# Patient Record
Sex: Male | Born: 1990 | Race: White | Hispanic: No | Marital: Single | Smoking: Current every day smoker
Health system: Southern US, Community
[De-identification: ages and names within clinical notes are randomized; demographics above are authoritative.]

## PROBLEM LIST (undated history)

## (undated) ENCOUNTER — Emergency Department (HOSPITAL_COMMUNITY): Admission: EM | Payer: 59 | Source: Home / Self Care

## (undated) ENCOUNTER — Emergency Department (HOSPITAL_COMMUNITY): Payer: 59

## (undated) ENCOUNTER — Ambulatory Visit: Admission: EM | Payer: Self-pay | Source: Home / Self Care

## (undated) DIAGNOSIS — F419 Anxiety disorder, unspecified: Secondary | ICD-10-CM

## (undated) DIAGNOSIS — F32A Depression, unspecified: Secondary | ICD-10-CM

## (undated) DIAGNOSIS — F4310 Post-traumatic stress disorder, unspecified: Secondary | ICD-10-CM

## (undated) DIAGNOSIS — M419 Scoliosis, unspecified: Secondary | ICD-10-CM

## (undated) DIAGNOSIS — A4902 Methicillin resistant Staphylococcus aureus infection, unspecified site: Secondary | ICD-10-CM

## (undated) HISTORY — DX: Depression, unspecified: F32.A

## (undated) HISTORY — DX: Post-traumatic stress disorder, unspecified: F43.10

## (undated) HISTORY — DX: Anxiety disorder, unspecified: F41.9

---
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
## 2010-10-30 LAB — BASIC METABOLIC PANEL
CO2: 25
Chloride: 104
Glucose, Bld: 104 — ABNORMAL HIGH
Sodium: 140

## 2010-10-30 LAB — RAPID URINE DRUG SCREEN, HOSP PERFORMED
Cocaine: NOT DETECTED
Tetrahydrocannabinol: POSITIVE — AB

## 2010-10-30 LAB — CBC
HCT: 46.1
Hemoglobin: 15.7
MCHC: 34
MCV: 86.6
RBC: 5.33

## 2010-10-30 LAB — DIFFERENTIAL
Basophils Relative: 0
Eosinophils Absolute: 0.3
Eosinophils Relative: 3
Monocytes Absolute: 1.1
Monocytes Relative: 8

## 2011-10-11 ENCOUNTER — Emergency Department (HOSPITAL_COMMUNITY)
Admission: EM | Admit: 2011-10-11 | Discharge: 2011-10-11 | Disposition: A | Discharge status: Home / Self Care | Payer: Medicaid Other | Attending: Emergency Medicine | Admitting: Emergency Medicine

## 2011-10-11 ENCOUNTER — Other Ambulatory Visit: Payer: Self-pay

## 2011-10-11 ENCOUNTER — Encounter (HOSPITAL_COMMUNITY): Payer: Self-pay | Admitting: Physical Medicine and Rehabilitation

## 2011-10-11 ENCOUNTER — Emergency Department (HOSPITAL_COMMUNITY): Payer: Medicaid Other

## 2011-10-11 DIAGNOSIS — R079 Chest pain, unspecified: Secondary | ICD-10-CM | POA: Insufficient documentation

## 2011-10-11 DIAGNOSIS — F17200 Nicotine dependence, unspecified, uncomplicated: Secondary | ICD-10-CM | POA: Insufficient documentation

## 2011-10-11 LAB — CBC WITH DIFFERENTIAL/PLATELET
Basophils Absolute: 0.1 10*3/uL (ref 0.0–0.1)
HCT: 44.3 % (ref 39.0–52.0)
Hemoglobin: 15.6 g/dL (ref 13.0–17.0)
Lymphocytes Relative: 19 % (ref 12–46)
Monocytes Absolute: 1.2 10*3/uL — ABNORMAL HIGH (ref 0.1–1.0)
Neutro Abs: 11.1 10*3/uL — ABNORMAL HIGH (ref 1.7–7.7)
RBC: 4.96 MIL/uL (ref 4.22–5.81)
RDW: 12.8 % (ref 11.5–15.5)
WBC: 16 10*3/uL — ABNORMAL HIGH (ref 4.0–10.5)

## 2011-10-11 LAB — BASIC METABOLIC PANEL
CO2: 29 mEq/L (ref 19–32)
Chloride: 103 mEq/L (ref 96–112)
Creatinine, Ser: 0.94 mg/dL (ref 0.50–1.35)

## 2011-10-11 LAB — RAPID URINE DRUG SCREEN, HOSP PERFORMED
Amphetamines: NOT DETECTED
Benzodiazepines: POSITIVE — AB
Opiates: POSITIVE — AB

## 2011-10-11 LAB — URINALYSIS, ROUTINE W REFLEX MICROSCOPIC
Glucose, UA: NEGATIVE mg/dL
Hgb urine dipstick: NEGATIVE
Ketones, ur: 15 mg/dL — AB
Protein, ur: NEGATIVE mg/dL

## 2011-10-11 LAB — D-DIMER, QUANTITATIVE: D-Dimer, Quant: <0.22 ug/mL-FEU (ref 0.00–0.48)

## 2011-10-11 MED ORDER — HYDROCODONE-ACETAMINOPHEN 5-325 MG PO TABS
2.0000 | ORAL_TABLET | Freq: Once | ORAL | Status: AC
  Administered 2011-10-11: 2 via ORAL
  Filled 2011-10-11: qty 2

## 2011-10-11 NOTE — ED Notes (Signed)
Patient transported to X-ray 

## 2011-10-11 NOTE — ED Notes (Signed)
Discharge instruction given pt verbalized understanding that smoking can cause the pain that he discribed , pt state pain that occurred after coughing episode and he admits to smoking cigarettes and marijuania.

## 2011-10-11 NOTE — ED Notes (Signed)
Pt presents to department for evaluation of diffuse chest pain radiating to back. Onset this morning. States pain becomes worse with movement and palpation. 8/10 pain at the time. Respirations unlabored. Skin warm and dry. Pt is alert and oriented x4. No signs of distress noted.

## 2011-10-11 NOTE — ED Provider Notes (Signed)
History     CSN: [id number]  Arrival date & time 10/11/11  1457   First MD Initiated Contact with Patient 10/11/11 1923      Chief Complaint  Patient presents with  . Chest Pain    (Consider location/radiation/quality/duration/timing/severity/associated sxs/prior treatment) HPI Comments: Roger [name] 21 y.o. male   The chief complaint is: Patient presents with:   Chest Pain   The patient has medical history significant for:   No past medical history on file.  Patient presents with mid sternal CP that began last night. Patient states that the pain is mid sternal, 9/10, and worse when he lays down. Associated symptoms include transmission to the back and left shoulder pain. Patient states that he drinks alcohol and smokes marijuana regularly, but denies cocaine, other illicit drugs, or IV drug use. Denies fever or chills. Denies cough, congestion, or rhinorrhea, Denies NVD or abdominal pain.       The history is provided by the patient.    No past medical history on file.  No past surgical history on file.  History reviewed. No pertinent family history.  History  Substance Use Topics  . Smoking status: Current Everyday Smoker -- 1.0 packs/day    Types: Cigarettes  . Smokeless tobacco: Not on file  . Alcohol Use: Yes      Review of Systems  Constitutional: Negative for fever and chills.  HENT: Negative for congestion and rhinorrhea.   Respiratory: Negative for cough and shortness of breath.   Cardiovascular: Positive for chest pain.  Gastrointestinal: Negative for nausea, vomiting, abdominal pain and diarrhea.  Musculoskeletal: Positive for gait problem.  All other systems reviewed and are negative.    Allergies  Review of patient's allergies indicates no known allergies.  Home Medications  No current outpatient prescriptions on file.  BP 128/79  Pulse 60  Temp 98.5 F (36.9 C) (Oral)  Resp 19  SpO2 100%  Physical Exam  Nursing note and  vitals reviewed. Constitutional: He appears well-developed and well-nourished. No distress.  HENT:  Head: Normocephalic and atraumatic.  Mouth/Throat: Oropharynx is clear and moist.  Eyes: Conjunctivae and EOM are normal. Pupils are equal, round, and reactive to light. No scleral icterus.  Neck: Normal range of motion. Neck supple.  Cardiovascular: Normal rate, regular rhythm and normal heart sounds.   Pulmonary/Chest: Effort normal and breath sounds normal.       CP is reproducible on exam.  Abdominal: Soft. Bowel sounds are normal. There is no tenderness.  Neurological: He is alert.  Skin: Skin is warm and dry.    ED Course  Procedures (including critical care time)  Labs Reviewed - No data to display Dg Chest 2 View  10/11/2011  *RADIOLOGY REPORT*  Clinical Data: Pleuritic chest pain.  CHEST - 2 VIEW  Comparison:  05/14/2009  Findings:  The heart size and mediastinal contours are within normal limits.  Both lungs are clear.  No evidence of pneumothorax or pleural effusion.  The visualized skeletal structures are unremarkable.  IMPRESSION: No active disease.   Original Report Authenticated By: Danae Orleans, M.D.      1. Chest pain       MDM  Patient presented with CP that began last night. Pain radiated to back and left shoulder. Patient given pain medication in ED, with improvement. CBC: unremarkable BMP: unremarkable D-dimer:negative CXR: negative. EKG: unremarkable. Patient reassessed and CP improved. This pain is most due to drug use. Patient counseled on not taking pills that  are not prescribed to him. Patient discharged.  No red flags for PE or pneumothorax. Return precautions given verbally and in discharge summary.        Pixie Casino, PA-C 10/11/11 2229

## 2011-10-12 NOTE — ED Provider Notes (Signed)
Medical screening examination/treatment/procedure(s) were conducted as a shared visit with non-physician practitioner(s) and myself.  I personally evaluated the patient during the encounter  Substernal chest pain since last night. Worse with lying down. Radiates to back. Lungs clear, heart regular. Atypical for ACS, chest x-ray negative, d-dimer negative.  Glynn Octave, MD 10/12/11 [phone]

## 2012-08-07 ENCOUNTER — Encounter (HOSPITAL_COMMUNITY): Payer: Self-pay | Admitting: Emergency Medicine

## 2012-08-07 ENCOUNTER — Emergency Department (HOSPITAL_COMMUNITY)
Admission: EM | Admit: 2012-08-07 | Discharge: 2012-08-08 | Disposition: A | Discharge status: Home / Self Care | Payer: Self-pay | Attending: Emergency Medicine | Admitting: Emergency Medicine

## 2012-08-07 DIAGNOSIS — M25512 Pain in left shoulder: Secondary | ICD-10-CM

## 2012-08-07 DIAGNOSIS — Z87828 Personal history of other (healed) physical injury and trauma: Secondary | ICD-10-CM | POA: Insufficient documentation

## 2012-08-07 DIAGNOSIS — M25519 Pain in unspecified shoulder: Secondary | ICD-10-CM | POA: Insufficient documentation

## 2012-08-07 DIAGNOSIS — Z87891 Personal history of nicotine dependence: Secondary | ICD-10-CM | POA: Insufficient documentation

## 2012-08-07 NOTE — ED Notes (Signed)
Pt c/o left shoulder pain. States he has hx of shoulder dislocation and pain since December 2013.

## 2012-08-08 MED ORDER — IBUPROFEN 800 MG PO TABS
800.0000 mg | ORAL_TABLET | Freq: Once | ORAL | Status: AC
  Administered 2012-08-08: 800 mg via ORAL
  Filled 2012-08-08: qty 1

## 2012-08-08 NOTE — ED Notes (Signed)
Left shoulder has been injured several times - no new injury - has tried tylenol and advil without relief.

## 2012-08-08 NOTE — ED Provider Notes (Signed)
   History    CSN: [id number] Arrival date & time 08/07/12  2348  First MD Initiated Contact with Patient 08/08/12 0010     Chief Complaint  Patient presents with  . Shoulder Pain   (Consider location/radiation/quality/duration/timing/severity/associated sxs/prior Treatment) HPI HPI Comments: Roger [name] is a 22 y.o. male who presents to the Emergency Department complaining of left shoulder pain. He has a h/o dislocation of the left shoulder and is scheduled to have PT in [place] next week. The shoulder hurts with movement and in certain positions. He is able to shrug his shoulders but to hold his hand above his head is painful.   History reviewed. No pertinent past medical history. History reviewed. No pertinent past surgical history. No family history on file. History  Substance Use Topics  . Smoking status: Former Smoker -- 1.00 packs/day    Types: Cigarettes  . Smokeless tobacco: Not on file  . Alcohol Use: Yes    Review of Systems  Constitutional: Negative for fever.       10 Systems reviewed and are negative for acute change except as noted in the HPI.  HENT: Negative for congestion.   Eyes: Negative for discharge and redness.  Respiratory: Negative for cough and shortness of breath.   Cardiovascular: Negative for chest pain.  Gastrointestinal: Negative for vomiting and abdominal pain.  Musculoskeletal: Negative for back pain.       Shoulder pain  Skin: Negative for rash.  Neurological: Negative for syncope, numbness and headaches.  Psychiatric/Behavioral:       No behavior change.    Allergies  Review of patient's allergies indicates no known allergies.  Home Medications  No current outpatient prescriptions on file. BP 130/77  Pulse 86  Temp(Src) 97.9 F (36.6 C) (Oral)  Resp 20  Ht 5\' 6"  (1.676 m)  Wt 160 lb (72.576 kg)  BMI 25.84 kg/m2  SpO2 100% Physical Exam  Nursing note and vitals reviewed. Constitutional: He appears well-developed and  well-nourished.  Awake, alert, nontoxic appearance.  HENT:  Head: Normocephalic and atraumatic.  Eyes: EOM are normal. Pupils are equal, round, and reactive to light.  Neck: Neck supple.  Cardiovascular: Normal rate and intact distal pulses.   Pulmonary/Chest: Effort normal and breath sounds normal. He exhibits no tenderness.  Abdominal: Soft. Bowel sounds are normal. There is no tenderness. There is no rebound.  Musculoskeletal: He exhibits no tenderness.  Baseline ROM, no obvious new focal weakness.Left shoulder with tenderness to palpation. FROM with discomfort.   Neurological:  Mental status and motor strength appears baseline for patient and situation.  Skin: No rash noted.  Psychiatric: He has a normal mood and affect.    ED Course  Procedures (including critical care time)   MDM  Patient with shoulder pain c/w torn rotator cuff. He is scheduled for PT this coming week. Discussed the use of ibuprofen for pain management. Pt stable in ED with no significant deterioration in condition.The patient appears reasonably screened and/or stabilized for discharge and I doubt any other medical condition or other [facility] requiring further screening, evaluation, or treatment in the ED at this time prior to discharge.  MDM Reviewed: nursing note and vitals     Nicoletta Dress. Colon Branch, MD 08/08/12 [phone]

## 2012-08-08 NOTE — ED Notes (Signed)
Patient sleeping soundly - awakened to give him the pain medication as ordered

## 2012-08-10 ENCOUNTER — Emergency Department (HOSPITAL_COMMUNITY)
Admission: EM | Admit: 2012-08-10 | Discharge: 2012-08-10 | Disposition: A | Discharge status: Home / Self Care | Payer: BC Managed Care – PPO | Attending: Emergency Medicine | Admitting: Emergency Medicine

## 2012-08-10 ENCOUNTER — Emergency Department (HOSPITAL_COMMUNITY): Payer: BC Managed Care – PPO

## 2012-08-10 ENCOUNTER — Encounter (HOSPITAL_COMMUNITY): Payer: Self-pay | Admitting: *Deleted

## 2012-08-10 DIAGNOSIS — M21829 Other specified acquired deformities of unspecified upper arm: Secondary | ICD-10-CM

## 2012-08-10 DIAGNOSIS — Y929 Unspecified place or not applicable: Secondary | ICD-10-CM | POA: Insufficient documentation

## 2012-08-10 DIAGNOSIS — S43016A Anterior dislocation of unspecified humerus, initial encounter: Secondary | ICD-10-CM | POA: Insufficient documentation

## 2012-08-10 DIAGNOSIS — S43005A Unspecified dislocation of left shoulder joint, initial encounter: Secondary | ICD-10-CM

## 2012-08-10 DIAGNOSIS — W010XXA Fall on same level from slipping, tripping and stumbling without subsequent striking against object, initial encounter: Secondary | ICD-10-CM | POA: Insufficient documentation

## 2012-08-10 DIAGNOSIS — Y93E1 Activity, personal bathing and showering: Secondary | ICD-10-CM | POA: Insufficient documentation

## 2012-08-10 DIAGNOSIS — Z87891 Personal history of nicotine dependence: Secondary | ICD-10-CM | POA: Insufficient documentation

## 2012-08-10 MED ORDER — MIDAZOLAM HCL 5 MG/5ML IJ SOLN
4.0000 mg | Freq: Once | INTRAMUSCULAR | Status: AC
  Administered 2012-08-10: 2 mg via INTRAVENOUS
  Filled 2012-08-10: qty 5

## 2012-08-10 MED ORDER — PROPOFOL 10 MG/ML IV BOLUS
INTRAVENOUS | Status: AC
  Administered 2012-08-10: 50 mg via INTRAVENOUS
  Filled 2012-08-10: qty 1

## 2012-08-10 MED ORDER — NAPROXEN 500 MG PO TABS: 500.0000 mg | ORAL_TABLET | Freq: Two times a day (BID) | ORAL | Status: DC

## 2012-08-10 MED ORDER — PROPOFOL 10 MG/ML IV BOLUS
1.0000 mg/kg | Freq: Once | INTRAVENOUS | Status: AC
  Administered 2012-08-10: 50 mg via INTRAVENOUS

## 2012-08-10 MED ORDER — TRAMADOL HCL 50 MG PO TABS: 50.0000 mg | ORAL_TABLET | Freq: Four times a day (QID) | ORAL | Status: DC | PRN

## 2012-08-10 MED ORDER — FENTANYL CITRATE 0.05 MG/ML IJ SOLN
100.0000 ug | Freq: Once | INTRAMUSCULAR | Status: AC
  Administered 2012-08-10: 100 ug via INTRAVENOUS
  Filled 2012-08-10: qty 2

## 2012-08-10 NOTE — ED Notes (Signed)
MD at the bedside to discuss follow up xray, pt talking,vitals are stable

## 2012-08-10 NOTE — ED Notes (Signed)
Pt states slipped in shower & complaining of left shoulder being dislocated. 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

## 2012-08-10 NOTE — ED Notes (Signed)
Pt ambulatory out of the dept with girlfriend, no complaints

## 2012-08-10 NOTE — ED Provider Notes (Signed)
History    CSN: [id number] Arrival date & time 08/10/12  0107  First MD Initiated Contact with Patient 08/10/12 0110     Chief Complaint  Patient presents with  . Shoulder Injury   (Consider location/radiation/quality/duration/timing/severity/associated sxs/prior Treatment) HPI Comments:  22 year old male who states that he has had a total of 4 shoulder dislocations in the past including this evening. He endorses being in the shower this evening, slipped trying to get out and fell onto his left shoulder causing acute onset of pain and deformity which has been persistent, severe, worse with range of motion but not associated with numbness or tingling of the fingers. Never had surgery on the shoulder but has required procedural sedation for reduction in the past.  Patient is a 22 y.o. male presenting with shoulder injury. The history is provided by the patient.  Shoulder Injury   History reviewed. No pertinent past medical history. History reviewed. No pertinent past surgical history. History reviewed. No pertinent family history. History  Substance Use Topics  . Smoking status: Former Smoker -- 1.00 packs/day    Types: Cigarettes  . Smokeless tobacco: Not on file  . Alcohol Use: Yes    Review of Systems  Musculoskeletal: Positive for joint swelling.  Skin: Negative for wound.  Neurological: Negative for weakness and numbness.    Allergies  Review of patient's allergies indicates no known allergies.  Home Medications   Current Outpatient Rx  Name  Route  Sig  Dispense  Refill  . naproxen (NAPROSYN) 500 MG tablet   Oral   Take 1 tablet (500 mg total) by mouth 2 (two) times daily with a meal.   30 tablet   0   . traMADol (ULTRAM) 50 MG tablet   Oral   Take 1 tablet (50 mg total) by mouth every 6 (six) hours as needed for pain.   15 tablet   0    BP 118/69  Pulse 58  Temp(Src) 98 F (36.7 C) (Oral)  Resp 18  Ht 5\' 6"  (1.676 m)  Wt 160 lb (72.576 kg)  BMI  25.84 kg/m2  SpO2 96% Physical Exam  Nursing note and vitals reviewed. Constitutional: He appears well-developed and well-nourished. No distress.  HENT:  Head: Normocephalic and atraumatic.  Mouth/Throat: Oropharynx is clear and moist. No oropharyngeal exudate.  Eyes: Conjunctivae and EOM are normal. Pupils are equal, round, and reactive to light. Right eye exhibits no discharge. Left eye exhibits no discharge. No scleral icterus.  Neck: Normal range of motion. Neck supple. No JVD present. No thyromegaly present.  Cardiovascular: Normal rate, regular rhythm, normal heart sounds and intact distal pulses.  Exam reveals no gallop and no friction rub.   No murmur heard. Pulmonary/Chest: Effort normal and breath sounds normal. No respiratory distress. He has no wheezes. He has no rales.  Abdominal: Soft. Bowel sounds are normal. He exhibits no distension and no mass. There is no tenderness.  Musculoskeletal: Normal range of motion. He exhibits tenderness (Moderate to severe tenderness associated with deformity of the left shoulder, decreased range of motion secondary to severe pain, normal pulses and sensation at the left hand). He exhibits no edema.  Lymphadenopathy:    He has no cervical adenopathy.  Neurological: He is alert. Coordination normal.  Skin: Skin is warm and dry. No rash noted. No erythema.  Psychiatric: He has a normal mood and affect. His behavior is normal.    ED Course  Procedures (including critical care time) Labs Reviewed - No  data to display Dg Shoulder Left  08/10/2012   *RADIOLOGY REPORT*  Clinical Data: Postreduction left shoulder.  LEFT SHOULDER - 2+ VIEW  Comparison: 08/10/2012 at 0130 hours.  Findings: Interval reduction of previously left shoulder dislocation.  Hill-Sachs deformity is present.  IMPRESSION: Interval relocation of the left shoulder.  Hill-Sachs deformity demonstrated.   Original Report Authenticated By: Burman Nieves, M.D.   Dg Shoulder  Left  08/10/2012   *RADIOLOGY REPORT*  Clinical Data: Pain and deformity of the left shoulder.  Previous dislocations.  LEFT SHOULDER - 2+ VIEW  Comparison: 02/02/2012  Findings: The anterior dislocation of the left shoulder with Hill- Sachs deformity of the lateral humeral head.  No focal bone lesion demonstrated.  No displaced fractures.  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   Original Report Authenticated By: Burman Nieves, M.D.   1. Dislocation of left shoulder joint, initial encounter   2. Hill Sachs deformity     MDM  The patient appears uncomfortable, he has a likely dislocated left shoulder, this is recurrent, he refuses to allow me to reduce the shoulder manually without sedation, pain medication ordered, x-ray pending.  Pt required procedural sedation for reduction  Procedure Note:      Dislocation Reduction  Date, Time: August 10 2012  2:48 AM  Indication: Dislocation of  the left shoulder(s) The patient has expressed understanding of the procedure being preformed Risks Benefits and alternatives of the procedure were give to the patient Written Consent obtained from patient Imaging results available showing:  Left anterior shoulder dislocation with Hill-Sachs deformity Site Marked yes  Time out called immediately prior to procedure Patient was placed in semifowler position Medicines used 2 mg of Versed, 50 mg of propofol Method used traction, countertraction Immobilization applied post reduction Post reduction radiographs successful reduction of dislocation Patient tolerated procedure without any immediate complications. Approximate time of physician involvement in procedure 15 minutes Well tolerated by patient.  Procedural sedation Performed by: Vida Roller Consent: Verbal consent obtained. Risks and benefits: risks, benefits and alternatives were discussed Required items: required blood products, implants, devices, and  special equipment available Patient identity confirmed: arm band and provided demographic data Time out: Immediately prior to procedure a "time out" was called to verify the correct patient, procedure, equipment, support staff and site/side marked as required.  Sedation type: moderate (conscious) sedation NPO time confirmed and considedered  Sedatives: PROPOFOL, Versed  Physician Time at Bedside: 15 minutes  Vitals: Vital signs were monitored during sedation. Cardiac Monitor, pulse oximeter Patient tolerance: Patient tolerated the procedure well with no immediate complications. Comments: Pt with uneventful recovered. Returned to pre-procedural sedation baseline    Meds given in ED:  Medications  fentaNYL (SUBLIMAZE) injection 100 mcg (100 mcg Intravenous Given 08/10/12 0129)  midazolam (VERSED) 5 MG/5ML injection 4 mg (2 mg Intravenous Given 08/10/12 0224)  propofol (DIPRIVAN) 10 mg/mL bolus/IV push 72.6 mg (50 mg Intravenous New Bag/Given 08/10/12 0229)    New Prescriptions   NAPROXEN (NAPROSYN) 500 MG TABLET    Take 1 tablet (500 mg total) by mouth 2 (two) times daily with a meal.   TRAMADOL (ULTRAM) 50 MG TABLET    Take 1 tablet (50 mg total) by mouth every 6 (six) hours as needed for pain.        Vida Roller, MD 08/10/12 [phone]

## 2012-08-10 NOTE — ED Notes (Signed)
Pt drinking ice water with out difficulty. Per girlfriend pt is talking normal. Pt denies pain.

## 2012-11-19 ENCOUNTER — Ambulatory Visit: Payer: BC Managed Care – PPO | Admitting: Family Medicine

## 2013-01-06 ENCOUNTER — Ambulatory Visit: Payer: BC Managed Care – PPO | Admitting: Family Medicine

## 2013-03-03 ENCOUNTER — Emergency Department (HOSPITAL_COMMUNITY)
Admission: EM | Admit: 2013-03-03 | Discharge: 2013-03-03 | Disposition: A | Discharge status: Home / Self Care | Payer: BC Managed Care – PPO | Attending: Emergency Medicine | Admitting: Emergency Medicine

## 2013-03-03 ENCOUNTER — Encounter (HOSPITAL_COMMUNITY): Payer: Self-pay | Admitting: Emergency Medicine

## 2013-03-03 DIAGNOSIS — F17200 Nicotine dependence, unspecified, uncomplicated: Secondary | ICD-10-CM | POA: Insufficient documentation

## 2013-03-03 DIAGNOSIS — L03317 Cellulitis of buttock: Principal | ICD-10-CM

## 2013-03-03 DIAGNOSIS — Z8614 Personal history of Methicillin resistant Staphylococcus aureus infection: Secondary | ICD-10-CM | POA: Insufficient documentation

## 2013-03-03 DIAGNOSIS — L0231 Cutaneous abscess of buttock: Secondary | ICD-10-CM | POA: Insufficient documentation

## 2013-03-03 HISTORY — DX: Methicillin resistant Staphylococcus aureus infection, unspecified site: A49.02

## 2013-03-03 MED ORDER — LIDOCAINE HCL (PF) 2 % IJ SOLN
INTRAMUSCULAR | Status: AC
  Filled 2013-03-03: qty 10

## 2013-03-03 MED ORDER — SULFAMETHOXAZOLE-TRIMETHOPRIM 800-160 MG PO TABS: 2.0000 | ORAL_TABLET | Freq: Two times a day (BID) | ORAL | Status: DC

## 2013-03-03 MED ORDER — HYDROCODONE-ACETAMINOPHEN 5-325 MG PO TABS: 1.0000 | ORAL_TABLET | Freq: Four times a day (QID) | ORAL | Status: DC | PRN

## 2013-03-03 NOTE — ED Notes (Addendum)
Patient c/o abscess to left buttock x4 days. Per patient bloody drainage. Denies any fevers.

## 2013-03-03 NOTE — Discharge Instructions (Signed)
Return here 2 days for recheck.  Keep area covered and clean around the area.

## 2013-03-03 NOTE — ED Provider Notes (Signed)
CSN: [id number]     Arrival date & time 03/03/13  1331 History   First MD Initiated Contact with Patient 03/03/13 1347     Chief Complaint  Patient presents with  . Abscess   (Consider location/radiation/quality/duration/timing/severity/associated sxs/prior Treatment) HPI Patient presents emergency department with an abscess to his left buttocks.  The patient, states he noted.  Area 4 days, ago, and over the last 3 days, more swollen.  It started draining a yellowish, bloody material, yesterday.  Patient, states, that he did not take any medications prior to arrival.  Patient, states, that he's had a history of MRSA in the past.  Patient denies fever, nausea, vomiting, back pain, diarrhea, or weakness.  The patient, states, that nothing seems to make his condition, better or palpation and sitting make his pain, worse Past Medical History  Diagnosis Date  . MRSA (methicillin resistant Staphylococcus aureus)    History reviewed. No pertinent past surgical history. Family History  Problem Relation Age of Onset  . Diabetes Mother    History  Substance Use Topics  . Smoking status: Current Every Day Smoker -- 1.00 packs/day for 13 years    Types: Cigarettes  . Smokeless tobacco: Never Used  . Alcohol Use: Yes     Comment: occasionally    Review of Systems All other systems negative except as documented in the HPI. All pertinent positives and negatives as reviewed in the HPI. Allergies  Review of patient's allergies indicates no known allergies.  Home Medications  No current outpatient prescriptions on file. BP 135/86  Pulse 80  Temp(Src) 98.1 F (36.7 C) (Oral)  Resp 18  Ht 5\' 6"  (1.676 m)  Wt 169 lb (76.658 kg)  BMI 27.29 kg/m2  SpO2 99% Physical Exam  Nursing note and vitals reviewed. Constitutional: He appears well-developed and well-nourished. No distress.  HENT:  Head: Normocephalic and atraumatic.  Eyes: Pupils are equal, round, and reactive to light.  Neck: Normal  range of motion. Neck supple.  Pulmonary/Chest: Effort normal.  Neurological: He is alert.  Skin: Skin is warm and dry. No rash noted. No erythema.       ED Course  Procedures (including critical care time) INCISION AND DRAINAGE Performed by: Carlyle DollyLAWYER,Rebbeca Sheperd W Consent: Verbal consent obtained. Risks and benefits: risks, benefits and alternatives were discussed Type: abscess  Body area: Left buttock mid  Anesthesia: local infiltration  Incision was made with a scalpel.  Local anesthetic: lidocaine 2*% without epinephrine  Anesthetic total: 10 ml  Complexity: complex Blunt dissection to break up loculations  Drainage: purulent  Drainage amount: Large   Packing material: 1/4 in iodoform gauze  Patient tolerance: Patient tolerated the procedure well with no immediate complications.    Patient is advised to return here in 2 days for recheck.  To keep the area covered, clean and dry    Carlyle DollyChristopher W Rossanna Spitzley, PA-C 03/03/13 1439

## 2013-03-04 NOTE — ED Provider Notes (Signed)
Medical screening examination/treatment/procedure(s) were performed by non-physician practitioner and as supervising physician I was immediately available for consultation/collaboration.  EKG Interpretation   None         Shelda JakesScott W. Adalaide Jaskolski, MD 03/04/13 [phone]

## 2013-06-15 ENCOUNTER — Emergency Department (HOSPITAL_COMMUNITY): Admission: EM | Admit: 2013-06-15 | Discharge: 2013-06-15 | Discharge status: Against Medical Advice | Payer: Self-pay

## 2013-06-20 ENCOUNTER — Encounter (HOSPITAL_COMMUNITY): Payer: Self-pay | Admitting: Emergency Medicine

## 2013-06-20 ENCOUNTER — Emergency Department (HOSPITAL_COMMUNITY)
Admission: EM | Admit: 2013-06-20 | Discharge: 2013-06-20 | Disposition: A | Discharge status: Home / Self Care | Payer: Self-pay | Attending: Emergency Medicine | Admitting: Emergency Medicine

## 2013-06-20 ENCOUNTER — Emergency Department (HOSPITAL_COMMUNITY): Payer: Self-pay

## 2013-06-20 DIAGNOSIS — Y939 Activity, unspecified: Secondary | ICD-10-CM | POA: Insufficient documentation

## 2013-06-20 DIAGNOSIS — S40019A Contusion of unspecified shoulder, initial encounter: Secondary | ICD-10-CM | POA: Insufficient documentation

## 2013-06-20 DIAGNOSIS — F17200 Nicotine dependence, unspecified, uncomplicated: Secondary | ICD-10-CM | POA: Insufficient documentation

## 2013-06-20 DIAGNOSIS — S40012A Contusion of left shoulder, initial encounter: Secondary | ICD-10-CM

## 2013-06-20 DIAGNOSIS — Z8614 Personal history of Methicillin resistant Staphylococcus aureus infection: Secondary | ICD-10-CM | POA: Insufficient documentation

## 2013-06-20 DIAGNOSIS — W1809XA Striking against other object with subsequent fall, initial encounter: Secondary | ICD-10-CM | POA: Insufficient documentation

## 2013-06-20 DIAGNOSIS — Z792 Long term (current) use of antibiotics: Secondary | ICD-10-CM | POA: Insufficient documentation

## 2013-06-20 DIAGNOSIS — S20229A Contusion of unspecified back wall of thorax, initial encounter: Secondary | ICD-10-CM | POA: Insufficient documentation

## 2013-06-20 DIAGNOSIS — Y929 Unspecified place or not applicable: Secondary | ICD-10-CM | POA: Insufficient documentation

## 2013-06-20 MED ORDER — CYCLOBENZAPRINE HCL 10 MG PO TABS: 10.0000 mg | ORAL_TABLET | Freq: Two times a day (BID) | ORAL | Status: DC | PRN

## 2013-06-20 MED ORDER — IBUPROFEN 800 MG PO TABS: 800.0000 mg | ORAL_TABLET | Freq: Three times a day (TID) | ORAL | Status: DC

## 2013-06-20 NOTE — ED Notes (Signed)
Fell 1 week ago, pain low back and lt shoulder.

## 2013-06-20 NOTE — ED Notes (Signed)
Pt with left shoulder and back pain since a fall last week ago, pt landed straight back on back per pt and hit back of head, pt denies LOC, pt also states hx of left shoulder dislocation, pt denies taking anything for the pain but used a sling for first couple of days

## 2013-06-20 NOTE — ED Provider Notes (Signed)
Medical screening examination/treatment/procedure(s) were performed by non-physician practitioner and as supervising physician I was immediately available for consultation/collaboration.  Tessy Pawelski T Isidora Laham, MD 06/20/13 2314 

## 2013-06-20 NOTE — ED Provider Notes (Signed)
CSN: [id number]     Arrival date & time 06/20/13  1957 History   First MD Initiated Contact with Patient 06/20/13 2051     Chief Complaint  Patient presents with  . Fall     (Consider location/radiation/quality/duration/timing/severity/associated sxs/prior Treatment) Patient is a 23 y.o. male presenting with fall. The history is provided by the patient. No language interpreter was used.  Fall This is a new problem. The current episode started today. The problem occurs constantly. The problem has been gradually worsening. Associated symptoms include myalgias. Pertinent negatives include no joint swelling. Nothing aggravates the symptoms. He has tried nothing for the symptoms. The treatment provided moderate relief.  Pt fell one week ago.   Pt hit low back.   Pt complains of pain in his back and in his shoulder.    Past Medical History  Diagnosis Date  . MRSA (methicillin resistant Staphylococcus aureus)    History reviewed. No pertinent past surgical history. Family History  Problem Relation Age of Onset  . Diabetes Mother    History  Substance Use Topics  . Smoking status: Current Every Day Smoker -- 1.00 packs/day for 13 years    Types: Cigarettes  . Smokeless tobacco: Never Used  . Alcohol Use: Yes     Comment: occasionally    Review of Systems  Musculoskeletal: Positive for myalgias. Negative for joint swelling.  All other systems reviewed and are negative.     Allergies  Review of patient's allergies indicates no known allergies.  Home Medications   Prior to Admission medications   Medication Sig Start Date End Date Taking? Authorizing Provider  HYDROcodone-acetaminophen (NORCO/VICODIN) 5-325 MG per tablet Take 1 tablet by mouth every 6 (six) hours as needed for moderate pain. 03/03/13   Jamesetta Orleanshristopher W Lawyer, PA-C  sulfamethoxazole-trimethoprim (SEPTRA DS) 800-160 MG per tablet Take 2 tablets by mouth 2 (two) times daily. 03/03/13   Jamesetta Orleanshristopher W Lawyer, PA-C   BP  131/83  Pulse 92  Temp(Src) 99.2 F (37.3 C) (Oral)  Resp 24  Ht 5\' 6"  (1.676 m)  Wt 170 lb (77.111 kg)  BMI 27.45 kg/m2  SpO2 97% Physical Exam  Nursing note and vitals reviewed. Constitutional: He is oriented to person, place, and time. He appears well-developed and well-nourished.  HENT:  Head: Normocephalic and atraumatic.  Eyes: EOM are normal. Pupils are equal, round, and reactive to light.  Neck: Normal range of motion.  Cardiovascular: Normal rate.   Pulmonary/Chest: Effort normal.  Abdominal: Soft. He exhibits no distension.  Musculoskeletal:  Tender left shoulder.  Decreased range of motion Tender lower lumbar and sacral spine  Neurological: He is alert and oriented to person, place, and time.  Skin: Skin is warm.  Psychiatric: He has a normal mood and affect.    ED Course  Procedures (including critical care time) Labs Review Labs Reviewed - No data to display  Imaging Review No results found.   EKG Interpretation None      MDM   Final diagnoses:  Contusion, back  Contusion of left shoulder    Ibuprofen flexeril    Elson AreasLeslie K Jennika Ringgold, PA-C 06/20/13 2205

## 2013-06-20 NOTE — Discharge Instructions (Signed)

## 2013-06-28 ENCOUNTER — Telehealth: Payer: Self-pay | Admitting: Family Medicine

## 2013-06-28 NOTE — Telephone Encounter (Signed)
APPT MADE

## 2013-06-29 ENCOUNTER — Ambulatory Visit: Payer: BC Managed Care – PPO | Admitting: Physician Assistant

## 2014-10-23 ENCOUNTER — Emergency Department (HOSPITAL_COMMUNITY): Payer: Self-pay

## 2014-10-23 ENCOUNTER — Emergency Department (HOSPITAL_COMMUNITY)
Admission: EM | Admit: 2014-10-23 | Discharge: 2014-10-23 | Disposition: A | Discharge status: Home / Self Care | Payer: Self-pay | Attending: Emergency Medicine | Admitting: Emergency Medicine

## 2014-10-23 ENCOUNTER — Encounter (HOSPITAL_COMMUNITY): Payer: Self-pay

## 2014-10-23 DIAGNOSIS — Y9389 Activity, other specified: Secondary | ICD-10-CM | POA: Insufficient documentation

## 2014-10-23 DIAGNOSIS — Y92524 Gas station as the place of occurrence of the external cause: Secondary | ICD-10-CM | POA: Insufficient documentation

## 2014-10-23 DIAGNOSIS — Z720 Tobacco use: Secondary | ICD-10-CM | POA: Insufficient documentation

## 2014-10-23 DIAGNOSIS — M419 Scoliosis, unspecified: Secondary | ICD-10-CM | POA: Insufficient documentation

## 2014-10-23 DIAGNOSIS — S39012A Strain of muscle, fascia and tendon of lower back, initial encounter: Secondary | ICD-10-CM | POA: Insufficient documentation

## 2014-10-23 DIAGNOSIS — Z8614 Personal history of Methicillin resistant Staphylococcus aureus infection: Secondary | ICD-10-CM | POA: Insufficient documentation

## 2014-10-23 DIAGNOSIS — Y998 Other external cause status: Secondary | ICD-10-CM | POA: Insufficient documentation

## 2014-10-23 DIAGNOSIS — S4992XA Unspecified injury of left shoulder and upper arm, initial encounter: Secondary | ICD-10-CM | POA: Insufficient documentation

## 2014-10-23 MED ORDER — IBUPROFEN 600 MG PO TABS: 600.0000 mg | ORAL_TABLET | Freq: Four times a day (QID) | ORAL | Status: DC | PRN

## 2014-10-23 MED ORDER — TRAMADOL HCL 50 MG PO TABS: ORAL_TABLET | ORAL | Status: DC

## 2014-10-23 MED ORDER — METHOCARBAMOL 500 MG PO TABS: ORAL_TABLET | ORAL | Status: DC
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
## 2014-10-25 ENCOUNTER — Emergency Department (HOSPITAL_COMMUNITY): Payer: Self-pay

## 2014-10-25 ENCOUNTER — Encounter (HOSPITAL_COMMUNITY): Payer: Self-pay | Admitting: Emergency Medicine

## 2014-10-25 ENCOUNTER — Emergency Department (HOSPITAL_COMMUNITY)
Admission: EM | Admit: 2014-10-25 | Discharge: 2014-10-25 | Payer: Self-pay | Attending: Emergency Medicine | Admitting: Emergency Medicine

## 2014-10-25 DIAGNOSIS — Z720 Tobacco use: Secondary | ICD-10-CM | POA: Insufficient documentation

## 2014-10-25 DIAGNOSIS — S0990XA Unspecified injury of head, initial encounter: Secondary | ICD-10-CM | POA: Insufficient documentation

## 2014-10-25 DIAGNOSIS — Z8614 Personal history of Methicillin resistant Staphylococcus aureus infection: Secondary | ICD-10-CM | POA: Insufficient documentation

## 2014-10-25 DIAGNOSIS — Y9289 Other specified places as the place of occurrence of the external cause: Secondary | ICD-10-CM | POA: Insufficient documentation

## 2014-10-25 DIAGNOSIS — Y998 Other external cause status: Secondary | ICD-10-CM | POA: Insufficient documentation

## 2014-10-25 DIAGNOSIS — Y9389 Activity, other specified: Secondary | ICD-10-CM | POA: Insufficient documentation

## 2014-10-25 DIAGNOSIS — S0101XA Laceration without foreign body of scalp, initial encounter: Secondary | ICD-10-CM | POA: Insufficient documentation

## 2014-10-25 DIAGNOSIS — S0191XA Laceration without foreign body of unspecified part of head, initial encounter: Secondary | ICD-10-CM

## 2014-10-25 MED ORDER — BACITRACIN ZINC 500 UNIT/GM EX OINT
TOPICAL_OINTMENT | CUTANEOUS | Status: AC
  Administered 2014-10-25: 1
  Filled 2014-10-25: qty 0.9

## 2014-10-25 MED ORDER — LIDOCAINE-EPINEPHRINE (PF) 1 %-1:200000 IJ SOLN
10.0000 mL | Freq: Once | INTRAMUSCULAR | Status: AC
  Administered 2014-10-25: 10 mL via INTRADERMAL
  Filled 2014-10-25: qty 10

## 2014-10-25 MED ORDER — LIDOCAINE-EPINEPHRINE 1 %-1:200000 IJ SOLN
INTRAMUSCULAR | Status: AC
Start: 2014-10-25 — End: 2014-10-25
  Administered 2014-10-25: 10 mL via INTRADERMAL
  Filled 2014-10-25: qty 10

## 2014-10-25 NOTE — ED Notes (Signed)
Wound cleaned.
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
## 2014-10-25 NOTE — ED Notes (Signed)
Pt resting with eyes shut.  Easily arrousses.  Pt alert and oriented. No distress.  Bleeding controlled to head.

## 2014-10-26 ENCOUNTER — Telehealth: Payer: Self-pay | Admitting: *Deleted

## 2014-10-26 NOTE — Telephone Encounter (Signed)
LMTCB to schedule ER Follow up

## 2015-04-18 ENCOUNTER — Emergency Department (HOSPITAL_COMMUNITY)
Admission: EM | Admit: 2015-04-18 | Discharge: 2015-04-18 | Disposition: A | Discharge status: Home / Self Care | Payer: No Typology Code available for payment source | Attending: Emergency Medicine | Admitting: Emergency Medicine

## 2015-04-18 ENCOUNTER — Encounter (HOSPITAL_COMMUNITY): Payer: Self-pay | Admitting: *Deleted

## 2015-04-18 DIAGNOSIS — S61412D Laceration without foreign body of left hand, subsequent encounter: Secondary | ICD-10-CM | POA: Insufficient documentation

## 2015-04-18 DIAGNOSIS — F17210 Nicotine dependence, cigarettes, uncomplicated: Secondary | ICD-10-CM | POA: Insufficient documentation

## 2015-04-18 DIAGNOSIS — Z8614 Personal history of Methicillin resistant Staphylococcus aureus infection: Secondary | ICD-10-CM | POA: Insufficient documentation
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
## 2017-01-15 ENCOUNTER — Other Ambulatory Visit: Payer: Self-pay

## 2017-01-15 ENCOUNTER — Encounter (HOSPITAL_COMMUNITY): Payer: Self-pay | Admitting: *Deleted

## 2017-01-15 ENCOUNTER — Emergency Department (HOSPITAL_COMMUNITY): Payer: BLUE CROSS/BLUE SHIELD

## 2017-01-15 ENCOUNTER — Emergency Department (HOSPITAL_COMMUNITY)
Admission: EM | Admit: 2017-01-15 | Discharge: 2017-01-15 | Disposition: A | Discharge status: Home / Self Care | Payer: BLUE CROSS/BLUE SHIELD | Attending: Emergency Medicine | Admitting: Emergency Medicine

## 2017-01-15 DIAGNOSIS — Z79899 Other long term (current) drug therapy: Secondary | ICD-10-CM | POA: Diagnosis not present

## 2017-01-15 DIAGNOSIS — F17210 Nicotine dependence, cigarettes, uncomplicated: Secondary | ICD-10-CM | POA: Insufficient documentation

## 2017-01-15 DIAGNOSIS — Y999 Unspecified external cause status: Secondary | ICD-10-CM | POA: Insufficient documentation

## 2017-01-15 DIAGNOSIS — S42142A Displaced fracture of glenoid cavity of scapula, left shoulder, initial encounter for closed fracture: Secondary | ICD-10-CM | POA: Diagnosis present

## 2017-01-15 DIAGNOSIS — Y939 Activity, unspecified: Secondary | ICD-10-CM | POA: Diagnosis not present

## 2017-01-15 DIAGNOSIS — Y929 Unspecified place or not applicable: Secondary | ICD-10-CM | POA: Diagnosis not present

## 2017-01-15 DIAGNOSIS — S42152A Displaced fracture of neck of scapula, left shoulder, initial encounter for closed fracture: Secondary | ICD-10-CM

## 2017-01-15 HISTORY — DX: Scoliosis, unspecified: M41.9

## 2017-01-15 MED ORDER — CYCLOBENZAPRINE HCL 10 MG PO TABS: 10.0000 mg | ORAL_TABLET | Freq: Three times a day (TID) | ORAL | 0 refills | Status: DC | PRN

## 2017-01-15 MED ORDER — CYCLOBENZAPRINE HCL 10 MG PO TABS
10.0000 mg | ORAL_TABLET | Freq: Once | ORAL | Status: AC
  Administered 2017-01-15: 10 mg via ORAL
  Filled 2017-01-15: qty 1

## 2017-01-15 MED ORDER — IBUPROFEN 600 MG PO TABS: 600.0000 mg | ORAL_TABLET | Freq: Four times a day (QID) | ORAL | 0 refills | Status: DC | PRN

## 2017-01-15 MED ORDER — IBUPROFEN 800 MG PO TABS
800.0000 mg | ORAL_TABLET | Freq: Once | ORAL | Status: AC
  Administered 2017-01-15: 800 mg via ORAL
  Filled 2017-01-15: qty 1

## 2017-01-15 NOTE — ED Notes (Signed)
Pt taken to xray 

## 2017-01-15 NOTE — Discharge Instructions (Signed)
Call one of the orthopedic doctors listed to arrange a follow-up appt.  Keep your arm in the sling until seen by the orthopedic
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
## 2017-01-22 ENCOUNTER — Emergency Department (HOSPITAL_COMMUNITY)
Admission: EM | Admit: 2017-01-22 | Discharge: 2017-01-22 | Disposition: A | Discharge status: Home / Self Care | Payer: BLUE CROSS/BLUE SHIELD | Attending: Emergency Medicine | Admitting: Emergency Medicine

## 2017-01-22 ENCOUNTER — Encounter (HOSPITAL_COMMUNITY): Payer: Self-pay | Admitting: Emergency Medicine

## 2017-01-22 DIAGNOSIS — Y92410 Unspecified street and highway as the place of occurrence of the external cause: Secondary | ICD-10-CM | POA: Insufficient documentation

## 2017-01-22 DIAGNOSIS — S3992XA Unspecified injury of lower back, initial encounter: Secondary | ICD-10-CM | POA: Diagnosis present

## 2017-01-22 DIAGNOSIS — Y939 Activity, unspecified: Secondary | ICD-10-CM | POA: Diagnosis not present

## 2017-01-22 DIAGNOSIS — Y999 Unspecified external cause status: Secondary | ICD-10-CM | POA: Diagnosis not present

## 2017-01-22 DIAGNOSIS — Z79899 Other long term (current) drug therapy: Secondary | ICD-10-CM | POA: Insufficient documentation

## 2017-01-22 DIAGNOSIS — S39012A Strain of muscle, fascia and tendon of lower back, initial encounter: Secondary | ICD-10-CM | POA: Diagnosis not present

## 2017-01-22 DIAGNOSIS — S39012D Strain of muscle, fascia and tendon of lower back, subsequent encounter: Secondary | ICD-10-CM

## 2017-01-22 MED ORDER — CYCLOBENZAPRINE HCL 10 MG PO TABS: 10.0000 mg | ORAL_TABLET | Freq: Three times a day (TID) | ORAL | 0 refills | Status: DC | PRN

## 2017-01-22 MED ORDER — IBUPROFEN 600 MG PO TABS: 600.0000 mg | ORAL_TABLET | Freq: Four times a day (QID) | ORAL | 0 refills | Status: DC | PRN
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
## 2017-01-22 NOTE — Discharge Instructions (Signed)
Please see the information and instructions below regarding your visit.  Your diagnoses today include:  1. Strain of lumbar region, subsequent encounter    About diagnosis. Most episodes of acute low back pain are self-limited. Your exam was reassuring today that the source of your pain is not affecting the spinal cord and nerves that originate in the spinal cord.   Tests performed today include: See side panel of your discharge paperwork for testing performed today. Vital signs are listed at the bottom of these instructions.   I reviewed your x-rays that were taken on the 16th.  You have scoliosis, which was previously noted.  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  This is not new, as it was seen on a CT done in 2016.  This is a degenerative finding in the spine.  Medications prescribed:    Take any prescribed medications only as prescribed, and any over the counter medications only as directed on the packaging.  You are prescribed Flexeril, a muscle relaxant. Some common side effects of this medication include:  Feeling sleepy.  Dizziness. Take care upon going from a seated to a standing position.  Dry mouth.  Feeling tired or weak.  Hard stools (constipation).  Upset stomach. These are not all of the side effects that may occur. If you have questions about side effects, call your doctor. Call your primary care provider for medical advice about side effects.  This medication can be sedating. Only take this medication as needed. Please do not combine with alcohol. Do not drive or operate machinery while taking this medication.   This medication can interact with some other medications. Make sure to tell any provider you are taking this medication before they prescribe you a new medication.   Home care instructions:   Low back pain gets worse the longer you stay stationary. Please keep moving and walking as tolerated. There are exercises included in this packet to perform as tolerated  for your low back pain.   Apply heat to the areas that are painful. Avoid twisting or bending your trunk to lift something. Do not lift anything above 25 lbs while recovering from this flare of low back pain.  Please follow any educational materials contained in this packet.   Follow-up instructions: Please follow-up with your primary care provider as soon as possible for further evaluation of your symptoms if they are not completely improved.   Return instructions:  Please return to the Emergency Department if you experience worsening symptoms.  Please return for any fever or chills in the setting of your back pain, weakness in the muscles of the legs, numbness in your legs and feet that is new or changing, numbness in the area where you wipe, retention of your urine, loss of bowel or bladder control, or problems with walking. Please return if you have any other emergent concerns.  Additional Information:   Your vital signs today were: BP (!) 143/85 (BP Location: Left Arm)    Pulse 100    Temp 98 F (36.7 C) (Oral)    Resp 18    Ht 5\' 6"  (1.676 m)    Wt 77.1 kg (170 lb)    SpO2 100%    BMI 27.44 kg/m  If your blood pressure (BP) was elevated on multiple readings during this visit above 130 for the top number or above 80 for the bottom number, please have this repeated by your primary care provider within one month. --------------  Thank you for  allowing us to participate in your care today.

## 2018-07-21 ENCOUNTER — Emergency Department (HOSPITAL_COMMUNITY)
Admission: EM | Admit: 2018-07-21 | Discharge: 2018-07-21 | Disposition: A | Discharge status: Home / Self Care | Payer: No Typology Code available for payment source

## 2020-10-15 ENCOUNTER — Emergency Department (HOSPITAL_COMMUNITY)
Admission: EM | Admit: 2020-10-15 | Discharge: 2020-10-15 | Disposition: A | Discharge status: Home / Self Care | Payer: Self-pay | Attending: Emergency Medicine | Admitting: Emergency Medicine

## 2020-10-15 ENCOUNTER — Encounter (HOSPITAL_COMMUNITY): Payer: Self-pay | Admitting: Emergency Medicine

## 2020-10-15 ENCOUNTER — Other Ambulatory Visit: Payer: Self-pay

## 2020-10-15 DIAGNOSIS — Z5321 Procedure and treatment not carried out due to patient leaving prior to being seen by health care provider: Secondary | ICD-10-CM | POA: Insufficient documentation

## 2020-10-15 DIAGNOSIS — K0889 Other specified disorders of teeth and supporting structures: Secondary | ICD-10-CM | POA: Insufficient documentation

## 2020-10-15 NOTE — ED Triage Notes (Signed)
Pt c/o right sided dental swelling and pain that started Saturday.

## 2020-10-25 ENCOUNTER — Telehealth: Payer: Self-pay

## 2020-10-25 NOTE — Telephone Encounter (Signed)
Patient was contacted to be provided with their first medical appointment with the [facility] that has been scheduled for (Wed) Sept 28 2022 @ 10:45 am  CHIEF COMPLAINT : ROTTEN TEETH, TOP SET OF TEETH  Pt has current self reported hx of anxiety and depression (no meds or formal provider dx); substance abuse (currently not using).   Pt was enrolled and approved with [employer] [facility] 9./01/2021 to 10/15/2021  First appt information was reveiwed regarding "arrival time, bringing meds if applicable and cost ($10) one time donation fee for first visit.   Pt stated they understood and phone called

## 2020-10-31 ENCOUNTER — Ambulatory Visit: Payer: Self-pay | Admitting: Physician Assistant

## 2020-11-14 ENCOUNTER — Ambulatory Visit: Payer: Self-pay | Admitting: Physician Assistant

## 2021-06-14 ENCOUNTER — Telehealth: Payer: Self-pay

## 2021-06-14 NOTE — Telephone Encounter (Signed)
Grandmother returned call for [place]. Was able to [facility] at 1 PM. Grandmother told that if client cannot make that appointment he would need to call [facility] no later that 06/18/21 to reschedule.  ?She states understanding.  ? ? ?Francee Nodal RN ?[employer] ?

## 2021-06-14 NOTE — Telephone Encounter (Signed)
Attempted to Contact Mr Lanuza to assist in arranging an appointment to re-establish care with [facility]. Mobile number not in service, Alternate number is grandmother called, no answer, left message to return call. ? ? ?Francee Nodal RN ?[employer] ?

## 2021-06-19 ENCOUNTER — Ambulatory Visit: Payer: Self-pay | Admitting: Physician Assistant

## 2021-06-25 ENCOUNTER — Ambulatory Visit: Payer: Self-pay | Admitting: Physician Assistant

## 2021-07-18 ENCOUNTER — Emergency Department (HOSPITAL_COMMUNITY)
Admission: EM | Admit: 2021-07-18 | Discharge: 2021-07-21 | Disposition: A | Discharge status: Home / Self Care | Payer: Self-pay | Attending: Emergency Medicine | Admitting: Emergency Medicine

## 2021-07-18 ENCOUNTER — Other Ambulatory Visit: Payer: Self-pay

## 2021-07-18 ENCOUNTER — Encounter (HOSPITAL_COMMUNITY): Payer: Self-pay

## 2021-07-18 DIAGNOSIS — F1910 Other psychoactive substance abuse, uncomplicated: Secondary | ICD-10-CM | POA: Clinically undetermined

## 2021-07-18 DIAGNOSIS — F19159 Other psychoactive substance abuse with psychoactive substance-induced psychotic disorder, unspecified: Secondary | ICD-10-CM | POA: Insufficient documentation

## 2021-07-18 DIAGNOSIS — Z20822 Contact with and (suspected) exposure to covid-19: Secondary | ICD-10-CM | POA: Insufficient documentation

## 2021-07-18 DIAGNOSIS — F1990 Other psychoactive substance use, unspecified, uncomplicated: Secondary | ICD-10-CM | POA: Clinically undetermined

## 2021-07-18 DIAGNOSIS — F22 Delusional disorders: Secondary | ICD-10-CM | POA: Insufficient documentation

## 2021-07-18 DIAGNOSIS — F19959 Other psychoactive substance use, unspecified with psychoactive substance-induced psychotic disorder, unspecified: Secondary | ICD-10-CM | POA: Clinically undetermined

## 2021-07-18 DIAGNOSIS — R4182 Altered mental status, unspecified: Secondary | ICD-10-CM | POA: Insufficient documentation

## 2021-07-18 DIAGNOSIS — Y900 Blood alcohol level of less than 20 mg/100 ml: Secondary | ICD-10-CM | POA: Insufficient documentation

## 2021-07-18 DIAGNOSIS — F29 Unspecified psychosis not due to a substance or known physiological condition: Secondary | ICD-10-CM

## 2021-07-18 DIAGNOSIS — Z046 Encounter for general psychiatric examination, requested by authority: Secondary | ICD-10-CM | POA: Insufficient documentation

## 2021-07-18 LAB — COMPREHENSIVE METABOLIC PANEL
ALT: 37 U/L (ref 0–44)
AST: 48 U/L — ABNORMAL HIGH (ref 15–41)
Albumin: 4.6 g/dL (ref 3.5–5.0)
Alkaline Phosphatase: 101 U/L (ref 38–126)
Anion gap: 17 — ABNORMAL HIGH (ref 5–15)
BUN: 10 mg/dL (ref 6–20)
CO2: 20 mmol/L — ABNORMAL LOW (ref 22–32)
Calcium: 9.3 mg/dL (ref 8.9–10.3)
Chloride: 103 mmol/L (ref 98–111)
Creatinine, Ser: 1.11 mg/dL (ref 0.61–1.24)
GFR, Estimated: >60 mL/min (ref >60)
Glucose, Bld: 93 mg/dL (ref 70–99)
Potassium: 3.5 mmol/L (ref 3.5–5.1)
Sodium: 140 mmol/L (ref 135–145)
Total Bilirubin: 1 mg/dL (ref 0.3–1.2)
Total Protein: 8 g/dL (ref 6.5–8.1)

## 2021-07-18 LAB — CBC
HCT: 47 % (ref 39.0–52.0)
Hemoglobin: 15.2 g/dL (ref 13.0–17.0)
MCH: 28.7 pg (ref 26.0–34.0)
MCHC: 32.3 g/dL (ref 30.0–36.0)
MCV: 88.7 fL (ref 80.0–100.0)
Platelets: 353 10*3/uL (ref 150–400)
RBC: 5.3 MIL/uL (ref 4.22–5.81)
RDW: 12.5 % (ref 11.5–15.5)
WBC: 17.1 10*3/uL — ABNORMAL HIGH (ref 4.0–10.5)
nRBC: 0 % (ref 0.0–0.2)

## 2021-07-18 LAB — RESP PANEL BY RT-PCR (FLU A&B, COVID) ARPGX2
Influenza A by PCR: NEGATIVE
Influenza B by PCR: NEGATIVE
SARS Coronavirus 2 by RT PCR: NEGATIVE

## 2021-07-18 LAB — ETHANOL: Alcohol, Ethyl (B): <10 mg/dL (ref <10)

## 2021-07-18 MED ORDER — OLANZAPINE 5 MG PO TBDP
5.0000 mg | ORAL_TABLET | Freq: Three times a day (TID) | ORAL | Status: DC | PRN
  Administered 2021-07-18 – 2021-07-19 (×2): 5 mg via ORAL
  Filled 2021-07-18 (×2): qty 1

## 2021-07-18 MED ORDER — LORAZEPAM 1 MG PO TABS
1.0000 mg | ORAL_TABLET | ORAL | Status: AC | PRN
  Administered 2021-07-18: 1 mg via ORAL
  Filled 2021-07-18: qty 1

## 2021-07-18 MED ORDER — ACETAMINOPHEN 500 MG PO TABS
1000.0000 mg | ORAL_TABLET | Freq: Once | ORAL | Status: AC
  Administered 2021-07-18: 1000 mg via ORAL
  Filled 2021-07-18: qty 2

## 2021-07-18 MED ORDER — ZIPRASIDONE MESYLATE 20 MG IM SOLR
20.0000 mg | INTRAMUSCULAR | Status: AC | PRN
  Administered 2021-07-18: 20 mg via INTRAMUSCULAR
  Filled 2021-07-18: qty 20

## 2021-07-18 NOTE — ED Notes (Signed)
[facility] paperwork faxed; given meal and lemon-lime soda. Pt remains cooperative. Denies SI/HI, continues to have AH, reports smoking marijuana from a new dealer that triggered hallucinations.

## 2021-07-18 NOTE — ED Provider Notes (Signed)
[facility] EMERGENCY DEPARTMENT Provider Note   CSN: [id number] Arrival date & time: 07/18/21  0357     History  Chief Complaint  Patient presents with   Psychiatric Evaluation   Level 5 caveat due to psychiatric disorder Roger [name] is a 31 y.o. male.  The history is provided by the patient. The history is limited by the condition of the patient.  Mental Health Problem Presenting symptoms: bizarre behavior and paranoid behavior   Degree of incapacity (severity):  Severe Timing:  Constant Progression:  Worsening Chronicity:  New Patient presents for erratic behavior likely due to substance abuse.  It is reported patient was found running around knocking on doors.  Patient has been acting bizarrely and appears to be talking to himself     Home Medications Prior to Admission medications   Medication Sig Start Date End Date Taking? Authorizing Provider  acetaminophen (TYLENOL) 500 MG tablet Take 1,500 mg by mouth every 6 (six) hours as needed.    [provider]      Allergies    Patient has no known allergies.    Review of Systems   Review of Systems  Unable to perform ROS: Psychiatric disorder  Psychiatric/Behavioral:  Positive for paranoia.     Physical Exam Updated Vital Signs BP 130/90 (BP Location: Right Arm)   Pulse 93   Temp 98.3 F (36.8 C)   Resp 18   Ht 1.702 m (5\' 7" )   Wt 81.6 kg   SpO2 98%   BMI 28.19 kg/m  Physical Exam CONSTITUTIONAL: Disheveled, anxious HEAD: Normocephalic/atraumatic, no obvious trauma EYES: EOMI, pupils dilated bilaterally ENMT: Mucous membranes moist, poor dentition NECK: supple no meningeal signs CV: S1/S2 noted, no murmurs/rubs/gallops noted LUNGS: Lungs are clear to auscultation bilaterally, no apparent distress ABDOMEN: soft NEURO: Pt is awake/alert, moves all extremitiesx4.   EXTREMITIES: pulses normal/equal, full ROM, no deformity SKIN: warm, color normal PSYCH: Anxious, appears to  be responding to internal stimuli, agitated ED Results / Procedures / Treatments   Labs (all labs ordered are listed, but only abnormal results are displayed) Labs Reviewed  RESP PANEL BY RT-PCR (FLU A&B, COVID) ARPGX2  COMPREHENSIVE METABOLIC PANEL  ETHANOL  CBC  RAPID URINE DRUG SCREEN, HOSP PERFORMED    EKG EKG Interpretation  Date/Time:  Thursday July 18 2021 04:03:23 EDT Ventricular Rate:  94 PR Interval:  132 QRS Duration: 86 QT Interval:  368 QTC Calculation: 460 R Axis:   67 Text Interpretation: Normal sinus rhythm Normal ECG No significant change since last tracing Confirmed by [birth year] (Zadie Rhine) on 07/18/2021 5:36:01 AM  Radiology No results found.  Procedures Procedures    Medications Ordered in ED Medications  OLANZapine zydis (ZYPREXA) disintegrating tablet 5 mg (5 mg Oral Given 07/18/21 0611)    And  LORazepam (ATIVAN) tablet 1 mg (1 mg Oral Given 07/18/21 0615)    And  ziprasidone (GEODON) injection 20 mg (has no administration in time range)    ED Course/ Medical Decision Making/ A&P Clinical Course as of 07/18/21 0659  Thu Jul 18, 2021  0554 Patient presents with erratic behavior.  Patient appears to be talking to himself and responding to internal stimuli.  He was found running around knocking on doors.  Strong suspicion this is substance-induced.  Labs are pending at this time [DW]  [phone] Patient still with erratic behavior.  Labs been collected.  He has been placed under IVC.  He will need behavioral health evaluation [DW]  2956 Signed out to Dr Donnald Garre with labs pending [DW]    Clinical Course User Index [DW] Zadie Rhine, MD                           Medical Decision Making Amount and/or Complexity of Data Reviewed Labs: ordered.  Risk Prescription drug management.   This patient presents to the ED for concern of altered mental status, this involves an extensive number of treatment options, and is a complaint that carries with it  a high risk of complications and morbidity.  The differential diagnosis includes but is not limited to CVA, intracranial hemorrhage, toxidrome, substance-induced psychosis  Comorbidities that complicate the patient evaluation: Medical history is unknown  Social Determinants of Health: Patient's impaired access to primary care and suspected substance use disorder   increases the complexity of managing their presentation  Medicines ordered and prescription drug management: I ordered medication including zyprexa  for agitation  Reevaluation of the patient after these medicines showed that the patient    stayed the same   Complexity of problems addressed: Patient's presentation is most consistent with  acute presentation with potential threat to life or bodily function             Final Clinical Impression(s) / ED Diagnoses Final diagnoses:  Psychosis, unspecified psychosis type [facility])    Rx / DC Orders ED Discharge Orders     None         Zadie Rhine, MD 07/18/21 0700

## 2021-07-18 NOTE — ED Notes (Addendum)
Contacted by TTS to attempt to set pt up for TTS. Pt barely awakens and then drifts back to sleep, unable to maintain awakeness for TTS at this time. Rec'd Zyprexa at [phone], likely still sedated from that. Will allow pt to rest and reattempt later in the AM

## 2021-07-18 NOTE — ED Notes (Signed)
Pt refused labs.  

## 2021-07-18 NOTE — ED Notes (Signed)
PT refused labs PA notified

## 2021-07-18 NOTE — ED Triage Notes (Addendum)
Pt to ED via [facility]. PD was called out d/t someone running around and banging on doors. Pt mumbling incoherently, will not answer questions.  Pt denies SI/HI. Pt responding to internal stimuli, rubbing head and telling someone that is not in the room to stop laughing. Pt has redness and swelling around right great toe. Pt states he took something tonight and he doesn't know what it was.

## 2021-07-18 NOTE — ED Notes (Signed)
Candy aunt [phone] requesting an update/speak to the patient

## 2021-07-18 NOTE — ED Notes (Signed)
Pt making phone call. 

## 2021-07-18 NOTE — BH Assessment (Signed)
@  5790, requested patients nurse Scarlette Calico, RN) to set up the TTS machine.

## 2021-07-18 NOTE — ED Provider Triage Note (Addendum)
Emergency Medicine Provider Triage Evaluation Note  Roger [name] , a 31 y.o. male  was evaluated in triage.  Pt complains of toe hurting. He says that he smoked something. He is difficult to understand and appears psychotic. He is actively hallucinating  Review of Systems  Positive:  Toe pain , hallucinations Negative:   Physical Exam  BP 130/90 (BP Location: Right Arm)   Pulse 93   Temp 98.3 F (36.8 C)   Resp 18   Ht 5\' 7"  (1.702 m)   Wt 81.6 kg   SpO2 98%   BMI 28.19 kg/m  Gen:   Awake, no distress   Resp:  Normal effort  MSK:   Moves extremities without difficulty  Other:  R great toe with ingrown toenail  Medical Decision Making  Medically screening exam initiated at 4:48 AM.  Appropriate orders placed.  SHERIDAN GETTEL was informed that the remainder of the evaluation will be completed by another provider, this initial triage assessment does not replace that evaluation, and the importance of remaining in the ED until their evaluation is complete.  Work up Earnie Larsson, [place], [place] 07/18/21 [phone]

## 2021-07-18 NOTE — Consult Note (Signed)
Patient was referred to [facility], by [facility] provider, however because of patient aggressive behavior and after talking with [facility] who stated we were down in nurse tech and a nurse it was advised that the patient remain at [facility] for overnight observation.  We will reassess in the morning.

## 2021-07-18 NOTE — BH Assessment (Signed)
@  1614, attempted to assess patient. However, patient is still sleeping and nursing advised this Clinician earlier today not to wake patient up due to violent nature. TTS will attempt at a later time.

## 2021-07-18 NOTE — ED Notes (Addendum)
Pt refused labs by phlebotomy, pt IVC'd and unable to refuse labs at this time. This RN attempted to speak w/ pt and explain rationale of labwork. Pt stated that "we ain't getting no fucking labs". Explained again to pt that we needed labwork, pt again refused. Security called to assist w/ obtaining labwork and pt again continued to refuse. Pt was medicated w/ prn Geodon for agitation. Pt required multiple security guards to restrain him during medication administration. Pt was kicking, screaming, kicking during admin. Eventually able to administer medication to pt. Pt calmed down and security was able to release him. Unable to obtain bloodwork at this time. Will reattempt once pt is calmer and sedated from geodon

## 2021-07-18 NOTE — BH Assessment (Signed)
Comprehensive Clinical Assessment (CCA) Note  07/19/2021 Roger [name] [id number]  Discharge Disposition: Rockney Ghee, NP, reviewed pt's chart and information and determined pt requires continuous assessment and to be re-assessed in the morning by psychiatry. Due to aggression, pt is to remain at [facility]. This information was relayed to pt's team at 2247.  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 Acute risk factors for suicide include: family or marital conflict, unemployment, and loss (financial, interpersonal, professional). Protective factors for this patient include: positive social support. Considering these factors, the overall suicide risk at this point appears to be none. Patient is not appropriate for outpatient follow up.  Therefore, no sitter is recommended for suicide precautions.  Flowsheet Row ED from 07/18/2021 in [facility] EMERGENCY DEPARTMENT ED from 10/15/2020 in [facility] EMERGENCY DEPARTMENT  C-SSRS RISK CATEGORY No Risk No Risk     Chief Complaint:  Chief Complaint  Patient presents with   Psychiatric Evaluation   Visit Diagnosis: Substance-Induced Psychosis   CCA Screening, Triage and Referral (STR) Roger [name] is a 31 year old patient who was brought to the [facility] via [facility] due to pt's psychotic behavior. Pt was placed under IVC; the IVC paperwork states:  "The patient is psychotic. He is actively hallucinating and talking to figures in the room that are not present. His thoughts are completely disorganized and he is aggressive with staff. Patient is a danger to himself and others."  Dr. Zadie Rhine, EDP/Petitioner [facility]  Pt states he was brought to the hospital because, "I hit my head (last night) and I have a bad migraine." Pt denies SI, a hx of SI, any prior attempts to kill himself, a plan to kill  himself, or any prior hospitalizations for mental health concerns. Pt denies HI, NSSIB, access to guns/weapons, or engagement with the legal system. Pt states he smokes 1-2 blunts of marijuana "here and there."   Pt gave verbal consent to make contact with his aunt for collateral information. 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 She shares pt was recently in jail for a probation violation for 90 - 100 days and that he was just recently released; she states he went right back to using meth and/or heroin. Pt's aunt states pt o/d last week and required Narcan to save his life; she states his friend also o/d and died last week. Pt's aunt states her daughter, whom pt was very close with, died of an o/d in 03/11/2021and that she's terrified pt will die soon as well.  Pt is oriented x5. His recent/remote memory is intact. Pt was guarded throughout the assessment process. Pt's insight, judgement, and impulse control is poor at this time.  Patient Reported Information How did you hear about [place]? Legal System  What Is the Reason for Your Visit/Call Today? Pt states he was brought to the hospital because, "I hit my head (last night) and I have a bad migraine." Pt denies SI, a hx of SI, any prior attempts to kill himself, a plan to kill himself, or any prior hospitalizations for mental health concerns. Pt denies HI, NSSIB, access to guns/weapons, or engagement with the legal system. Pt states he smokes 1-2 blunts of marijuana "here and there." Pt gave verbal consent to make contact with his aunt for collateral information. Pt's aunt shared pt has been abusing methamphetamine and/or heroin for  some time. She shares pt was recently in jail for a probation violation for 90 - 100 days and that he was just recently released; she states he went right back to using meth and/or heroin. Pt's aunt states pt o/d last week and required Narcan to save his life; she states his friend also  o/d and died last week. Pt's aunt states her daughter, whom pt was very close with, died of an o/d in April 02, 2021and that she's terrified pt will die soon as well.  How Long Has This Been Causing You Problems? > than 6 months  What Do You Feel Would Help You the Most Today? -- (Pt states he would like to be d/c)   Have You Recently Had Any Thoughts About Hurting Yourself? No  Are You Planning to Commit Suicide/Harm Yourself At This time? No   Have you Recently Had Thoughts About Hurting Someone [name]? No  Are You Planning to Harm Someone at This Time? No  Explanation: No data recorded  Have You Used Any Alcohol or Drugs in the Past 24 Hours? Yes  How Long Ago Did You Use Drugs or Alcohol? No data recorded What Did You Use and How Much? Pt acknowledges marijuana use last night. UDA pending.   Do You Currently Have a Therapist/Psychiatrist? No  Name of Therapist/Psychiatrist: No data recorded  Have You Been Recently Discharged From Any Office Practice or Programs? No  Explanation of Discharge From Practice/Program: No data recorded    CCA Screening Triage Referral Assessment Type of Contact: Tele-Assessment  Telemedicine Service Delivery: Telemedicine service delivery: This service was provided via telemedicine using a 2-way, interactive audio and video technology  Is this Initial or Reassessment? Initial Assessment  Date Telepsych consult ordered in [facility]:  07/18/21  Time Telepsych consult ordered in [facility]:  0651  Location of Assessment: [facility] ED  Provider Location: [facility] Assessment Services   Collateral Involvement: Gaylan Gerold: [phone]   Does Patient Have a Court Appointed Legal Guardian? No data recorded Name and Contact of Legal Guardian: No data recorded If Minor and Not Living with Parent(s), Who has Custody? N/A  Is CPS involved or ever been involved? -- (Not assessed)  Is APS involved or ever been involved? -- (Not assessed)   Patient Determined To  Be At Risk for Harm To Self or Others Based on Review of Patient Reported Information or Presenting Complaint? No  Method: No data recorded Availability of Means: No data recorded Intent: No data recorded Notification Required: No data recorded Additional Information for Danger to Others Potential: No data recorded Additional Comments for Danger to Others Potential: No data recorded Are There Guns or Other Weapons in Your Home? No data recorded Types of Guns/Weapons: No data recorded Are These Weapons Safely Secured?                            No data recorded Who Could Verify You Are Able To Have These Secured: No data recorded Do You Have any Outstanding Charges, Pending Court Dates, Parole/Probation? No data recorded Contacted To Inform of Risk of Harm To Self or Others: -- (N/A)    Does Patient Present under Involuntary Commitment? Yes  IVC Papers Initial File Date: 07/18/21   [place] of Residence: [place]   Patient Currently Receiving the Following Services: Not Receiving Services   Determination of Need: Urgent (48 hours)   Options For Referral: Outpatient Therapy; Facility-Based Crisis; Medication Management; BH  Urgent Care     CCA Biopsychosocial Patient Reported Schizophrenia/Schizoaffective Diagnosis in Past: No   Strengths: Pt says he does "the best I can" regarding his relationships with his childen.   Mental Health Symptoms Depression:   None   Duration of Depressive symptoms:    Mania:   Increased Energy; Racing thoughts; Recklessness   Anxiety:    None   Psychosis:   Hallucinations   Duration of Psychotic symptoms:  Duration of Psychotic Symptoms: Less than six months   Trauma:   None   Obsessions:   None   Compulsions:   None   Inattention:   None   Hyperactivity/Impulsivity:   None   Oppositional/Defiant Behaviors:   Aggression towards people/animals; Angry; Argumentative; Temper   Emotional Irregularity:   Potentially  harmful impulsivity   Other Mood/Personality Symptoms:   None noted    Mental Status Exam Appearance and self-care  Stature:   Average   Weight:   Average weight   Clothing:   -- [facility](Hospital scrubs)   Grooming:   Normal   Cosmetic use:   None   Posture/gait:   Normal   Motor activity:   Not Remarkable   Sensorium  Attention:   Normal   Concentration:   Normal   Orientation:   X5   Recall/memory:   Normal   Affect and Mood  Affect:   Flat; Blunted; Other (Comment) (Gruff)   Mood:   Anxious   Relating  Eye contact:   Normal   Facial expression:   Constricted   Attitude toward examiner:   Guarded   Thought and Language  Speech flow:  Articulation error   Thought content:   Appropriate to Mood and Circumstances   Preoccupation:   None   Hallucinations:   Auditory; Visual   Organization:  No data recorded  Affiliated Computer ServicesExecutive Functions  Fund of Knowledge:   Average   Intelligence:   Average   Abstraction:   Abstract   Judgement:   Poor   Reality Testing:   Variable   Insight:   Poor   Decision Making:   Impulsive   Social Functioning  Social Maturity:   Impulsive   Social Judgement:   Naive; Heedless   Stress  Stressors:   Family conflict; Grief/losses; Housing; MetallurgistLegal; Financial; Work   Coping Ability:   Overwhelmed; Exhausted   Skill Deficits:   Communication; Decision making; Interpersonal; Self-care; Self-control; Responsibility   Supports:   Family; Friends/Service system     Religion: Religion/Spirituality Are You A Religious Person?: Yes What is Your Religious Affiliation?: Christian How Might This Affect Treatment?: Not assessed  Leisure/Recreation: Leisure / Recreation Do You Have Hobbies?:  (Not assessed)  Exercise/Diet: Exercise/Diet Do You Exercise?:  (Not assessed) Have You Gained or Lost A Significant Amount of Weight in the Past Six Months?: No Do You Follow a Special Diet?: No Do You Have  Any Trouble Sleeping?: Yes Explanation of Sleeping Difficulties: Pt shares he has trouble both falling and staying asleep.   CCA Employment/Education Employment/Work Situation: Employment / Work Situation Employment Situation: Unemployed Patient's Job has Been Impacted by Current Illness:  (N/A) Has Patient ever Been in the [employer]ancorpMilitary?: No  Education: Education Is Patient Currently Attending School?: No Last Grade Completed:  (GED) Did You Attend College?: No Did You Have An Individualized Education Program (IIEP):  (Not assessed) Did You Have Any Difficulty At School?:  (Not assessed) Patient's Education Has Been Impacted by Current Illness:  (Not assessed)   CCA  Family/Childhood History Family and Relationship History: Family history Marital status: Single Does patient have children?: Yes How many children?: 4 How is patient's relationship with their children?: Pt states, "I do the best I can."  Childhood History:  Childhood History By whom was/is the patient raised?: Grandparents Did patient suffer any verbal/emotional/physical/sexual abuse as a child?: No Did patient suffer from severe childhood neglect?: No Has patient ever been sexually abused/assaulted/raped as an adolescent or adult?: No Was the patient ever a victim of a crime or a disaster?: No Witnessed domestic violence?: No Has patient been affected by domestic violence as an adult?: No  Child/Adolescent Assessment:     CCA Substance Use Alcohol/Drug Use: Alcohol / Drug Use Pain Medications: See MAR Prescriptions: See MAR Over the Counter: See MAR History of alcohol / drug use?: Yes Longest period of sobriety (when/how long): Unknown Negative Consequences of Use: Legal, Personal relationships, Work / Programmer, multimedia, Surveyor, quantity Withdrawal Symptoms: None (Pt denies) Substance #1 Name of Substance 1: Heroin - pt denies but his aunt shares he o/d and was revived by Narcan last week. No UDA results as of yet. 1 -  Age of First Use: Unknown 1 - Amount (size/oz): Unknown 1 - Frequency: Unknown 1 - Duration: Unknown 1 - Last Use / Amount: Unknown 1 - Method of Aquiring: Unknown 1- Route of Use: Unknown Substance #2 Name of Substance 2: Methamphetamine - pt denies use but his aunt states she believes he has been using. NO UDA results as of yet. 2 - Age of First Use: Unknown 2 - Amount (size/oz): Unknown 2 - Frequency: Unknown 2 - Duration: Unknown 2 - Last Use / Amount: Unknown 2 - Method of Aquiring: Unknown 2 - Route of Substance Use: Unknown Substance #3 Name of Substance 3: Marijuana 3 - Age of First Use: Unknown 3 - Amount (size/oz): 2-3 blunts 3 - Frequency: "Here and there." 3 - Duration: Unknown 3 - Last Use / Amount: Unknown 3 - Method of Aquiring: Unknown 3 - Route of Substance Use: Smoke                   ASAM's:  Six Dimensions of Multidimensional Assessment  Dimension 1:  Acute Intoxication and/or Withdrawal Potential:   Dimension 1:  Description of individual's past and current experiences of substance use and withdrawal: Pt denies; no known clean time to determine this.  Dimension 2:  Biomedical Conditions and Complications:   Dimension 2:  Description of patient's biomedical conditions and  complications: Pt denies any biomedical conditions and complications  Dimension 3:  Emotional, Behavioral, or Cognitive Conditions and Complications:  Dimension 3:  Description of emotional, behavioral, or cognitive conditions and complications: Pt was hallucinating, paranoid, and combative, requiring IVC.  Dimension 4:  Readiness to Change:  Dimension 4:  Description of Readiness to Change criteria: Pt denies using any substance other than marijuana "here and there." Pt required Narcan for an o/d last weekl; his friend died of an o/d last week and his cousin, whom he was very close with, died  Dimension 5:  Relapse, Continued use, or Continued Problem Potential:  Dimension 5:   Relapse, continued use, or continued problem potential critiera description: Pt denies the use of any substances with the exception of marijuana; his aunt reports he was recently in jail for 90-100 days and, immediately upon his release, began using again.  Dimension 6:  Recovery/Living Environment:  Dimension 6:  Recovery/Iiving environment criteria description: Pt lives with his aunt but primarily  stays with his grandmother, whom does not set limits with him.  ASAM Severity Score: ASAM's Severity Rating Score: 15  ASAM Recommended Level of Treatment: ASAM Recommended Level of Treatment: Level III Residential Treatment   Substance use Disorder (SUD) Substance Use Disorder (SUD)  Checklist Symptoms of Substance Use: Continued use despite having a persistent/recurrent physical/psychological problem caused/exacerbated by use, Continued use despite persistent or recurrent social, interpersonal problems, caused or exacerbated by use, Presence of craving or strong urge to use, Substance(s) often taken in larger amounts or over longer times than was intended  Recommendations for Services/Supports/Treatments: Recommendations for Services/Supports/Treatments Recommendations For Services/Supports/Treatments: Facility Based Crisis, Individual Therapy, Inpatient Hospitalization, Medication Management, Peer Support Services, Residential-Level 3, SAIOP (Substance Abuse Intensive Outpatient Program), CD-IOP Intensive Chemical Dependency Program  Discharge Disposition: Rockney Ghee, NP, reviewed pt's chart and information and determined pt requires continuous assessment and to be re-assessed in the morning by psychiatry. Due to aggression, pt is to remain at [facility]. This information was relayed to pt's team at 2247.  DSM5 Diagnoses: There are no problems to display for this patient.    Referrals to Alternative Service(s): Referred to Alternative Service(s):   Place:   Date:   Time:    Referred to  Alternative Service(s):   Place:   Date:   Time:    Referred to Alternative Service(s):   Place:   Date:   Time:    Referred to Alternative Service(s):   Place:   Date:   Time:     Ralph Dowdy, LMFT

## 2021-07-18 NOTE — ED Notes (Signed)
Pt moved into room 36 for TTS. Pt is drowsy but cooperative.

## 2021-07-18 NOTE — ED Provider Notes (Signed)
Patient has been resting quietly since assuming care.  I have been awaiting lab results. Physical Exam  BP 130/90 (BP Location: Right Arm)   Pulse 93   Temp 98.3 F (36.8 C)   Resp 18   Ht 5\' 7"  (1.702 m)   Wt 81.6 kg   SpO2 98%   BMI 28.19 kg/m   Physical Exam  Procedures  Procedures  ED Course / MDM   Clinical Course as of 07/18/21 1046  Thu Jul 18, 2021  0554 Patient presents with erratic behavior.  Patient appears to be talking to himself and responding to internal stimuli.  He was found running around knocking on doors.  Strong suspicion this is substance-induced.  Labs are pending at this time [DW]  [phone] Patient still with erratic behavior.  Labs been collected.  He has been placed under IVC.  He will need behavioral health evaluation [DW]  [phone] Signed out to Dr 2229 with labs pending [DW]    Clinical Course User Index [DW] Donnald Garre, MD   Medical Decision Making Amount and/or Complexity of Data Reviewed Labs: ordered.  Risk Prescription drug management.   10: 45 patient is resting quietly.  No distress.  Labs are still pending.  Complete metabolic panel resulted but apparently the remainder of labs were not drawn.  Nursing is addressing the issue. 12: 45 patient refused labs and became combative.  Required Geodon dose for agitation and aggression towards staff.  Will need to reattempt additional lab work once patient is calm again.  Patient is not showing signs of confusion or acute medical illness.  Patient is medically cleared aside from waiting for required lab work.       Zadie Rhine, MD 07/18/21 [phone]

## 2021-07-18 NOTE — BH Assessment (Addendum)
@  1330, Attempted to complete patient's TTS assessment. However, per nursing pt has been exceptionally difficult all morning. Very violent. Refused TTS earlier,allowing him to sleep and rest. Also, stated that they retried getting blood and he became violent and irate with staff. Also, 4 security guards required to restrain pt and administer geodon-which he just got. maybe later this afternoon/evening will be better.  Clinician made an attempt to assess patient. However, nursing asked that this Clinician not awaken patient at this time. Clinician unable to complete patient's TTS assessment. Therefore, Clinician requested nursing to notify TTS when patient is ready to be seen.

## 2021-07-19 ENCOUNTER — Emergency Department (HOSPITAL_COMMUNITY): Payer: Self-pay

## 2021-07-19 DIAGNOSIS — F19959 Other psychoactive substance use, unspecified with psychoactive substance-induced psychotic disorder, unspecified: Secondary | ICD-10-CM | POA: Clinically undetermined

## 2021-07-19 DIAGNOSIS — F1990 Other psychoactive substance use, unspecified, uncomplicated: Secondary | ICD-10-CM | POA: Clinically undetermined

## 2021-07-19 DIAGNOSIS — F1910 Other psychoactive substance abuse, uncomplicated: Secondary | ICD-10-CM | POA: Clinically undetermined

## 2021-07-19 MED ORDER — OLANZAPINE 5 MG PO TBDP
10.0000 mg | ORAL_TABLET | Freq: Every day | ORAL | Status: DC
  Administered 2021-07-19 – 2021-07-21 (×3): 10 mg via ORAL
  Filled 2021-07-19 (×3): qty 2

## 2021-07-19 MED ORDER — ACETAMINOPHEN 325 MG PO TABS
650.0000 mg | ORAL_TABLET | Freq: Once | ORAL | Status: AC
Start: 2021-07-19 — End: 2021-07-19
  Administered 2021-07-19: 650 mg via ORAL
  Filled 2021-07-19: qty 2

## 2021-07-19 NOTE — ED Notes (Signed)
Patient wasn't able to give urine at this time

## 2021-07-19 NOTE — Consult Note (Signed)
Telepsych Consultation   Reason for Consult:  psych consult Referring Physician:  Zadie Rhine, MD Location of Patient:  [facility] [phone] Location of Provider: Behavioral Health TTS Department  Patient Identification: Roger [name] MRN:  [id number] Principal Diagnosis: Substance-induced psychotic disorder [facility]) Diagnosis:  Principal Problem:   Substance-induced psychotic disorder (HCC) Active Problems:   Active intravenous drug use   Polysubstance abuse (HCC)   Total Time spent with patient: 30 minutes  Subjective:   Roger [name] is a 31 y.o. male patient admitted with psychosis, unspecified.  On initial assessment pt observed sitting in room with lights out; mentions "headache". Appears paranoid, constantly looking around. Speaking incoherently; mumbling making random statements. Required repeated prompts by safety sitter to verify name and birthdate; never answered. "I'm sorry. That's crazy. The weed I smoke, makes hallucinate and forget a lot of things". Reports last use "when I came in". I don't do anything else but smoke. Hearing things". Unable to state what he's ingested, acknowledges some weed "doesn't remember what else".   Denies every being in psychiatric hospital. States yesterday was first time he experienced auditory and visual hallucinations. Twitching. Repetitive speech. "I'm trying to ignore it, I feel like if I don't see it then nobody else will see it". Continues to endorse auditory and visual hallucinations; appears bizarre and responding to possible external/internal stimuli. Possibly substance induced disorder; UDS, CT Head ordered. Pt provided verbal permission to speak to his grandmother; unable to state her name, said he wants to see her.   Collateral: Roger [name] [phone] States pt doesn't have a psychiatric history but has been "in and out of prison" for drugs and still uses drugs. He OD'd Sunday before last, EMS had to come revive him. He is shooting up  heroin and doing all types of drugs. He goes to prison and gets clean, then comes home and gets right back on it". Most recent release 05/11/21 after 6-8 months in prison/jail. Says patient has been using IV heroin, meth, and pills. Is currently on probation; officer has been looking for him.   HPI:  Roger [name] is a 31 year old male patient with no documented past psychiatric history who presented to [facility]/21 via [facility] involuntarily with unspecified psychosis after being reported "running around banging on doors". Per chart review pt was observed mumbling incoherently and not answering questions. Currently under IVC. UDS outstanding; WBC 17.1. BAL<10. PDMP reviewed, no prescriptions noted.   Past Psychiatric History: polysubstance abuse  Risk to Self:   Risk to Others:   Prior Inpatient Therapy:   Prior Outpatient Therapy:    Past Medical History:  Past Medical History:  Diagnosis Date   MRSA (methicillin resistant Staphylococcus aureus)    Scoliosis    History reviewed. No pertinent surgical history. Family History:  Family History  Problem Relation Age of Onset   Diabetes Mother    Family Psychiatric  History: not noted Social History:  Social History   Substance and Sexual Activity  Alcohol Use No     Social History   Substance and Sexual Activity  Drug Use No    Social History   Socioeconomic History   Marital status: Single    Spouse name: Not on file   Number of children: Not on file   Years of education: Not on file   Highest education level: Not on file  Occupational History   Not on file  Tobacco Use   Smoking status: Every Day  Packs/day: 1.00    Years: 13.00    Total pack years: 13.00    Types: Cigarettes   Smokeless tobacco: Never  Vaping Use   Vaping Use: Never used  Substance and Sexual Activity   Alcohol use: No   Drug use: No   Sexual activity: Yes    Birth control/protection: None  Other Topics Concern   Not on file  Social  History Narrative   Not on file   Social Determinants of Health   Financial Resource Strain: Not on file  Food Insecurity: Not on file  Transportation Needs: Not on file  Physical Activity: Not on file  Stress: Not on file  Social Connections: Not on file   Additional Social History:    Allergies:  No Known Allergies  Labs:  Results for orders placed or performed during the hospital encounter of 07/18/21 (from the past 48 hour(s))  Comprehensive metabolic panel     Status: Abnormal   Collection Time: 07/18/21  7:48 AM  Result Value Ref Range   Sodium 140 135 - 145 mmol/L   Potassium 3.5 3.5 - 5.1 mmol/L   Chloride 103 98 - 111 mmol/L   CO2 20 (L) 22 - 32 mmol/L   Glucose, Bld 93 70 - 99 mg/dL    Comment: Glucose reference range applies only to samples taken after fasting for at least 8 hours.   BUN 10 6 - 20 mg/dL   Creatinine, Ser 1.61 0.61 - 1.24 mg/dL   Calcium 9.3 8.9 - 09.6 mg/dL   Total Protein 8.0 6.5 - 8.1 g/dL   Albumin 4.6 3.5 - 5.0 g/dL   AST 48 (H) 15 - 41 U/L   ALT 37 0 - 44 U/L   Alkaline Phosphatase 101 38 - 126 U/L   Total Bilirubin 1.0 0.3 - 1.2 mg/dL   GFR, Estimated >04 >54 mL/min    Comment: (NOTE) Calculated using the CKD-EPI Creatinine Equation (2021)    Anion gap 17 (H) 5 - 15    Comment: Performed at [facility], [address] [place], [place] [address]  Ethanol     Status: None   Collection Time: 07/18/21  6:00 PM  Result Value Ref Range   Alcohol, Ethyl (B) <10 <10 mg/dL    Comment: (NOTE) Lowest detectable limit for serum alcohol is 10 mg/dL.  For medical purposes only. Performed at [facility], [address] [place], [place] [address]   CBC     Status: Abnormal   Collection Time: 07/18/21  6:00 PM  Result Value Ref Range   WBC 17.1 (H) 4.0 - 10.5 K/uL   RBC 5.30 4.22 - 5.81 MIL/uL   Hemoglobin 15.2 13.0 - 17.0 g/dL   HCT 29.5 62.1 - 30.8 %   MCV 88.7 80.0 - 100.0 fL   MCH 28.7 26.0 - 34.0 pg   MCHC 32.3 30.0  - 36.0 g/dL   RDW 65.7 84.6 - 96.2 %   Platelets 353 150 - 400 K/uL   nRBC 0.0 0.0 - 0.2 %    Comment: Performed at [facility], [address], [place], [place] [address]  Resp Panel by RT-PCR (Flu A&B, Covid) Anterior Nasal Swab     Status: None   Collection Time: 07/18/21  9:50 PM   Specimen: Anterior Nasal Swab  Result Value Ref Range   SARS Coronavirus 2 by RT PCR NEGATIVE NEGATIVE    Comment: (NOTE) SARS-CoV-2 target nucleic acids are NOT  DETECTED.  The SARS-CoV-2 RNA is generally detectable in upper respiratory specimens during the acute phase of infection. The lowest concentration of SARS-CoV-2 viral copies this assay can detect is 138 copies/mL. A negative result does not preclude SARS-Cov-2 infection and should not be used as the sole basis for treatment or other patient management decisions. A negative result may occur with  improper specimen collection/handling, submission of specimen other than nasopharyngeal swab, presence of viral mutation(s) within the areas targeted by this assay, and inadequate number of viral copies(<138 copies/mL). A negative result must be combined with clinical observations, patient history, and epidemiological information. The expected result is Negative.  Fact Sheet for Patients:  BloggerCourse.com  Fact Sheet for Healthcare Providers:  SeriousBroker.it  This test is no t yet approved or cleared by the [place] FDA and  has been authorized for detection and/or diagnosis of SARS-CoV-2 by FDA under an Emergency Use Authorization (EUA). This EUA will remain  in effect (meaning this test can be used) for the duration of the COVID-19 declaration under Section 564(b)(1) of the Act, 21 U.S.C.section 360bbb-3(b)(1), unless the authorization is terminated  or revoked sooner.       Influenza A by PCR NEGATIVE NEGATIVE   Influenza B by PCR NEGATIVE NEGATIVE    Comment: (NOTE) The  Xpert Xpress SARS-CoV-2/FLU/RSV plus assay is intended as an aid in the diagnosis of influenza from Nasopharyngeal swab specimens and should not be used as a sole basis for treatment. Nasal washings and aspirates are unacceptable for Xpert Xpress SARS-CoV-2/FLU/RSV testing.  Fact Sheet for Patients: BloggerCourse.com  Fact Sheet for Healthcare Providers: SeriousBroker.it  This test is not yet approved or cleared by the [place] FDA and has been authorized for detection and/or diagnosis of SARS-CoV-2 by FDA under an Emergency Use Authorization (EUA). This EUA will remain in effect (meaning this test can be used) for the duration of the COVID-19 declaration under Section 564(b)(1) of the Act, 21 U.S.C. section 360bbb-3(b)(1), unless the authorization is terminated or revoked.  Performed at [facility], [address], [place], [place] [address]     Medications:  Current Facility-Administered Medications  Medication Dose Route Frequency Provider Last Rate Last Admin   OLANZapine zydis (ZYPREXA) disintegrating tablet 5 mg  5 mg Oral Q8H PRN Zadie Rhine, MD   5 mg at 07/19/21 0015   No current outpatient medications on file.    Musculoskeletal: Strength & Muscle Tone: increased Gait & Station:  unable to assess; pt remains in bed Patient leans: Backward  Psychiatric Specialty Exam:  Presentation  General Appearance: Bizarre; Disheveled Eye Contact:Fleeting Speech:Clear and Coherent Speech Volume:Normal Handedness:No data recorded  Mood and Affect  Mood:Anxious Affect:Inappropriate  Thought Process  Thought Processes:Disorganized Descriptions of Associations:Loose  Orientation:None  Thought Content:Illogical; Paranoid Ideation; Scattered  History of Schizophrenia/Schizoaffective disorder:No  Duration of Psychotic Symptoms:N/A  Hallucinations:Hallucinations: Auditory; Visual Description of  Auditory Hallucinations: unclear Description of Visual Hallucinations: unclear  Ideas of Reference:Paranoia; Delusions  Suicidal Thoughts:Suicidal Thoughts: No  Homicidal Thoughts:Homicidal Thoughts: No   Sensorium  Memory:Immediate Poor; Recent Poor; Remote Poor Judgment:Poor Insight:Poor; None  Executive Functions  Concentration:Poor Attention Span:Poor Recall:Poor Fund of Knowledge:Poor Language:Poor  Psychomotor Activity  Psychomotor Activity:Psychomotor Activity: Increased; Restlessness  Assets  Assets:Physical Health; Resilience  Sleep  Sleep:Sleep: Fair   Physical Exam: Physical Exam Vitals and nursing note reviewed.  Constitutional:      Appearance: He is toxic-appearing.  HENT:     Head: Normocephalic.     Nose:  Nose normal.     Mouth/Throat:     Pharynx: Oropharynx is clear.  Eyes:     Pupils: Pupils are equal, round, and reactive to light.  Cardiovascular:     Rate and Rhythm: Normal rate.     Pulses: Normal pulses.  Pulmonary:     Effort: Pulmonary effort is normal.  Abdominal:     General: Abdomen is flat.  Musculoskeletal:        General: Normal range of motion.     Cervical back: Normal range of motion.  Skin:    General: Skin is dry.  Neurological:     Mental Status: He is alert. He is disoriented.  Psychiatric:        Attention and Perception: He is inattentive. He perceives auditory and visual hallucinations.        Mood and Affect: Mood is anxious. Affect is inappropriate.        Speech: He is noncommunicative. Speech is delayed.        Behavior: Behavior is uncooperative and agitated.        Thought Content: Thought content is paranoid and delusional. Thought content does not include homicidal or suicidal plan.        Cognition and Memory: Cognition is impaired. Memory is impaired.        Judgment: Judgment is impulsive and inappropriate.    Review of Systems  Neurological:  Positive for sensory change.  Psychiatric/Behavioral:   Positive for hallucinations and substance abuse.   All other systems reviewed and are negative.  Blood pressure 121/80, pulse 80, temperature 98.4 F (36.9 C), temperature source Oral, resp. rate 18, height 5\' 7"  (1.702 m), weight 81.6 kg, SpO2 100 %. Body mass index is 28.19 kg/m.  Treatment Plan Summary: Daily contact with patient to assess and evaluate symptoms and progress in treatment, Medication management, and Plan continue to observe and monitor while in ED, seek inpatient hospitalization for further observation, stabilization, and treatment.   Disposition: Recommend psychiatric Inpatient admission when medically cleared. Supportive therapy provided about ongoing stressors. Discussed crisis plan, support from social network, calling 911, coming to the Emergency Department, and calling Suicide Hotline.  This service was provided via telemedicine using a 2-way, interactive audio and video technology.  Names of all persons participating in this telemedicine service and their role in this encounter. Name: Role: PMHNP  Name: Maxie Barb Role: Attending MD  Name: Nelly Rout Role: patient  Name:  Role:     Cristino Martes, NP 07/19/2021 10:13 AM

## 2021-07-19 NOTE — ED Notes (Signed)
Pt in room tolerating breakfast. Showered in purple zone. Pt denies SI/HI. AH still present stating punch something. Pt not interacting with the voice at this time. Pt calm and cooperative with staff. Pt is c/o of a HA at this time.

## 2021-07-19 NOTE — ED Notes (Signed)
Pt in room resting. NAD chest rising and falling. Sitter at bedside. 

## 2021-07-19 NOTE — ED Notes (Signed)
Pt in room resting. NAD chest rising and falling. Sitter at bedside.

## 2021-07-19 NOTE — Progress Notes (Signed)
There are no available beds at [facility] per Rosezetta Schlatter, RN.  Maryjean Ka, MSW, [facility] 07/19/2021 10:39 PM

## 2021-07-19 NOTE — ED Notes (Signed)
The pts aunt has called number given to the sitter so he can call  when he wakes up

## 2021-07-19 NOTE — Progress Notes (Signed)
CSW requested that [facility] Rosezetta Schlatter, RN review for [facility] admission. CSW will assist and follow.   Maryjean Ka, MSW, [facility] 07/19/2021 8:44 PM

## 2021-07-19 NOTE — Progress Notes (Addendum)
Inpatient Behavioral Health Placement  Meets inpatient criteria per Maxie Barb, NP.  Referral was sent to the following facilities;   Destination Service Provider Address Phone Fax  [facility]  [address] [place] [address] [phone]  [facility]  [address] [place] [address] [phone]  [facility]  [address] [place] [address] [phone]  [facility]  [address] [place] [address] [phone]  [facility] [address]  [address] [place] [address] [phone]  [facility]  [address] [place] [address] [phone]  [facility]  [address] [place] [address] [phone]  [facility]  [address] [place] [address] [phone]  [facility]  [address], [place] [address] [phone]  [facility]  [address] [place] [address] [phone]  [facility]  [address] [place] [address] [phone]  [facility]  [address] [place] [address] [phone]  [facility]  [address] [place] [address] [phone]  [facility]  [address] [place] [address] [phone]    Situation ongoing,  CSW will follow up.   Maryjean Ka, MSW, LCSWA 07/19/2021  @ 8:31 PM

## 2021-07-19 NOTE — ED Notes (Signed)
Pt has urine cup at bedside; unable to provide sample at this time. Pt moved into a room, given zyprexa to help sleep. Also has been updated on plan for reassessment in the morning

## 2021-07-20 LAB — CBC WITH DIFFERENTIAL/PLATELET
Abs Immature Granulocytes: 0.1 10*3/uL — ABNORMAL HIGH (ref 0.00–0.07)
Basophils Absolute: 0.1 10*3/uL (ref 0.0–0.1)
Basophils Relative: 1 %
Eosinophils Absolute: 0.4 10*3/uL (ref 0.0–0.5)
Eosinophils Relative: 3 %
HCT: 41.9 % (ref 39.0–52.0)
Hemoglobin: 14 g/dL (ref 13.0–17.0)
Immature Granulocytes: 1 %
Lymphocytes Relative: 39 %
Lymphs Abs: 5.5 10*3/uL — ABNORMAL HIGH (ref 0.7–4.0)
MCH: 29.4 pg (ref 26.0–34.0)
MCHC: 33.4 g/dL (ref 30.0–36.0)
MCV: 87.8 fL (ref 80.0–100.0)
Monocytes Absolute: 1 10*3/uL (ref 0.1–1.0)
Monocytes Relative: 7 %
Neutro Abs: 7.2 10*3/uL (ref 1.7–7.7)
Neutrophils Relative %: 49 %
Platelets: 326 10*3/uL (ref 150–400)
RBC: 4.77 MIL/uL (ref 4.22–5.81)
RDW: 12.5 % (ref 11.5–15.5)
WBC: 14.2 10*3/uL — ABNORMAL HIGH (ref 4.0–10.5)
nRBC: 0 % (ref 0.0–0.2)

## 2021-07-20 LAB — RAPID URINE DRUG SCREEN, HOSP PERFORMED
Amphetamines: POSITIVE — AB
Barbiturates: NOT DETECTED
Benzodiazepines: NOT DETECTED
Cocaine: NOT DETECTED
Opiates: NOT DETECTED
Tetrahydrocannabinol: POSITIVE — AB

## 2021-07-20 LAB — HIV ANTIBODY (ROUTINE TESTING W REFLEX): HIV Screen 4th Generation wRfx: NONREACTIVE

## 2021-07-20 MED ORDER — KETOROLAC TROMETHAMINE 30 MG/ML IJ SOLN
30.0000 mg | Freq: Once | INTRAMUSCULAR | Status: DC
  Filled 2021-07-20: qty 1

## 2021-07-20 MED ORDER — ACETAMINOPHEN 325 MG PO TABS
650.0000 mg | ORAL_TABLET | Freq: Once | ORAL | Status: AC | PRN
  Administered 2021-07-20: 650 mg via ORAL
  Filled 2021-07-20: qty 2

## 2021-07-20 MED ORDER — ACETAMINOPHEN 325 MG PO TABS
650.0000 mg | ORAL_TABLET | Freq: Once | ORAL | Status: AC
  Administered 2021-07-20: 650 mg via ORAL
  Filled 2021-07-20: qty 2

## 2021-07-20 NOTE — ED Notes (Signed)
Received verbal report from Chris C RN at this time ?

## 2021-07-20 NOTE — ED Notes (Signed)
Sitter arrived

## 2021-07-20 NOTE — ED Notes (Signed)
The pt refused to take the injection of toradol he does not like needles  and he reports that he headache is no longer bothering him

## 2021-07-20 NOTE — Progress Notes (Signed)
Per Vivien Presto, patient meets criteria for inpatient treatment. There are no available beds at [facility] today. CSW faxed referrals to the following facilities for review:  [facility]  Pending - No Request Sent N/A [address], [place] [address] [phone] --  [facility]  Pending - No Request Sent N/A [address], [place] [address] [phone] --  [facility]  Pending - No Request Sent N/A [address], [place] [address] [phone] --  [facility]  Pending - No Request Sent N/A [address] [place] [address] [phone] --  [facility]  Pending - No Request Sent N/A [address], [place] [address] [phone] --  [facility]  Pending - No Request Sent N/A [address] [place] [address] [phone] --  [facility]  Pending - No Request Sent N/A [address] [place] [address] [phone] --  [facility]  Pending - No Request Sent N/A [address] [place] [address] [phone] --  [facility]  Pending - No Request Sent N/A [address] [place] [address] [phone] --  [facility]  Pending - No Request Sent N/A [address] [place] [address] [phone] --  [facility]  Pending - No Request [facility] [address] [place] [address] [phone] --  [facility]  Pending - No Request Sent N/A [address] [place] [address] [phone] --  [facility]  Pending - No Request Sent N/A [name] [place] [address] [phone] --  [facility]  Pending - No Request Sent N/A [address] [place] [address] [phone] --  [facility]  Pending - No Request Sent N/A [address], [place] [address] [phone] --  [facility]  Pending - No Request Sent N/A [address] [place] [address] [phone] --  [facility]  Pending - No Request Sent N/A [address] [place] [address] [phone] --  [facility]  Pending - No Request Sent N/A [address], [place] [address] [phone] --   TTS will continue to seek bed placement.  Crissie Reese, MSW, [facility] Phone: [phone] Disposition/TOC

## 2021-07-20 NOTE — ED Notes (Signed)
Pt resting comfortably, no needs expressed at this time. No acute distress noted.

## 2021-07-20 NOTE — ED Notes (Signed)
The pt is c/o a headache 

## 2021-07-20 NOTE — ED Notes (Signed)
Patient resting at this time. Equal chest rise and fall noted. Sitter present at bedside.

## 2021-07-21 MED ORDER — OLANZAPINE 10 MG PO TABS: 10.0000 mg | ORAL_TABLET | Freq: Every day | ORAL | 0 refills | Status: DC

## 2021-07-21 NOTE — ED Notes (Signed)
Pt showered, changed back into personal clothing, and belongings returned.

## 2021-07-21 NOTE — Progress Notes (Signed)
CSW provided the following resources to AVS:   [facility] provide timely access to mental health services for children and adolescents (4-17) and adults presenting in a mental health crisis. The program is designed for those who need urgent Behavioral Health or Substance Use treatment and are not experiencing a medical crisis that would typically require an emergency room visit.    [address] [place], [place] [address] Phone: [phone] [email or web address]   [facility] will also offer the following outpatient services: (Monday through Friday 8am-5pm)   Partial Hospitalization Program (PHP) Substance Abuse Intensive Outpatient Program (SA-IOP) Group Therapy Medication Management Peer Living Room   We also provide (24/7):    Assessments: Our mental health clinician and providers will conduct a focused mental health evaluation, assessing for immediate safety concerns and further mental health needs.   Referral: Our team will provide resources and help connect to community based mental health treatment, when indicated, including psychotherapy, psychiatry, and other specialized behavioral health or substance use disorder services (for those not already in treatment).   Transitional Care: Our team providers in person bridging and/or telphonic follow-up during the patient's transition to outpatient services.      Crissie Reese, MSW, [facility] Phone: [phone] Disposition/TOC

## 2021-07-21 NOTE — ED Notes (Signed)
Belongings returned to pt

## 2021-07-21 NOTE — ED Notes (Signed)
Pt taken to 46 for TTS

## 2021-07-21 NOTE — ED Notes (Signed)
Notice of Commitment Change  (rescinded IVC) faxed to [facility] at 13:50 today. Original placed in [employer] and copy made for medical records. Informed RN.

## 2021-07-21 NOTE — Consult Note (Signed)
Telepsych Consultation   Reason for Consult:  Psychiatric Reassessment Referring Physician:  Dr. Zadie Rhine Location of Patient:    [facility] Location of Provider: Other: virtual home office  Patient Identification: Roger [name] MRN:  [id number] Principal Diagnosis: Substance-induced psychotic disorder [facility]) Diagnosis:  Principal Problem:   Substance-induced psychotic disorder (HCC) Active Problems:   Active intravenous drug use   Polysubstance abuse (HCC)   Total Time spent with patient: 30 minutes  Subjective:   Roger [name] is a 31 y.o. male patient admitted with acute psychosis. His uds was + for thc.   HPI:  Patient seen via telepsych by this provider; chart reviewed and consulted with Dr. Lucianne Muss on 07/21/21.  On evaluation Roger [name] reports, "I"m blessed." When asked how he's doing today.  States he's doing better today.  Pt is clear and coherent and able to appropriately verbalize his needs/concerns.  He's no longer acting bizarre, behavior has returned to baseline.  Pt denies suicidal or homicidal ideations.  States audible/visual hallucinations have resolved.  Patient states he believes the marijuana he smoked prior to arrival is the cause of his complaints.  Endorses use or marijuana for recreation and gets it from the street.    Per nursing notes, he has been cooperative and no psychotic behaviors documented within the past 48 hours.  Patient is eating and sleeping good.  Has tolerated olanzapine 10mg  po daily without concerns.     Per EDP Admission Assessment 07/18/2021: Chief Complaint  Patient presents with   Psychiatric Evaluation    Level 5 caveat due to psychiatric disorder Roger [name] is a 31 y.o. male.   The history is provided by the patient. The history is limited by the condition of the patient.  Mental Health Problem Presenting symptoms: bizarre behavior and paranoid behavior   Degree of incapacity (severity):  Severe Timing:   Constant Progression:  Worsening Chronicity:  New Patient presents for erratic behavior likely due to substance abuse.  It is reported patient was found running around knocking on doors.  Patient has been acting bizarrely and appears to be talking to himself  Past Psychiatric History: As outlined above  Risk to Self:  no Risk to Others:  no Prior Inpatient Therapy:  no Prior Outpatient Therapy:  no  Past Medical History:  Past Medical History:  Diagnosis Date   MRSA (methicillin resistant Staphylococcus aureus)    Scoliosis    History reviewed. No pertinent surgical history. Family History:  Family History  Problem Relation Age of Onset   Diabetes Mother    Family Psychiatric  History: unknown Social History:  Social History   Substance and Sexual Activity  Alcohol Use No     Social History   Substance and Sexual Activity  Drug Use No    Social History   Socioeconomic History   Marital status: Single    Spouse name: Not on file   Number of children: Not on file   Years of education: Not on file   Highest education level: Not on file  Occupational History   Not on file  Tobacco Use   Smoking status: Every Day    Packs/day: 1.00    Years: 13.00    Total pack years: 13.00    Types: Cigarettes   Smokeless tobacco: Never  Vaping Use   Vaping Use: Never used  Substance and Sexual Activity   Alcohol use: No   Drug use: No   Sexual activity: Yes  Birth control/protection: None  Other Topics Concern   Not on file  Social History Narrative   Not on file   Social Determinants of Health   Financial Resource Strain: Not on file  Food Insecurity: Not on file  Transportation Needs: Not on file  Physical Activity: Not on file  Stress: Not on file  Social Connections: Not on file   Additional Social History:    Allergies:  No Known Allergies  Labs:  Results for orders placed or performed during the hospital encounter of 07/18/21 (from the past 48  hour(s))  CBC with Differential/Platelet     Status: Abnormal   Collection Time: 07/20/21  2:18 AM  Result Value Ref Range   WBC 14.2 (H) 4.0 - 10.5 K/uL   RBC 4.77 4.22 - 5.81 MIL/uL   Hemoglobin 14.0 13.0 - 17.0 g/dL   HCT 47.8 29.5 - 62.1 %   MCV 87.8 80.0 - 100.0 fL   MCH 29.4 26.0 - 34.0 pg   MCHC 33.4 30.0 - 36.0 g/dL   RDW 30.8 65.7 - 84.6 %   Platelets 326 150 - 400 K/uL   nRBC 0.0 0.0 - 0.2 %   Neutrophils Relative % 49 %   Neutro Abs 7.2 1.7 - 7.7 K/uL   Lymphocytes Relative 39 %   Lymphs Abs 5.5 (H) 0.7 - 4.0 K/uL   Monocytes Relative 7 %   Monocytes Absolute 1.0 0.1 - 1.0 K/uL   Eosinophils Relative 3 %   Eosinophils Absolute 0.4 0.0 - 0.5 K/uL   Basophils Relative 1 %   Basophils Absolute 0.1 0.0 - 0.1 K/uL   WBC Morphology MORPHOLOGY UNREMARKABLE    RBC Morphology MORPHOLOGY UNREMARKABLE    Smear Review MORPHOLOGY UNREMARKABLE    Immature Granulocytes 1 %   Abs Immature Granulocytes 0.10 (H) 0.00 - 0.07 K/uL    Comment: Performed at [facility], [address], [place], [place] [address]  HIV Antibody (routine testing w rflx)     Status: None   Collection Time: 07/20/21  2:18 AM  Result Value Ref Range   HIV Screen 4th Generation wRfx Non Reactive Non Reactive    Comment: Performed at [facility], [address], [place], [place] [address]    Medications:  Current Facility-Administered Medications  Medication Dose Route Frequency Provider Last Rate Last Admin   ketorolac (TORADOL) 30 MG/ML injection 30 mg  30 mg Intramuscular Once Long, Arlyss Repress, MD       OLANZapine zydis (ZYPREXA) disintegrating tablet 10 mg  10 mg Oral Daily Leevy-Johnson, Brooke A, NP   10 mg at 07/21/21 1017   OLANZapine zydis (ZYPREXA) disintegrating tablet 5 mg  5 mg Oral Q8H PRN Zadie Rhine, MD   5 mg at 07/19/21 0015   No current outpatient medications on file.    Musculoskeletal: pt moves all extremities and ambulates independently.  Strength & Muscle Tone:  within normal limits Gait & Station: normal Patient leans: N/A   Psychiatric Specialty Exam:  Presentation  General Appearance: Appropriate for Environment; Casual  Eye Contact:Good  Speech:Clear and Coherent; Normal Rate  Speech Volume:Normal  Handedness:Right   Mood and Affect  Mood:Euthymic  Affect:Congruent; Appropriate   Thought Process  Thought Processes:Coherent; Goal Directed  Descriptions of Associations:Intact  Orientation:Full (Time, Place and Person)  Thought Content:Logical  History of Schizophrenia/Schizoaffective disorder:No  Duration of Psychotic Symptoms:N/A  Hallucinations:Hallucinations: None (audible hallucinations has cleared up since admission)  Ideas of Reference:None  Suicidal Thoughts:Suicidal  Thoughts: No  Homicidal Thoughts:Homicidal Thoughts: No   Sensorium  Memory:Immediate Good; Recent Good; Remote Good  Judgment:Good (good today as he's no longer acutely intoxicated;)  Insight:Fair   Executive Functions  Concentration:Good  Attention Span:Good  Recall:Good  Fund of Knowledge:Good  Language:Good   Psychomotor Activity  Psychomotor Activity:Psychomotor Activity: Normal   Assets  Assets:Communication Skills; Desire for Improvement; Housing   Sleep  Sleep:Sleep: Good Number of Hours of Sleep: 8    Physical Exam: Physical Exam Constitutional:      Appearance: Normal appearance.  Cardiovascular:     Rate and Rhythm: Normal rate.     Pulses: Normal pulses.  Pulmonary:     Effort: Pulmonary effort is normal.  Musculoskeletal:        General: Normal range of motion.     Cervical back: Normal range of motion.  Neurological:     General: No focal deficit present.     Mental Status: He is alert and oriented to person, place, and time.  Psychiatric:        Mood and Affect: Mood normal.        Behavior: Behavior normal.        Thought Content: Thought content normal.    ROS Blood pressure 128/73,  pulse 72, temperature 98.4 F (36.9 C), temperature source Oral, resp. rate 16, height 5\' 7"  (1.702 m), weight 81.6 kg, SpO2 99 %. Body mass index is 28.19 kg/m.  Treatment Plan Summary: Patient demonstrates symptomatic mood improvement since starting olanzapine 10mg  po daily; and coming down from illicit substance intoxication.  He is alert and oriented, clear and coherent and no longer hallucinating.  He does not appear psychotic.  He would like to follow-up with outpatient psychiatry for therapy and med mgmt.  Declines resources to help stop using marijuana.  Plan- As per above assessment, there are no current grounds for involuntary commitment at this time.  Patient is not currently interested in inpatient services but expresses agreement to continue outpatient treatment., we have reviewed importance of substance abuse abstinence, potential negative impact substance abuse can have on his relationships and level of functioning, and importance of medication compliance.  Disposition: No evidence of imminent risk to self or others at present.   Patient does not meet criteria for psychiatric inpatient admission. Supportive therapy provided about ongoing stressors. Discussed crisis plan, support from social network, calling 911, coming to the Emergency Department, and calling Suicide Hotline.  This service was provided via telemedicine using a 2-way, interactive audio and video technology.  Names of all persons participating in this telemedicine service and their role in this encounter. Name: Roger [name] Role: Patient  Name: Role: PMHNP    Cristino Martes, NP 07/21/2021 11:05 AM

## 2021-07-21 NOTE — ED Provider Notes (Signed)
  Physical Exam  BP 128/73   Pulse 72   Temp 98.4 F (36.9 C) (Oral)   Resp 16   Ht 5\' 7"  (1.702 m)   Wt 81.6 kg   SpO2 99%   BMI 28.19 kg/m   Physical Exam  Procedures  Procedures  ED Course / MDM   Clinical Course as of 07/21/21 1343  Thu Jul 18, 2021  Jul 20, 2021 Patient presents with erratic behavior.  Patient appears to be talking to himself and responding to internal stimuli.  He was found running around knocking on doors.  Strong suspicion this is substance-induced.  Labs are pending at this time [DW]  [phone] Patient still with erratic behavior.  Labs been collected.  He has been placed under IVC.  He will need behavioral health evaluation [DW]  [phone] Signed out to Dr [id number] with labs pending [DW]    Clinical Course User Index [DW] Donnald Garre, MD   Medical Decision Making Amount and/or Complexity of Data Reviewed Labs: ordered.  Risk OTC drugs. Prescription drug management.   Patient has been seen by psychiatry and cleared for discharge.  Recommended olanzapine at 10 mg.  Social work has been consulted to help patient get the medicine.  IVC has been reversed.  Prescription ordered.       Zadie Rhine, MD 07/21/21 1343

## 2021-07-21 NOTE — Discharge Instructions (Addendum)
[facility]-will provide timely access to mental health services for children and adolescents (4-17) and adults presenting in a mental health crisis. The program is designed for those who need urgent Behavioral Health or Substance Use treatment and are not experiencing a medical crisis that would typically require an emergency room visit.    [address] [place], [place] [address] Phone: [phone] [email or web address]   [facility] will also offer the following outpatient services: (Monday through Friday 8am-5pm)   Partial Hospitalization Program (PHP) Substance Abuse Intensive Outpatient Program (SA-IOP) Group Therapy Medication Management Peer Living Room   We also provide (24/7):    Assessments: Our mental health clinician and providers will conduct a focused mental health evaluation, assessing for immediate safety concerns and further mental health needs.   Referral: Our team will provide resources and help connect to community based mental health treatment, when indicated, including psychotherapy, psychiatry, and other specialized behavioral health or substance use disorder services (for those not already in treatment).   Transitional Care: Our team providers in person bridging and/or telphonic follow-up during the patient's transition to outpatient services.        MATCH MEDICATION ASSISTANCE CARD Name: Jaiyden Laur ID: [id number] Bin: [id number] RX Group: [id number] Discharge Date:  07/21/2021 Expiration Date:  07/28/2021 (must be filled within 7 days of discharge)                  Dear _______Joshua  Bonita [name] have been approved to have the prescriptions written by your discharging physician filled through our [facility] (Medication Assistance Through [facility]) program. This program allows for a one-time (no refills) 34-day supply of selected medications for a low copay amount.  The copay is $3.00 per prescription. For  instance, if you have one prescription, you will pay $3.00; for two prescriptions, you pay $6.00; for three prescriptions, you pay $9.00; and so on.  Only certain pharmacies are participating in this program with [facility]. You will need to select one of the pharmacies from the attached list and take your prescriptions, this letter, and your photo ID to one of the participating pharmacies.   We are excited that you are able to use the [facility] program to get your medications. These prescriptions must be filled within 7 days of hospital discharge or they will no longer be valid for the [facility] program. Should you have any problems with your prescriptions please contact your case management team member at [phone] for [facility].  Thank you, Gilda Crease DNP, MSN, RN [phone]   [facility]      [facility] - Locations  [facility]        [address] [place], [place]  [facility]    [address] [place], [place], [place]   [facility]        [address], [place], [place]  [facility]       [address] [place], [place], [place]  Other [facility] [address] [place], [place]  [facility]  [address], [place], [place]  [facility]          [address], [place], [place]  [facility] [address], [place], [place]   [address] [place], [place], [place]  [address], [place], [place]  [address] [place], [place]   [address] [place], [place]   [address], [place], [place]   [address] [place], [place] [address] [place], [place] [address], [place],  [place]    [facility] [address], [place], [place]  [address] [place], [place] [address], [place], [place]  [address] [place], [place] [address] [place], [place] [address], [place], [place] [address] [place], [place]  [address] [place], [place] [address]00 [place], [place], [place] [address], [place], [place] [address], [place], [place]   [address], [place], [place] [address], [place], [place] [address], [place], [place] [address], [place], [place]   [address], [place], [place]   [address] [place], [place] [address] [place], [place], [place]  [address], [place], [place]  [facility] [address] [place], [place], [place] [address] [place], [place], [place] [address], [place], [place] [address] [place], [place] [address], [place], [place]  [address], [place], [place] [address], [place], [place] [address] [place] [address] [place], [place], [place] [address], [place], [place]  [address], [place], [place] [address] [place], [place], [place] [address], [place], [place] (effective 08/03/16) [address] [place], [place] (effective 08/03/16)

## 2021-07-21 NOTE — TOC Initial Note (Signed)
Transition of Care [facility] - Initial/Assessment Note    Patient Details  Name: Roger [name] MRN: [id number] Date of Birth: Jan 04, 1991  Transition of [facility]) CM/SW Contact:    Lockie Pares, RN Phone Number: 07/21/2021, 12:42 PM  Clinical Narrative:                     MATCH for medication assistance done. Will override once script is placed. Highly encourage patient to seek resources and make an appointment on Monday to establish a PCP MATCH placed in patient instructions for printing.   MATCH MEDICATION ASSISTANCE CARD Name: Berlyn Malina ID: [id number] Bin: [id number] RX Group: [id number] Discharge Date:  07/21/2021 Expiration Date:  07/28/2021 (must be filled within 7 days of discharge)    Patient Goals and CMS Choice        Expected Discharge Plan and Services                                                Prior Living Arrangements/Services                       Activities of Daily Living      Permission Sought/Granted                  Emotional Assessment              Admission diagnosis:  Z04.6 Patient Active Problem List   Diagnosis Date Noted   Active intravenous drug use 07/19/2021   Polysubstance abuse (HCC) 07/19/2021   Substance-induced psychotic disorder (HCC) 07/19/2021   PCP:  Pcp, No Pharmacy:   [phone] [place] [place] - [address] [place] [address] Phone: [phone] Fax: [phone]     Social Determinants of Health (SDOH) Interventions    Readmission Risk Interventions     No data to display

## 2022-01-21 ENCOUNTER — Inpatient Hospital Stay (HOSPITAL_COMMUNITY)
Admission: AD | Admit: 2022-01-21 | Payer: Federal, State, Local not specified - Other | Source: Intra-hospital | Admitting: Psychiatry

## 2022-01-21 ENCOUNTER — Encounter (HOSPITAL_COMMUNITY): Payer: Self-pay

## 2022-01-21 ENCOUNTER — Emergency Department (HOSPITAL_COMMUNITY)
Admission: EM | Admit: 2022-01-21 | Discharge: 2022-01-28 | Disposition: A | Discharge status: Other Acute Inpatient Hospital | Payer: 59 | Attending: Emergency Medicine | Admitting: Emergency Medicine

## 2022-01-21 DIAGNOSIS — Z79899 Other long term (current) drug therapy: Secondary | ICD-10-CM | POA: Insufficient documentation

## 2022-01-21 DIAGNOSIS — R000 Tachycardia, unspecified: Secondary | ICD-10-CM | POA: Diagnosis not present

## 2022-01-21 DIAGNOSIS — R69 Illness, unspecified: Secondary | ICD-10-CM | POA: Diagnosis not present

## 2022-01-21 DIAGNOSIS — F19259 Other psychoactive substance dependence with psychoactive substance-induced psychotic disorder, unspecified: Secondary | ICD-10-CM | POA: Insufficient documentation

## 2022-01-21 DIAGNOSIS — F1910 Other psychoactive substance abuse, uncomplicated: Secondary | ICD-10-CM

## 2022-01-21 DIAGNOSIS — F19959 Other psychoactive substance use, unspecified with psychoactive substance-induced psychotic disorder, unspecified: Secondary | ICD-10-CM

## 2022-01-21 DIAGNOSIS — Z20822 Contact with and (suspected) exposure to covid-19: Secondary | ICD-10-CM | POA: Insufficient documentation

## 2022-01-21 DIAGNOSIS — F23 Brief psychotic disorder: Secondary | ICD-10-CM

## 2022-01-21 LAB — COMPREHENSIVE METABOLIC PANEL
ALT: 25 U/L (ref 0–44)
AST: 27 U/L (ref 15–41)
Albumin: 4.5 g/dL (ref 3.5–5.0)
Alkaline Phosphatase: 98 U/L (ref 38–126)
Anion gap: 9 (ref 5–15)
BUN: 25 mg/dL — ABNORMAL HIGH (ref 6–20)
CO2: 26 mmol/L (ref 22–32)
Calcium: 8.8 mg/dL — ABNORMAL LOW (ref 8.9–10.3)
Chloride: 106 mmol/L (ref 98–111)
Creatinine, Ser: 1.1 mg/dL (ref 0.61–1.24)
GFR, Estimated: >60 mL/min (ref >60)
Glucose, Bld: 110 mg/dL — ABNORMAL HIGH (ref 70–99)
Potassium: 4.5 mmol/L (ref 3.5–5.1)
Sodium: 141 mmol/L (ref 135–145)
Total Bilirubin: 0.8 mg/dL (ref 0.3–1.2)
Total Protein: 7.9 g/dL (ref 6.5–8.1)

## 2022-01-21 LAB — CBC WITH DIFFERENTIAL/PLATELET
Abs Immature Granulocytes: 0.24 10*3/uL — ABNORMAL HIGH (ref 0.00–0.07)
Basophils Absolute: 0.1 10*3/uL (ref 0.0–0.1)
Basophils Relative: 0 %
Eosinophils Absolute: 0.1 10*3/uL (ref 0.0–0.5)
Eosinophils Relative: 0 %
HCT: 44.6 % (ref 39.0–52.0)
Hemoglobin: 14.9 g/dL (ref 13.0–17.0)
Immature Granulocytes: 1 %
Lymphocytes Relative: 8 %
Lymphs Abs: 2.1 10*3/uL (ref 0.7–4.0)
MCH: 29.1 pg (ref 26.0–34.0)
MCHC: 33.4 g/dL (ref 30.0–36.0)
MCV: 87.1 fL (ref 80.0–100.0)
Monocytes Absolute: 1.6 10*3/uL — ABNORMAL HIGH (ref 0.1–1.0)
Monocytes Relative: 6 %
Neutro Abs: 21.3 10*3/uL — ABNORMAL HIGH (ref 1.7–7.7)
Neutrophils Relative %: 85 %
Platelets: 346 10*3/uL (ref 150–400)
RBC: 5.12 MIL/uL (ref 4.22–5.81)
RDW: 13.2 % (ref 11.5–15.5)
WBC: 25.4 10*3/uL — ABNORMAL HIGH (ref 4.0–10.5)
nRBC: 0 % (ref 0.0–0.2)

## 2022-01-21 LAB — CBG MONITORING, ED: Glucose-Capillary: 119 mg/dL — ABNORMAL HIGH (ref 70–99)

## 2022-01-21 LAB — ETHANOL: Alcohol, Ethyl (B): <10 mg/dL (ref <10)

## 2022-01-21 LAB — ACETAMINOPHEN LEVEL: Acetaminophen (Tylenol), Serum: <10 ug/mL — ABNORMAL LOW (ref 10–30)

## 2022-01-21 LAB — SALICYLATE LEVEL: Salicylate Lvl: <7 mg/dL — ABNORMAL LOW (ref 7.0–30.0)

## 2022-01-21 MED ORDER — LORAZEPAM 1 MG PO TABS
1.0000 mg | ORAL_TABLET | ORAL | Status: AC | PRN
  Administered 2022-01-22: 1 mg via ORAL
  Filled 2022-01-21 (×2): qty 1

## 2022-01-21 MED ORDER — ZIPRASIDONE MESYLATE 20 MG IM SOLR
20.0000 mg | INTRAMUSCULAR | Status: DC | PRN
  Filled 2022-01-21: qty 20

## 2022-01-21 MED ORDER — ZIPRASIDONE MESYLATE 20 MG IM SOLR: 20.0000 mg | Freq: Once | INTRAMUSCULAR | Status: AC

## 2022-01-21 MED ORDER — LORAZEPAM 2 MG/ML IJ SOLN
INTRAMUSCULAR | Status: AC
  Administered 2022-01-21: 2 mg
  Filled 2022-01-21: qty 1

## 2022-01-21 MED ORDER — OLANZAPINE 5 MG PO TBDP
10.0000 mg | ORAL_TABLET | Freq: Three times a day (TID) | ORAL | Status: DC | PRN
  Administered 2022-01-21 – 2022-01-24 (×5): 10 mg via ORAL
  Filled 2022-01-21 (×6): qty 2

## 2022-01-21 MED ORDER — ZIPRASIDONE MESYLATE 20 MG IM SOLR
INTRAMUSCULAR | Status: AC
  Administered 2022-01-21: 20 mg via INTRAMUSCULAR
  Filled 2022-01-21: qty 20

## 2022-01-21 NOTE — ED Provider Notes (Signed)
[facility] EMERGENCY DEPARTMENT Provider Note   CSN: [id number] Arrival date & time: 01/21/22  0302     History  Chief Complaint  Patient presents with   Altered Mental Status    CECILE GUEVARA is a 31 y.o. male.  Patient is a 31 year old male with past medical history of substance-induced psychosis, polysubstance abuse.  Patient brought by EMS after being found in someone's backyard shirtless and unresponsive.  The home owner called the police who in turn called EMS.  Patient was initially unresponsive except to noxious stimuli.  He was then given Narcan, then transported here.  Shortly before arriving to the ER, patient became agitated and combative and required physical restraint.  He arrives here acutely psychotic, combative, and unable to provide any history or useful information.  While being triaged by the nursing staff, apparently a Ziploc bag with drugs and drug paraphernalia was located in his underwear between his buttocks.  This appears to be marijuana and likely crack cocaine.  The history is provided by the patient.       Home Medications Prior to Admission medications   Medication Sig Start Date End Date Taking? Authorizing Provider  OLANZapine (ZYPREXA) 10 MG tablet Take 1 tablet (10 mg total) by mouth at bedtime. 07/21/21   Benjiman Core, MD      Allergies    Patient has no known allergies.    Review of Systems   Review of Systems  Unable to perform ROS: Psychiatric disorder    Physical Exam Updated Vital Signs BP (!) 127/96 (BP Location: Left Arm)   Pulse (!) 108   Wt 83.4 kg   SpO2 100%   BMI 28.80 kg/m  Physical Exam Vitals and nursing note reviewed.  Constitutional:      Appearance: He is well-developed.     Comments: Patient arrives here combative and acutely psychotic.  He appears to be responding to internal stimuli.  HENT:     Head: Normocephalic and atraumatic.  Cardiovascular:     Rate and Rhythm: Normal rate and regular rhythm.      Heart sounds: No murmur heard.    No friction rub.  Pulmonary:     Effort: Pulmonary effort is normal. No respiratory distress.     Breath sounds: Normal breath sounds. No wheezing or rales.  Abdominal:     General: Bowel sounds are normal. There is no distension.     Palpations: Abdomen is soft.     Tenderness: There is no abdominal tenderness.  Musculoskeletal:        General: Normal range of motion.     Cervical back: Normal range of motion and neck supple.  Skin:    General: Skin is warm and dry.  Neurological:     Mental Status: He is oriented to person, place, and time.     Coordination: Coordination normal.     ED Results / Procedures / Treatments   Labs (all labs ordered are listed, but only abnormal results are displayed) Labs Reviewed  COMPREHENSIVE METABOLIC PANEL  ETHANOL  CBC WITH DIFFERENTIAL/PLATELET  SALICYLATE LEVEL  ACETAMINOPHEN LEVEL  URINALYSIS, ROUTINE W REFLEX MICROSCOPIC  RAPID URINE DRUG SCREEN, HOSP PERFORMED    EKG EKG Interpretation  Date/Time:  Tuesday January 21 2022 03:22:09 EST Ventricular Rate:  102 PR Interval:  136 QRS Duration: 92 QT Interval:  364 QTC Calculation: 475 R Axis:   83 Text Interpretation: Sinus tachycardia Borderline prolonged QT interval Baseline wander in lead(s) V5 V6 Confirmed by  Geoffery Lyons ([id number]) on 01/21/2022 3:24:40 AM  Radiology No results found.  Procedures Procedures    Medications Ordered in ED Medications  ziprasidone (GEODON) injection 20 mg (20 mg Intramuscular Given 01/21/22 0316)  LORazepam (ATIVAN) 2 MG/ML injection (2 mg  Given 01/21/22 0324)    ED Course/ Medical Decision Making/ A&P  Patient is a 31 year old male with past medical history of polysubstance abuse and substance-induced psychosis brought by EMS after being found unresponsive in the backyard of someone's residence.  Patient was apparently wearing no shirt and it was cold outside.  He was initially responsive only to  noxious stimuli, but began behaving violently just prior to arrival to the emergency department.  He was given Narcan on the scene.  Patient arrives here with stable vital signs, but is extremely violent, agitated, and appears to be responding to internal stimuli.  Patient required physical restraint by the nursing staff, security guards and myself.  He was then placed in violent restraints for his own safety and the safety of those caring for him.  He was also given 20 mg of Geodon followed by 2 mg of Ativan.  He has been resting comfortably since.  Workup initiated including laboratory studies.  CBC reveals a white count of 25,000 which I suspect is a stress response related to the situation.  Laboratory studies otherwise unremarkable.    I suspect the cause of his psychosis to be drug-induced.  While patient was being placed into the exam room, a baggy filled with what appeared to be marijuana, crack cocaine, and drug paraphernalia fell out of his underwear.  Patient will require reassessment once the drugs I believe he took and sedative medications given here in the ER have worn off.  It appears as though he has had similar presentations in the past and has been involuntarily committed.  CRITICAL CARE Performed by: Geoffery Lyons Total critical care time: 45 minutes Critical care time was exclusive of separately billable procedures and treating other patients. Critical care was necessary to treat or prevent imminent or life-threatening deterioration. Critical care was time spent personally by me on the following activities: development of treatment plan with patient and/or surrogate as well as nursing, discussions with consultants, evaluation of patient's response to treatment, examination of patient, obtaining history from patient or surrogate, ordering and performing treatments and interventions, ordering and review of laboratory studies, ordering and review of radiographic studies, pulse oximetry  and re-evaluation of patient's condition.   Final Clinical Impression(s) / ED Diagnoses Final diagnoses:  None    Rx / DC Orders ED Discharge Orders     None         Geoffery Lyons, MD 01/21/22 [phone]

## 2022-01-21 NOTE — ED Notes (Signed)
Restraints removed at 0414. Pt is sleeping

## 2022-01-21 NOTE — ED Notes (Signed)
Pt extremely agitated, becoming physical with staff. Staff unable to redirect pt, Dr. Judd Lien at bedside. Orders received

## 2022-01-21 NOTE — ED Notes (Signed)
Pt requested to make a phone call to their grandmother. This RN dialed the number for pt

## 2022-01-21 NOTE — ED Notes (Signed)
Pt is resting at this time  Lita Mains w/ public safety wasted illegal substance in sharps container in front of this nurse and Denese Killings w/ public safety.

## 2022-01-21 NOTE — ED Notes (Signed)
Patient requested to make a call to his aunt Candace. RN called number for the patient and receiver agreed to take the call. Patient remained calm for the entirety of the call. Candace requested to speak with me and requested patient information. Candace is not on the list to receive information. I informed her of that and suggested she get in touch with family that is on the list for information. Candace received that well.

## 2022-01-21 NOTE — ED Triage Notes (Signed)
BIB EMS/ found unresponsive/ law enforcement initially found pt in someone's backyard exhibiting in bizarre behavior/ pt is alert but does not follow commands

## 2022-01-21 NOTE — ED Notes (Signed)
During undressing pt during triage/ a bag of illegal substances found in pts rectum/ public safety alerted

## 2022-01-21 NOTE — ED Provider Notes (Signed)
  Provider Note MRN:  [id number]  Arrival date & time: 01/21/22    ED Course and Medical Decision Making  Assumed care from Dr Judd Lien at shift change.  See note from prior team for complete details, in brief:  31 yo male Found in backyard w/o clothes by some trees Altered Hx polysubstance abuse Unresponsive on arrival for EMS, acutely agitated after narcan, combative. Given 20mg  geodon, 2mg  ativan, violent restraints Had illicit drugs on his person on arrival  Plan per prior physician metabolize, re-assess  12: 00 PM Pt sleeping, will drink a glass of water but falls back to sleep after, may require further metabolization, will recheck   2:40 PM Pt re-assessed, he is able to tolerate po but appears acutely psychotic He is hallucinating, reports seeing 4-5 other people in the room and one is yelling at him; I do not see these other individuals in the room Pt unable to recall events leading up to episode last night or really give much of any history Will place under ivc for acute psychosis Dispo per tts He is medically cleared   Procedures  Final Clinical Impressions(s) / ED Diagnoses     ICD-10-CM   1. Psychoactive substance-induced psychosis [facility])        ED Discharge Orders     None       Discharge Instructions   None        [facility], DO 01/21/22 1442

## 2022-01-22 MED ORDER — ACETAMINOPHEN 325 MG PO TABS
650.0000 mg | ORAL_TABLET | Freq: Once | ORAL | Status: AC
  Administered 2022-01-22: 650 mg via ORAL
  Filled 2022-01-22: qty 2

## 2022-01-22 NOTE — BH Assessment (Signed)
Clinician contacted RN Ivonne Andrew about whether patient would be able ot tolerate doing his teleassessment.  RN said that at this time the patient could not do it.  Will let TTS know when patient can participate.

## 2022-01-22 NOTE — ED Notes (Signed)
Pt hitting himself in the head with fists and remote

## 2022-01-22 NOTE — ED Notes (Signed)
Pt awake and throwing cups in room and keeps slamming his door shut.

## 2022-01-22 NOTE — ED Notes (Signed)
  Patient becoming agitated and states there is someone in the room telling him to throw the table.  Patient requesting medications to help "stop the voices". Giving patient PRN med and will continue to monitor.

## 2022-01-22 NOTE — ED Notes (Signed)
Since taking over this pt at 7 am the pt has slammed the door twice and has thrown the remote.

## 2022-01-22 NOTE — ED Notes (Signed)
Spoke with pt's grandmother and updated her on pt's condition as he is under IVC and grandmother requested to speak with me. Grandmother states "He really needs help in patient" I informed grandmother of TTS process and awaiting on pt to be TTS

## 2022-01-22 NOTE — Consult Note (Addendum)
HPI: Roger [name] is a 31 year old African-American male with past medical history of substance-induced psychosis, polysubstance abuse.  Patient brought by EMS after being found in someone's backyard shirtless and unresponsive.  The home owner called the police who in turn called EMS.  Patient was initially unresponsive except to noxious stimuli.  He was then given Narcan, then transported here.  Shortly before arriving to the ER, patient became agitated and combative and required physical restraint.  He arrives here acutely psychotic, combative, and unable to provide any history or useful information.  While being triaged by the nursing staff, apparently a Ziploc bag with drugs and drug paraphernalia was located in his underwear between his buttocks.  This appears to be marijuana and likely crack cocaine.  Assessment: On assessment via telepsych, patient was awakened to speak with this provider.  He sat up in bed and looking at this provider,  states, "My name is not Roger [name]. "Then added, I am not going to respond to any question."  Patient refused to state his real name. As per nursing staff, patient was identified by his grandmother that he is Roger [name].  Attempted x 2 to complete assessment, but patient will not cooperate.  Alan Mulder, NP Psychiatry

## 2022-01-22 NOTE — BH Assessment (Signed)
@  12:40 Attempted to complete BH assessment.  Patient gives a different name, and repeated this name several times.  He also spelled the name and indicated he has a "cousin named Josh, but [employer]."  Patient appears very groggy at this time.  RN informed that we will need to assess once we can confirm patient's identity and when patient is alert enough to engage in assessment.

## 2022-01-22 NOTE — ED Provider Notes (Signed)
Emergency Medicine Observation Re-evaluation Note  Roger [name] is a 31 y.o. male, seen on rounds today.  Pt initially presented to the ED for complaints of Altered Mental Status Currently, the patient is sleeping in NAD.  Physical Exam  BP (!) 137/95 (BP Location: Right Arm)   Pulse 71   Temp 98.2 F (36.8 C) (Oral)   Resp 15   Wt 83.4 kg   SpO2 100%   BMI 28.80 kg/m  Physical Exam General: Appears to be resting comfortably in bed, no acute distress. Cardiac: Regular rate, normal heart rate, non-emergent blood pressure for this morning's vitals. Lungs: No increased work of breathing.  Equal chest rise appreciated Psych: Calm, asleep in bed.   ED Course / MDM  EKG:EKG Interpretation  Date/Time:  Tuesday January 21 2022 03:22:09 EST Ventricular Rate:  102 PR Interval:  136 QRS Duration: 92 QT Interval:  364 QTC Calculation: 475 R Axis:   83 Text Interpretation: Sinus tachycardia Borderline prolonged QT interval Baseline wander in lead(s) V5 V6 Confirmed by Geoffery Lyons ([id number]) on 01/21/2022 3:24:40 AM  I have reviewed the labs performed to date as well as medications administered while in observation.   Plan  Current plan is for TTS Consultation this AM.    Glyn Ade, MD 01/22/22 [phone]

## 2022-01-22 NOTE — ED Notes (Signed)
Per Sydell Axon, [facility], pt was not able to stay awake during TTS and continued to fall back asleep, states will attempt when pt is more alert

## 2022-01-22 NOTE — ED Notes (Signed)
Attempted to get urine sample as pt went into the bathroom and then stated "too late, I already peed"

## 2022-01-22 NOTE — BH Assessment (Signed)
TTS clinician attempted to complete assessment. During attempt, patient observed to be drowsy and unable to engage. Per Ginger, RN, patient was given medications earlier. TTS will attempt at later time.

## 2022-01-22 NOTE — ED Notes (Addendum)
TTS attempted assessment but pt unable to stay awake to complete assessment. Stated they would try again in the morning.

## 2022-01-23 LAB — CBC WITH DIFFERENTIAL/PLATELET
Abs Immature Granulocytes: 0.05 10*3/uL (ref 0.00–0.07)
Basophils Absolute: 0.1 10*3/uL (ref 0.0–0.1)
Basophils Relative: 1 %
Eosinophils Absolute: 0.3 10*3/uL (ref 0.0–0.5)
Eosinophils Relative: 2 %
HCT: 42.8 % (ref 39.0–52.0)
Hemoglobin: 14 g/dL (ref 13.0–17.0)
Immature Granulocytes: 0 %
Lymphocytes Relative: 25 %
Lymphs Abs: 3.8 10*3/uL (ref 0.7–4.0)
MCH: 28.7 pg (ref 26.0–34.0)
MCHC: 32.7 g/dL (ref 30.0–36.0)
MCV: 87.9 fL (ref 80.0–100.0)
Monocytes Absolute: 1.1 10*3/uL — ABNORMAL HIGH (ref 0.1–1.0)
Monocytes Relative: 7 %
Neutro Abs: 10 10*3/uL — ABNORMAL HIGH (ref 1.7–7.7)
Neutrophils Relative %: 65 %
Platelets: 349 10*3/uL (ref 150–400)
RBC: 4.87 MIL/uL (ref 4.22–5.81)
RDW: 13.2 % (ref 11.5–15.5)
WBC: 15.4 10*3/uL — ABNORMAL HIGH (ref 4.0–10.5)
nRBC: 0 % (ref 0.0–0.2)

## 2022-01-23 NOTE — ED Notes (Signed)
TTS at this time. 

## 2022-01-23 NOTE — ED Notes (Addendum)
Sitter informed nurse that pt was getting agitated. Pt shaking the side rails and hitting them. Pt forcefully scratching his head and shaking it. Pt given prn dose of Zyprexa ODT which he took with no problem. RN asked pt what was bothering him and he stated "the voices and I want to take a shower". Pt informed that if he could settle down we would allow him to walk over to the shower area, but not until he was able to remain calm. Pt shook his head in understanding.

## 2022-01-23 NOTE — ED Provider Notes (Signed)
Emergency Medicine Observation Re-evaluation Note  Roger [name] is a 31 y.o. male, seen on rounds today.  Pt initially presented to the ED for complaints of Altered Mental Status Currently, the patient is resting in NAD.  Physical Exam  BP 118/83 (BP Location: Left Arm)   Pulse 91   Temp 98.3 F (36.8 C) (Oral)   Resp 16   Wt 83.4 kg   SpO2 100%   BMI 28.80 kg/m  Physical Exam General: Appears to be resting comfortably in bed, no acute distress. Cardiac: Regular rate, normal heart rate, non-emergent blood pressure for this morning's vitals. Lungs: No increased work of breathing.  Equal chest rise appreciated Psych: Calm, asleep in bed.   ED Course / MDM  EKG:EKG Interpretation  Date/Time:  Tuesday January 21 2022 03:22:09 EST Ventricular Rate:  102 PR Interval:  136 QRS Duration: 92 QT Interval:  364 QTC Calculation: 475 R Axis:   83 Text Interpretation: Sinus tachycardia Borderline prolonged QT interval Baseline wander in lead(s) V5 V6 Confirmed by Geoffery Lyons ([id number]) on 01/21/2022 3:24:40 AM  I have reviewed the labs performed to date as well as medications administered while in observation.    Plan  Current plan is for TTS consultation.    Glyn Ade, MD 01/23/22 [phone]

## 2022-01-23 NOTE — ED Notes (Signed)
Breakfast tray provided. 

## 2022-01-23 NOTE — ED Notes (Signed)
Dinner tray provided

## 2022-01-23 NOTE — ED Notes (Signed)
Pt has thrown a cup of ice across the room, hitting and shaking the bed . He is rocking the bed and saying, " I'm tired of hearing shit " RN notified

## 2022-01-23 NOTE — Progress Notes (Signed)
Patient has been denied by [facility] due to no appropriate beds available. Patient meets BH inpatient criteria per Alan Mulder, NP. Patient has been faxed out to the following facilities:   [facility]  [address], [place] [address] [phone]  [facility]  [address] [place], [place] [address] [phone]  [facility]  [address] [place] [address] [phone]  [facility]  [address] [place] [address] [phone]  [facility]  [address] [place], [place] [address] [phone]  [facility]  [address] [place] [address] [phone]  [facility]  [address] [place] [address] [phone]  [facility]  [address] [place] [address] [phone]  [facility]  [address], [place] [address] [phone]  [facility]  [address], [place] [address] [phone]  [facility]  [address] [place] [address] [phone]  [facility]  [address] [place], [place] [address] [phone]  [facility]  [address], [place] [address] [phone]  [facility] [address]  [address], [place] [address] [phone]  [facility]  [address], [place] [address] [phone]

## 2022-01-23 NOTE — ED Notes (Signed)
Lunch tray provided. 

## 2022-01-23 NOTE — BH Assessment (Addendum)
Comprehensive Clinical Assessment (CCA) Note  01/23/2022 [name] [id number]  Disposition: TTS assessment completed. Discussed clinicals with the [facility] Provider Alan Mulder, NP) and it was determined that patient meets criteria for inpatient psychiatric treatment. The Disposition Social Worker requested to seek appropriate placement.   Chief Complaint:  Chief Complaint  Patient presents with   Altered Mental Status   Psychiatric Evaluation   Visit Diagnosis:  Psychotic Disorder Anxiety Disorder Rule out Substance Induced Psychosis (pending UDS results)  Roger [name] is a 31 y/o male that presents to [facility]. Patient was asked to confirm two identifiers, name and DOB. He states that his name is "Roger [name]". He denies that "[place]" is his name. Clinician asked patient his DOB and he states, "I can't remember". Followed by stating he can't remember why he is in the Emergency Room or who transported him.   Per chart review and H&P notes: "Patient is a 31 year old male with past medical history of substance-induced psychosis, polysubstance abuse.  Patient brought by EMS after being found in someone's backyard shirtless and unresponsive.  The home owner called the police who in turn called EMS.  Patient was initially unresponsive except to noxious stimuli.  He was then given Narcan, then transported here.  Shortly before arriving to the ER, patient became agitated and combative and required physical restraint.  He arrives here acutely psychotic, combative, and unable to provide any history or useful information.  While being triaged by the nursing staff, apparently a Ziploc bag with drugs and drug paraphernalia was located in his underwear between his buttocks.  This appears to be marijuana and likely crack cocaine."  Clinician proceeded with the assessment. Patient was asked if he has a  mental health history and/or diagnosis and he states, "I don't think so, I cant remember".  However, states that he saw a psychiatrist at [facility], "sometime over the summer" but doesn't recall the reason or much about the occurrence. Despite the poor recollection of events he recalls being psychiatrically admitted.   Patient says that he thinks "People were chasing me". However, he is unable to remember the location of where he was being chased. He is unable to remember if the persons chasing him were people he knew or strangers. States that he doesn't remember when the "people" started chasing him" or if this has occurred prior to the most recent report.  Patient denies a history and/or current thoughts of suicide. Denies history of self-injurious behaviors. Denies history of depressive symptoms. No access to firearms. However, acknowledges that he has intermittent symptoms of anxiety and panic attacks. He doesn't recall the date of his most recent panic attack. No reported issues with his appetite. No significant weight loss and/or gain. He reports issues with insomnia. However, unable to provide an average number of hours he sleeps her night.   Denies history and/or current of homicidal ideations. Denies history of aggressive and/or assaultive behaviors. No legal issues. Denies that he is on probation, parole, and doesn't have any upcoming court dates.   He reports auditory hallucinations, "They tell me to flip tables over". Also, visual hallucinations, "People talking, many people, it's a lot of them" and tactile hallucinations of "People touching me". He is unable to recall the onset of his related symptoms. Denies that he is experiencing hallucinations at the time of the assessment.  Denies history or current use of alcohol and/or drugs. However, the chart review indicates a history of THC.   Patient is single.  He initially denies that he has any children. However, later says he has 4 children. Currently homeless and became homeless 1-2 years ago. Clinician proceeded in asking CCA  related questions. However, patient refused to continue participating. He stated, "I'm sleepy". He turned his back to the camera and appears to have gone to sleep. Clinician called his name several times and he did no respond. His nurse was made aware.   CCA Screening, Triage and Referral (STR)  Patient Reported Information How did you hear about us? Legal System  What Is the Reason for Your Visit/Call Today? Patient is a 31 year old male with past medical history of substance-induced psychosis, polysubstance abuse.  Patient brought by EMS after being found in someone's backyard shirtless and unresponsive.  The home owner called the police who in turn called EMS.  Patient was initially unresponsive except to noxious stimuli.  He was then given Narcan, then transported here.  Shortly before arriving to the ER, patient became agitated and combative and required physical restraint.  He arrives here acutely psychotic, combative, and unable to provide any history or useful information.  While being triaged by the nursing staff, apparently a Ziploc bag with drugs and drug paraphernalia was located in his underwear between his buttocks.  This appears to be marijuana and likely crack cocaine.  How Long Has This Been Causing You Problems? -- (Patient states, "I don't know".)  What Do You Feel Would Help You the Most Today? Treatment for Depression or other mood problem   Have You Recently Had Any Thoughts About Hurting Yourself? No  Are You Planning to Commit Suicide/Harm Yourself At This time? No   Flowsheet Row [facility]  C-SSRS RISK CATEGORY No Risk No Risk       Have you Recently Had Thoughts About Hurting Someone Karolee Ohslse? No  Are You Planning to Harm Someone at This Time? No  Explanation: Patient answers "No" to the related question.   Have You Used Any Alcohol or Drugs in the  Past 24 Hours? No  What Did You Use and How Much? Patient denies. However, acknowledged use of THC per chart review (6 months ago).   Do You Currently Have a Therapist/Psychiatrist? No  Name of Therapist/Psychiatrist: Name of Therapist/Psychiatrist: Denies that he has a therapist and/or psychiatrist.   Have You Been Recently Discharged From Any Office Practice or Programs? No  Explanation of Discharge From Practice/Program: Patient denies.     CCA Screening Triage Referral Assessment Type of Contact: Tele-Assessment  Telemedicine Service Delivery: Telemedicine service delivery: This service was provided via telemedicine using a 2-way, interactive audio and video technology  Is this Initial or Reassessment? Is this Initial or Reassessment?: Initial Assessment  Date Telepsych consult ordered in [facility]:  Date Telepsych consult ordered in [facility]: 01/23/22  Time [facility]psych [facility]:    Location of Assessment: [facility]  [facility]   Collateral Involvement: Gaylan GeroldCherry, Aunt: [phone]: [phone]   Does Patient Have a Court Appointed Legal Guardian? No  Legal Guardian Contact Information: No legal guardian reported by patient.  Copy of Legal Guardianship Form: No - copy requested  Legal Guardian Notified of Arrival: -- (N/A)  Legal Guardian Notified of Pending Discharge: -- (N/A)  If Minor and Not Living with Parent(s), Who has Custody? N/A  Is CPS involved or ever been involved? Never  Is APS involved or ever been involved? Never  Patient Determined To Be At Risk for Harm To Self or Others Based on Review of Patient Reported Information or Presenting Complaint? No  Method: -- (N/A)  Availability of Means: No access or NA (No access to means.)  Intent: Vague intent or NA (N/A)  Notification Required: -- (N/A, patient denied HI in the related question.)  Additional Information for Danger to Others Potential: -- (Denies; currently  calm.)  Additional Comments for Danger to Others Potential: Patient denies HI.  Are There Guns or Other Weapons in Your Home? No  Types of Guns/Weapons: Patient denies.  Are These Weapons Safely Secured?                            -- (No weapons as it relates to the related question.)  Who Could Verify You Are Able To Have These Secured: N/A  Do You Have any Outstanding Charges, Pending Court Dates, Parole/Probation? N/A  Contacted To Inform of Risk of Harm To Self or Others: Other: Comment (N/A; Patient denies in the related question.)    Does Patient Present under Involuntary Commitment? Yes    [place] of Residence: [place]   Patient Currently Receiving the Following Services: -- (Patient denies that he is receiving any services at this time.)   Determination of Need: Urgent (48 hours)   Options For Referral: Medication Management; Inpatient Hospitalization     CCA Biopsychosocial Patient Reported Schizophrenia/Schizoaffective Diagnosis in Past: No   Strengths: Patient responds, "I don't know".   Mental Health Symptoms Depression:   None   Duration of Depressive symptoms:    Mania:   None   Anxiety:    Tension; Difficulty concentrating   Psychosis:   Hallucinations   Duration of Psychotic symptoms:  Duration of Psychotic Symptoms: N/A (Patient says that he doesn't rember the onset of symptoms.)   Trauma:   None   Obsessions:   None   Compulsions:   None   Inattention:   None   Hyperactivity/Impulsivity:   None   Oppositional/Defiant Behaviors:   None (History of aggression, anger issues, etc., per chart review. Patient denies history in this assessment.)   Emotional Irregularity:   Potentially harmful impulsivity   Other Mood/Personality Symptoms:   Patient is guarded with responses. He was initially cooperative. Followed by refusing to participate in the assessment. He turned his back to the camera and put the sheets over him.  Appears that he went to sleep. Called his name sevearl times and refused to respond.    Mental Status Exam Appearance and self-care  Stature:   Average   Weight:   Average weight   Clothing:   -- (dressed in hospital scrubs)   Grooming:   Normal   Cosmetic use:   None   Posture/gait:   Normal   Motor activity:   Not Remarkable   Sensorium  Attention:   Inattentive   Concentration:   Normal   Orientation:   X5   Recall/memory:   Defective in Immediate; Defective in Short-term; Defective in Recent; Defective in Remote   Affect and Mood  Affect:   Flat; Blunted; Other (Comment)   Mood:   Anxious   Relating  Eye contact:   Normal   Facial expression:   Constricted   Attitude toward examiner:   Guarded   Thought and Language  Speech flow:  Articulation error   Thought content:   Appropriate to Mood and Circumstances   Preoccupation:  None   Hallucinations:   Auditory; Visual   Organization:   Development worker, international aid of Knowledge:   Average   Intelligence:   Average   Abstraction:   Abstract   Judgement:   Poor   Reality Testing:   Variable   Insight:   Poor   Decision Making:   Impulsive   Social Functioning  Social Maturity:   Impulsive   Social Judgement:   Naive; Heedless   Stress  Stressors:   Family conflict; Grief/losses; Housing; Metallurgist; Work   Coping Ability:   Overwhelmed; Exhausted   Skill Deficits:   Communication; Decision making; Interpersonal; Self-care; Self-control; Responsibility   Supports:   Family; Friends/Service system     Religion: Religion/Spirituality Are You A Religious Person?: Yes What is Your Religious Affiliation?: Christian How Might This Affect Treatment?: Not assessed  Leisure/Recreation: Leisure / Recreation Do You Have Hobbies?: No  Exercise/Diet: Exercise/Diet Do You Exercise?: No Have You Gained or Lost A Significant Amount of Weight  in the Past Six Months?: No Do You Follow a Special Diet?: No Do You Have Any Trouble Sleeping?: Yes Explanation of Sleeping Difficulties: Pt shares he has trouble both falling and staying asleep.   CCA Employment/Education Employment/Work Situation: Employment / Work Situation Employment Situation: Unemployed Patient's Job has Been Impacted by Current Illness: No Describe how Patient's Job has Been Impacted: Patient states he is unemployed. Has Patient ever Been in the [employer]?: No  Education: Education Is Patient Currently Attending School?: No Did You Have An Individualized Education Program (IIEP): No Did You Have Any Difficulty At School?: No Patient's Education Has Been Impacted by Current Illness: No   CCA Family/Childhood History Family and Relationship History: Family history Marital status: Single Does patient have children?: Yes How many children?: 4 How is patient's relationship with their children?: Pt states, "I do the best I can."  Childhood History:  Childhood History By whom was/is the patient raised?: Grandparents Did patient suffer any verbal/emotional/physical/sexual abuse as a child?: No Did patient suffer from severe childhood neglect?: No Has patient ever been sexually abused/assaulted/raped as an adolescent or adult?: No Was the patient ever a victim of a crime or a disaster?: No Has patient been affected by domestic violence as an adult?: No       CCA Substance Use Alcohol/Drug Use: Alcohol / Drug Use Pain Medications: See MAR Prescriptions: See MAR Over the Counter: See MAR History of alcohol / drug use?: No history of alcohol / drug abuse (Patient denies a history of substance use. However, per chart review from a previous visit reported a prior history of THC use.) Longest period of sobriety (when/how long): Unknown Negative Consequences of Use:  (Patient denies history of subtance use.) Withdrawal Symptoms: None                          ASAM's:  Six Dimensions of Multidimensional Assessment  Dimension 1:  Acute Intoxication and/or Withdrawal Potential:   Dimension 1:  Description of individual's past and current experiences of substance use and withdrawal: Pt denies; no known clean time to determine this.  Dimension 2:  Biomedical Conditions and Complications:   Dimension 2:  Description of patient's biomedical conditions and  complications: Pt denies any biomedical conditions and complications  Dimension 3:  Emotional, Behavioral, or Cognitive Conditions and Complications:  Dimension 3:  Description of emotional, behavioral, or cognitive conditions and complications: Pt was hallucinating, paranoid, and  combative, requiring IVC.  Dimension 4:  Readiness to Change:     Dimension 5:  Relapse, Continued use, or Continued Problem Potential:     Dimension 6:  Recovery/Living Environment:     ASAM Severity Score: ASAM's Severity Rating Score: 0  ASAM Recommended Level of Treatment:     Substance use Disorder (SUD) Substance Use Disorder (SUD)  Checklist Symptoms of Substance Use: Social, occupational, recreational activities given up or reduced due to use (N/A: Patient denies current use of substances and history of use.)  Recommendations for Services/Supports/Treatments: Recommendations for Services/Supports/Treatments Recommendations For Services/Supports/Treatments: Inpatient Hospitalization, Medication Management  Discharge Disposition:    DSM5 Diagnoses: Patient Active Problem List   Diagnosis Date Noted   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

## 2022-01-23 NOTE — Progress Notes (Signed)
LCSW Progress Note  [id number]   Roger [name]  01/23/2022  12:32 PM  Description:   Inpatient Psychiatric Referral  Patient was recommended inpatient per Alan Mulder, NP. Patient was referred to the following facilities:   Destination Service Provider Address Phone Fax  [facility]  [address] [place] [address] [phone]  [facility]  [address] [place], [place] [address] [phone]  [facility]  [address] [place] [address] [phone]  [facility]  [address] [place] [address] [phone]  [facility]  [address] [place], [place] [address] [phone]  [facility]  [address] [place] [address] [phone]  [facility]  [address], [place] [address] [phone]  [facility]  [address] [place] [address] [phone]  [facility]  [address] [place] [address] [phone]  [facility]  [address], [place] [address] [phone]  [facility]  [address], [place] [address] [phone]  [facility]  [address] [place], [place] [address] [phone]  [facility]  [address], [place] [address] [phone]  [facility] [address]  [address] [place] [address] [phone]  [facility]  [address], [place] [address] [phone]     Situation ongoing, CSW to continue following and update chart as more information becomes available.      Asencion Islam   01/23/2022 12:32 PM

## 2022-01-24 DIAGNOSIS — R69 Illness, unspecified: Secondary | ICD-10-CM | POA: Diagnosis not present

## 2022-01-24 DIAGNOSIS — F1994 Other psychoactive substance use, unspecified with psychoactive substance-induced mood disorder: Secondary | ICD-10-CM | POA: Diagnosis not present

## 2022-01-24 NOTE — Progress Notes (Signed)
CS[facility] to follow [facility] on referral for [facility] inpatient treatment. CSW was unable to speak with admissions coordinator, but did leave a voicemail inquiring about pt status. CSW will continue to assist and follow with placement.  Asencion Islam  01/24/2022 3:37 PM

## 2022-01-24 NOTE — Consult Note (Signed)
Telepsych Consultation   Reason for Consult:  TTS Referring Physician:  Sloan Leiter, DO  Location of Patient: [facility] [phone] Location of Provider: Behavioral Health TTS Department  Patient Identification: [name] MRN:  [id number] Principal Diagnosis: Substance-induced psychotic disorder [facility]) Diagnosis:  Principal Problem:   Substance-induced psychotic disorder (HCC) Active Problems:   Polysubstance abuse (HCC)   Total Time spent with patient: 30 minutes  Subjective:   Roger [name] is a 31 y.o. male patient admitted with .  Patient initially. presents laying in bed asleep. Chest rising and falling appropriately. Slow to respond to verbal commands. Upon awakening patient states his name is 'Roger [name]' and that he's 'related to Cristino Martes'. See EDRN note below. Patient responds  "I don't know" regarding majority of the questions. He faces the screen. Poor eye contact. Blunt affect. Uncooperative. Reports history of being in a psychiatric facility "during the summertime I think. [facility] I think. I don't wanna talk about it". He denies any thoughts of wanting to harm himself or anyone, auditory or visual hallucinations. He denies any substance use; has refused urine sample for UDS thus far. He rolled over in bed and pulled sheet on head stopping assessment.    Per ED RN Note 01/24/22 0927:'TTS assessment in progress, patient not willing to participate. Will not answer questions. NP states that she will reassess later. Patient reports to TTS that his name is not Cristino Martes. Called grandmother and verified that his name and DOB are correct'.   HPI:  [name] is a 31 year old male patient with past psychiatric history of polysubstance abuse including IVDU and substance induced psychotic disorder who presented to [facility] via [facility] after being found unresponsive in someone's backyard with bizarre behavior. Upon arrival patient was noted to be agitated and not following commands  requiring medication and restraints. Once on the unit patient was found with illegal substances in his rectum requiring law enforcement notification. UDS remains outstanding. BAL<10.   Past Psychiatric History: polysubstance abuse, IVDU, substance induced psychotic disorder   Risk to Self:  Risk to Others:  Prior Inpatient Therapy:  Prior Outpatient Therapy:   Past Medical History:  Past Medical History:  Diagnosis Date   MRSA (methicillin resistant Staphylococcus aureus)    Scoliosis    History reviewed. No pertinent surgical history. Family History:  Family History  Problem Relation Age of Onset   Diabetes Mother    Family Psychiatric History: not noted Social History:  Social History   Substance and Sexual Activity  Alcohol Use No     Social History   Substance and Sexual Activity  Drug Use No    Social History   Socioeconomic History   Marital status: Single    Spouse name: Not on file   Number of children: Not on file   Years of education: Not on file   Highest education level: Not on file  Occupational History   Not on file  Tobacco Use   Smoking status: Every Day    Packs/day: 1.00    Years: 13.00    Total pack years: 13.00    Types: Cigarettes   Smokeless tobacco: Never  Vaping Use   Vaping Use: Never used  Substance and Sexual Activity   Alcohol use: No   Drug use: No   Sexual activity: Yes    Birth control/protection: None  Other Topics Concern   Not on file  Social History Narrative   Not on file  Social Determinants of Health   Financial Resource Strain: Not on file  Food Insecurity: Not on file  Transportation Needs: Not on file  Physical Activity: Not on file  Stress: Not on file  Social Connections: Not on file   Additional Social History:  Allergies:  No Known Allergies  Labs:  Results for orders placed or performed during the hospital encounter of 01/21/22 (from the past 48 hour(s))  CBC with Differential     Status:  Abnormal   Collection Time: 01/23/22  7:40 PM  Result Value Ref Range   WBC 15.4 (H) 4.0 - 10.5 K/uL   RBC 4.87 4.22 - 5.81 MIL/uL   Hemoglobin 14.0 13.0 - 17.0 g/dL   HCT 45.442.8 09.839.0 - 11.952.0 %   MCV 87.9 80.0 - 100.0 fL   MCH 28.7 26.0 - 34.0 pg   MCHC 32.7 30.0 - 36.0 g/dL   RDW 14.713.2 82.911.5 - 56.215.5 %   Platelets 349 150 - 400 K/uL   nRBC 0.0 0.0 - 0.2 %   Neutrophils Relative % 65 %   Neutro Abs 10.0 (H) 1.7 - 7.7 K/uL   Lymphocytes Relative 25 %   Lymphs Abs 3.8 0.7 - 4.0 K/uL   Monocytes Relative 7 %   Monocytes Absolute 1.1 (H) 0.1 - 1.0 K/uL   Eosinophils Relative 2 %   Eosinophils Absolute 0.3 0.0 - 0.5 K/uL   Basophils Relative 1 %   Basophils Absolute 0.1 0.0 - 0.1 K/uL   Immature Granulocytes 0 %   Abs Immature Granulocytes 0.05 0.00 - 0.07 K/uL    Comment: Performed at [facility], [address] [place], [place] [address]  Resp panel by RT-PCR (RSV, Flu A&B, Covid) Anterior Nasal Swab     Status: None   Collection Time: 01/25/22  7:37 AM   Specimen: Anterior Nasal Swab  Result Value Ref Range   SARS Coronavirus 2 by RT PCR NEGATIVE NEGATIVE    Comment: (NOTE) SARS-CoV-2 target nucleic acids are NOT DETECTED.  The SARS-CoV-2 RNA is generally detectable in upper respiratory specimens during the acute phase of infection. The lowest concentration of SARS-CoV-2 viral copies this assay can detect is 138 copies/mL. A negative result does not preclude SARS-Cov-2 infection and should not be used as the sole basis for treatment or other patient management decisions. A negative result may occur with  improper specimen collection/handling, submission of specimen other than nasopharyngeal swab, presence of viral mutation(s) within the areas targeted by this assay, and inadequate number of viral copies(<138 copies/mL). A negative result must be combined with clinical observations, patient history, and epidemiological information. The expected result is Negative.  Fact Sheet  for Patients:  BloggerCourse.comhttps://www.fda.gov/media/152166/download  Fact Sheet for Healthcare Providers:  SeriousBroker.ithttps://www.fda.gov/media/152162/download  This test is no t yet approved or cleared by the [place] FDA and  has been authorized for detection and/or diagnosis of SARS-CoV-2 by FDA under an Emergency Use Authorization (EUA). This EUA will remain  in effect (meaning this test can be used) for the duration of the COVID-19 declaration under Section 564(b)(1) of the Act, 21 U.S.C.section 360bbb-3(b)(1), unless the authorization is terminated  or revoked sooner.       Influenza A by PCR NEGATIVE NEGATIVE   Influenza B by PCR NEGATIVE NEGATIVE    Comment: (NOTE) The Xpert Xpress SARS-CoV-2/FLU/RSV plus assay is intended as an aid in the diagnosis of influenza from Nasopharyngeal swab specimens and should not be used as a sole basis for treatment. Nasal washings  and aspirates are unacceptable for Xpert Xpress SARS-CoV-2/FLU/RSV testing.  Fact Sheet for Patients: BloggerCourse.com  Fact Sheet for Healthcare Providers: SeriousBroker.it  This test is not yet approved or cleared by the [place] FDA and has been authorized for detection and/or diagnosis of SARS-CoV-2 by FDA under an Emergency Use Authorization (EUA). This EUA will remain in effect (meaning this test can be used) for the duration of the COVID-19 declaration under Section 564(b)(1) of the Act, 21 U.S.C. section 360bbb-3(b)(1), unless the authorization is terminated or revoked.     Resp Syncytial Virus by PCR NEGATIVE NEGATIVE    Comment: (NOTE) Fact Sheet for Patients: BloggerCourse.com  Fact Sheet for Healthcare Providers: SeriousBroker.it  This test is not yet approved or cleared by the [place] FDA and has been authorized for detection and/or diagnosis of SARS-CoV-2 by FDA under an Emergency Use  Authorization (EUA). This EUA will remain in effect (meaning this test can be used) for the duration of the COVID-19 declaration under Section 564(b)(1) of the Act, 21 U.S.C. section 360bbb-3(b)(1), unless the authorization is terminated or revoked.  Performed at [facility], [address], [place], [place] [address]    Medications:  Current Facility-Administered Medications  Medication Dose Route Frequency Provider Last Rate Last Admin   OLANZapine (ZYPREXA) tablet 5 mg  5 mg Oral Daily Leevy-Johnson, Koriana Stepien A, NP       OLANZapine zydis (ZYPREXA) disintegrating tablet 10 mg  10 mg Oral Q8H PRN Gloris Manchester, MD   10 mg at 01/24/22 1436   And   ziprasidone (GEODON) injection 20 mg  20 mg Intramuscular PRN Gloris Manchester, MD       No current outpatient medications on file.   Musculoskeletal: Strength & Muscle Tone: within normal limits Gait & Station: normal Patient leans: N/A  Psychiatric Specialty Exam:  Presentation  General Appearance:  Casual; Disheveled  Eye Contact: Minimal  Speech: Clear and Coherent  Speech Volume: Normal  Handedness: Right  Mood and Affect  Mood: Dysphoric  Affect: Restricted  Thought Process  Thought Processes: Coherent; Goal Directed  Descriptions of Associations:Intact  Orientation:Full (Time, Place and Person)  Thought Content:Other (comment) (unable to fully assess)  History of Schizophrenia/Schizoaffective disorder:No  Duration of Psychotic Symptoms:N/A  Hallucinations:Hallucinations: None  Ideas of Reference:None  Suicidal Thoughts:Suicidal Thoughts: No  Homicidal Thoughts:Homicidal Thoughts: No   Sensorium  Memory: -- (unable to fully assess; patient refusing to participate)  Judgment: Poor  Insight: Poor  Executive Functions  Concentration: Poor  Attention Span: Poor  Recall: Poor  Fund of Knowledge: Fair  Language: Fair  Psychomotor Activity  Psychomotor Activity:Psychomotor Activity:  Normal   Assets  Assets: Manufacturing systems engineer; Desire for Improvement; Housing; Physical Health; Resilience  Sleep  Sleep:Sleep: Good Number of Hours of Sleep: 10   Physical Exam: Physical Exam Vitals and nursing note reviewed.  Constitutional:      Appearance: Normal appearance. He is normal weight.  HENT:     Head: Normocephalic.     Nose: Nose normal.     Mouth/Throat:     Pharynx: Oropharynx is clear.  Eyes:     Pupils: Pupils are equal, round, and reactive to light.  Cardiovascular:     Rate and Rhythm: Normal rate.     Pulses: Normal pulses.  Pulmonary:     Effort: Pulmonary effort is normal.  Abdominal:     Palpations: Abdomen is soft.  Musculoskeletal:        General: Normal range of motion.  Cervical back: Normal range of motion.  Skin:    General: Skin is dry.  Neurological:     Mental Status: He is oriented to person, place, and time.  Psychiatric:        Attention and Perception: He does not perceive auditory or visual hallucinations.        Mood and Affect: Affect is flat and inappropriate.        Behavior: Behavior is uncooperative and withdrawn.        Thought Content: Thought content is not paranoid or delusional. Thought content does not include homicidal or suicidal ideation. Thought content does not include homicidal or suicidal plan.        Judgment: Judgment is impulsive.    Review of Systems  Psychiatric/Behavioral:  Positive for substance abuse. Negative for depression and suicidal ideas.   All other systems reviewed and are negative.  Blood pressure 130/89, pulse (!) 54, temperature 97.9 F (36.6 C), temperature source Oral, resp. rate 16, weight 83.4 kg, SpO2 100 %. Body mass index is 28.8 kg/m.  Treatment Plan Summary: Daily contact with patient to assess and evaluate symptoms and progress in treatment, Medication management, and Plan    Disposition: Recommend psychiatric Inpatient admission when medically cleared. Supportive  therapy provided about ongoing stressors. Refer to IOP. Discussed crisis plan, support from social network, calling 911, coming to the Emergency Department, and calling Suicide Hotline.  This service was provided via telemedicine using a 2-way, interactive audio and video technology.  Names of all persons participating in this telemedicine service and their role in this encounter. Name: Maxie Barb Role: PMHNP  Name: Phineas Inches Role: Attending MD  Name: Roger [name] Role: patient  Name:  Role:     Loletta Parish, NP 01/25/2022 9:54 AM

## 2022-01-24 NOTE — ED Notes (Signed)
Pt has repeatedly refused vital signs

## 2022-01-24 NOTE — Progress Notes (Signed)
Patient has been denied by [facility] due to no appropriate beds available. Patient meets BH inpatient criteria per Alan Mulder, NP. Patient has been faxed out to the following facilities:   [facility]  [address], [place] [address] [phone]  [facility]  [address] [place], [place] [address] [phone]  [facility]  [address] [place] [address] [phone]  [facility]  [address] [place] [address] [phone]  [facility]  [address] [place], [place] [address] [phone]  [facility]  [address] [place] [address] [phone]  [facility]  [address], [place] [address] [phone]  [facility]  [address] [place] [address] [phone]  [facility]  [address] [place] [address] [phone]  [facility]  [address] [place] [address] [phone]  [facility]  [address] [place] [address] [phone]  [facility]  [address] [place], [place] [address] [phone]  [facility]  [address], [place] [address] [phone]  [facility] [address]  [address] [place] [address] [phone]  [facility]  [address] [place] [address] [phone]  Damita Dunnings, MSW, LCSW-A  9:39 PM 01/24/2022

## 2022-01-24 NOTE — ED Notes (Addendum)
TTS assessment in progress, patient not willing to participate. Will not answer questions. NP states that she will reassess later. Patient reports to TTS that his name is not Cristino Martes. Called grandmother and verified that his name and DOB are correct per our records.

## 2022-01-24 NOTE — ED Notes (Signed)
Grandmother updated on patient condition.

## 2022-01-24 NOTE — ED Notes (Signed)
Pt sleeping at this time- nurse asked sitter to obtain vitals on pt

## 2022-01-25 LAB — URINALYSIS, ROUTINE W REFLEX MICROSCOPIC
Bilirubin Urine: NEGATIVE
Glucose, UA: NEGATIVE mg/dL
Hgb urine dipstick: NEGATIVE
Ketones, ur: NEGATIVE mg/dL
Leukocytes,Ua: NEGATIVE
Nitrite: NEGATIVE
Protein, ur: NEGATIVE mg/dL
Specific Gravity, Urine: 1.017 (ref 1.005–1.030)
pH: 6 (ref 5.0–8.0)

## 2022-01-25 LAB — RESP PANEL BY RT-PCR (RSV, FLU A&B, COVID)  RVPGX2
Influenza A by PCR: NEGATIVE
Influenza B by PCR: NEGATIVE
Resp Syncytial Virus by PCR: NEGATIVE
SARS Coronavirus 2 by RT PCR: NEGATIVE

## 2022-01-25 LAB — RAPID URINE DRUG SCREEN, HOSP PERFORMED
Amphetamines: NOT DETECTED
Barbiturates: NOT DETECTED
Benzodiazepines: NOT DETECTED
Cocaine: NOT DETECTED
Opiates: NOT DETECTED
Tetrahydrocannabinol: POSITIVE — AB

## 2022-01-25 MED ORDER — GABAPENTIN 100 MG PO CAPS
100.0000 mg | ORAL_CAPSULE | Freq: Three times a day (TID) | ORAL | Status: DC
  Administered 2022-01-25 – 2022-01-27 (×9): 100 mg via ORAL
  Filled 2022-01-25 (×9): qty 1

## 2022-01-25 MED ORDER — ZIPRASIDONE MESYLATE 20 MG IM SOLR: 20.0000 mg | INTRAMUSCULAR | Status: DC | PRN

## 2022-01-25 MED ORDER — RISPERIDONE 1 MG PO TBDP
2.0000 mg | ORAL_TABLET | Freq: Three times a day (TID) | ORAL | Status: DC | PRN
  Administered 2022-01-25 – 2022-01-26 (×3): 2 mg via ORAL
  Filled 2022-01-25 (×4): qty 2

## 2022-01-25 MED ORDER — OLANZAPINE 5 MG PO TABS: 5.0000 mg | ORAL_TABLET | Freq: Every day | ORAL | Status: DC

## 2022-01-25 MED ORDER — OLANZAPINE 5 MG PO TABS: 10.0000 mg | ORAL_TABLET | Freq: Every day | ORAL | Status: DC

## 2022-01-25 MED ORDER — LORAZEPAM 1 MG PO TABS
1.0000 mg | ORAL_TABLET | ORAL | Status: AC | PRN
  Administered 2022-01-25: 1 mg via ORAL
  Filled 2022-01-25: qty 1

## 2022-01-25 NOTE — Progress Notes (Signed)
Per Lequita Asal, patient meets criteria for inpatient treatment. There are no available beds at [facility] today. CSW faxed referrals to the following facilities for review:  [facility]  Pending - Request Sent N/A [address] [place] [address] [phone] --  [facility]  Pending - Request Sent N/A [address] [place], [place] [address] [phone] --  [facility]  Pending - Request Sent N/A [address] [place] [address] [phone] --  [facility]  Pending - Request Sent N/A [address] [place] [address] [phone] --  [facility]  Pending - Request Sent N/A [address] [place], [place] [address] [phone] --  [facility]  Pending - Request Sent N/A [address] [place] [address] [phone] --  [facility]  Pending - Request Sent N/A [address] [place] [address] [phone] --  [facility]  Pending - Request Sent N/A [address] [place] [address] [phone] --  [facility]  Pending - Request Sent N/A [address] [place] [address] [phone] --  [facility]  Pending - Request Sent N/A [address] [place] [address] [phone] --  [facility]  Pending - Request Sent N/A [address] [name] [place] [address] [phone] --  [facility]  Pending - Request Sent N/A [address] [place], [place] [address] [phone] --  [facility]  Pending - Request Sent N/A [address] [place] [address] [phone] --  [facility]  Pending - Request Sent N/A  [address] [place] [address] [phone] --  [facility]  Pending - Request Sent N/A [address], [place] [address] [phone] --  [facility]  Pending - Request Sent N/A [address], [place] [address] [phone] --  [facility]  Pending - Request Sent N/A [address] [place] [address] [phone] --  [facility]  Pending - Request Sent N/A [address] [place] [address] [phone] --  [facility]  Pending - Request Sent N/A [address] [place] [address] [phone] --  [facility]  Pending - Request Sent N/A [address] [place] [address] [phone] --  [facility]  Pending - Request Sent N/A [address], [place] [address] [phone] --   TTS will continue to seek bed placement.  Crissie Reese, MSW, [place] Phone: [phone] Disposition/TOC

## 2022-01-25 NOTE — Progress Notes (Signed)
[facility].    Crissie Reese, MSW, [place], [place] Phone: [phone] Disposition/TOC

## 2022-01-25 NOTE — Progress Notes (Signed)
CSW spoke with admissions with [facility]. Labs were requested for further review.   Crissie Reese, MSW, [place] Phone: [phone] Disposition/TOC

## 2022-01-25 NOTE — ED Notes (Signed)
Pt woke up and came to door and requested graham crackers and peanut butter to which sitter provided. Approximately five mins later the pt came back to door stating he wanted to shower. Previous sitter stated that pt had a shower around 11 am. Sitter explained to pt that he has already had a shower today. Pt then proceeded to urinate in the floor stating that sitter has "pissed me off so I will piss in the floor"

## 2022-01-25 NOTE — ED Notes (Signed)
Pt reported that he has a needle in his right arm, that was broken from a syringe while using IV substances. The needle has been there for ~54mo. It is palpable and there is mild discoloration of the skin immediately surrounding the needle. No pain reported.

## 2022-01-25 NOTE — Consult Note (Cosign Needed)
Telepsych Consultation   Reason for Consult:  TTS Referring Physician:  Sloan Leiter, DO  Location of Patient: [facility] [phone] Location of Provider: Behavioral Health TTS Department  Patient Identification: SAHAS SLUKA MRN:  [id number] Principal Diagnosis: Substance-induced psychotic disorder [facility]) Diagnosis:  Principal Problem:   Substance-induced psychotic disorder (HCC) Active Problems:   Polysubstance abuse (HCC)   Total Time spent with patient: 30 minutes  Subjective:   JAXZEN VANHORN is a 31 y.o. male patient admitted with substance induced psychotic disorder.  Patient presents laying in bed. Patient responds  "I don't know" or refuses to answer majority of assessment questions. Limited eye contact. Blunt affect. Uncooperative. He denies any thoughts of wanting to harm himself or anyone, auditory or visual hallucinations. He denies any substance use; he continues to refuse urine sample for UDS. Assessment incomplete due to patient incorporation. He does deny any suicidal or homicidal ideations   HPI:  DARLY FAILS is a 31 year old male patient with past psychiatric history of polysubstance abuse including IVDU and substance induced psychotic disorder who presented to [facility] via EMS after being found unresponsive in someone's backyard with bizarre behavior. Upon arrival patient was noted to be agitated and not following commands requiring medication and restraints. Once on the unit patient was found with illegal substances in his rectum requiring law enforcement notification. UDS remains outstanding. BAL<10.   Past Psychiatric History: polysubstance abuse, IVDU, substance induced psychotic disorder   Risk to Self:  Risk to Others:  Prior Inpatient Therapy:  Prior Outpatient Therapy:   Past Medical History:  Past Medical History:  Diagnosis Date   MRSA (methicillin resistant Staphylococcus aureus)    Scoliosis    History reviewed. No pertinent surgical history. Family  History:  Family History  Problem Relation Age of Onset   Diabetes Mother    Family Psychiatric History: not noted Social History:  Social History   Substance and Sexual Activity  Alcohol Use No     Social History   Substance and Sexual Activity  Drug Use No    Social History   Socioeconomic History   Marital status: Single    Spouse name: Not on file   Number of children: Not on file   Years of education: Not on file   Highest education level: Not on file  Occupational History   Not on file  Tobacco Use   Smoking status: Every Day    Packs/day: 1.00    Years: 13.00    Total pack years: 13.00    Types: Cigarettes   Smokeless tobacco: Never  Vaping Use   Vaping Use: Never used  Substance and Sexual Activity   Alcohol use: No   Drug use: No   Sexual activity: Yes    Birth control/protection: None  Other Topics Concern   Not on file  Social History Narrative   Not on file   Social Determinants of Health   Financial Resource Strain: Not on file  Food Insecurity: Not on file  Transportation Needs: Not on file  Physical Activity: Not on file  Stress: Not on file  Social Connections: Not on file   Additional Social History:    Allergies:  No Known Allergies  Labs:  Results for orders placed or performed during the hospital encounter of 01/21/22 (from the past 48 hour(s))  CBC with Differential     Status: Abnormal   Collection Time: 01/23/22  7:40 PM  Result Value Ref Range   WBC 15.4 (  H) 4.0 - 10.5 K/uL   RBC 4.87 4.22 - 5.81 MIL/uL   Hemoglobin 14.0 13.0 - 17.0 g/dL   HCT 61.4 43.1 - 54.0 %   MCV 87.9 80.0 - 100.0 fL   MCH 28.7 26.0 - 34.0 pg   MCHC 32.7 30.0 - 36.0 g/dL   RDW 08.6 76.1 - 95.0 %   Platelets 349 150 - 400 K/uL   nRBC 0.0 0.0 - 0.2 %   Neutrophils Relative % 65 %   Neutro Abs 10.0 (H) 1.7 - 7.7 K/uL   Lymphocytes Relative 25 %   Lymphs Abs 3.8 0.7 - 4.0 K/uL   Monocytes Relative 7 %   Monocytes Absolute 1.1 (H) 0.1 - 1.0 K/uL    Eosinophils Relative 2 %   Eosinophils Absolute 0.3 0.0 - 0.5 K/uL   Basophils Relative 1 %   Basophils Absolute 0.1 0.0 - 0.1 K/uL   Immature Granulocytes 0 %   Abs Immature Granulocytes 0.05 0.00 - 0.07 K/uL    Comment: Performed at [facility], [address] [place], [place] [address]  Resp panel by RT-PCR (RSV, Flu A&B, Covid) Anterior Nasal Swab     Status: None   Collection Time: 01/25/22  7:37 AM   Specimen: Anterior Nasal Swab  Result Value Ref Range   SARS Coronavirus 2 by RT PCR NEGATIVE NEGATIVE    Comment: (NOTE) SARS-CoV-2 target nucleic acids are NOT DETECTED.  The SARS-CoV-2 RNA is generally detectable in upper respiratory specimens during the acute phase of infection. The lowest concentration of SARS-CoV-2 viral copies this assay can detect is 138 copies/mL. A negative result does not preclude SARS-Cov-2 infection and should not be used as the sole basis for treatment or other patient management decisions. A negative result may occur with  improper specimen collection/handling, submission of specimen other than nasopharyngeal swab, presence of viral mutation(s) within the areas targeted by this assay, and inadequate number of viral copies(<138 copies/mL). A negative result must be combined with clinical observations, patient history, and epidemiological information. The expected result is Negative.  Fact Sheet for Patients:  BloggerCourse.com  Fact Sheet for Healthcare Providers:  SeriousBroker.it  This test is no t yet approved or cleared by the [place] FDA and  has been authorized for detection and/or diagnosis of SARS-CoV-2 by FDA under an Emergency Use Authorization (EUA). This EUA will remain  in effect (meaning this test can be used) for the duration of the COVID-19 declaration under Section 564(b)(1) of the Act, 21 U.S.C.section 360bbb-3(b)(1), unless the authorization is terminated  or revoked  sooner.       Influenza A by PCR NEGATIVE NEGATIVE   Influenza B by PCR NEGATIVE NEGATIVE    Comment: (NOTE) The Xpert Xpress SARS-CoV-2/FLU/RSV plus assay is intended as an aid in the diagnosis of influenza from Nasopharyngeal swab specimens and should not be used as a sole basis for treatment. Nasal washings and aspirates are unacceptable for Xpert Xpress SARS-CoV-2/FLU/RSV testing.  Fact Sheet for Patients: BloggerCourse.com  Fact Sheet for Healthcare Providers: SeriousBroker.it  This test is not yet approved or cleared by the [place] FDA and has been authorized for detection and/or diagnosis of SARS-CoV-2 by FDA under an Emergency Use Authorization (EUA). This EUA will remain in effect (meaning this test can be used) for the duration of the COVID-19 declaration under Section 564(b)(1) of the Act, 21 U.S.C. section 360bbb-3(b)(1), unless the authorization is terminated or revoked.     Resp Syncytial Virus by PCR  NEGATIVE NEGATIVE    Comment: (NOTE) Fact Sheet for Patients: BloggerCourse.comhttps://www.fda.gov/media/152166/download  Fact Sheet for Healthcare Providers: SeriousBroker.ithttps://www.fda.gov/media/152162/download  This test is not yet approved or cleared by the [place] FDA and has been authorized for detection and/or diagnosis of SARS-CoV-2 by FDA under an Emergency Use Authorization (EUA). This EUA will remain in effect (meaning this test can be used) for the duration of the COVID-19 declaration under Section 564(b)(1) of the Act, 21 U.S.C. section 360bbb-3(b)(1), unless the authorization is terminated or revoked.  Performed at [facility], [address] [place], [place] [address]     Medications:  Current Facility-Administered Medications  Medication Dose Route Frequency Provider Last Rate Last Admin   gabapentin (NEURONTIN) capsule 100 mg  100 mg Oral TID Leevy-Johnson, Harini Dearmond A, NP       [START ON 01/26/2022]  OLANZapine (ZYPREXA) tablet 10 mg  10 mg Oral Daily Leevy-Johnson, Karlos Scadden A, NP       OLANZapine zydis (ZYPREXA) disintegrating tablet 10 mg  10 mg Oral Q8H PRN Gloris Manchesterixon, Ryan, MD   10 mg at 01/24/22 1436   And   ziprasidone (GEODON) injection 20 mg  20 mg Intramuscular PRN Gloris Manchesterixon, Ryan, MD       No current outpatient medications on file.   Musculoskeletal: Strength & Muscle Tone: within normal limits Gait & Station: normal Patient leans: N/A  Psychiatric Specialty Exam:  Presentation  General Appearance:  Disheveled  Eye Contact: None  Speech: Clear and Coherent  Speech Volume: Normal  Handedness: Right  Mood and Affect  Mood: Irritable; Angry  Affect: Blunt; Restricted  Thought Process  Thought Processes: Other (comment) (refuses to participate in assessment)  Descriptions of Associations:Intact  Orientation:Other (comment) (pt refusing to participate)  Thought Content:Illogical  History of Schizophrenia/Schizoaffective disorder:No  Duration of Psychotic Symptoms:N/A  Hallucinations:Hallucinations: Other (comment) (pt refusing to fully participate in assessment)  Ideas of Reference:None  Suicidal Thoughts:Suicidal Thoughts: No  Homicidal Thoughts:Homicidal Thoughts: No  Sensorium  Memory: Other (comment) (pt refusing to fully participate in assessment)  Judgment: Fair (pt refusing to fully participate in assessment)  Insight: Poor (pt refusing to fully participate in assessment)  Executive Functions  Concentration: Fair (pt refusing to fully participate in assessment)  Attention Span: Fair (pt refusing to fully participate in assessment)  Recall: Other (comment) (pt refusing to fully participate in assessment)  Fund of Knowledge: Fair (pt refusing to fully participate in assessment)  Language: Fair (pt refusing to fully participate in assessment)  Psychomotor Activity  Psychomotor Activity: Psychomotor Activity: Normal  Assets   Assets: Communication Skills; Desire for Improvement; Housing; Physical Health; Resilience (pt refusing to fully participate in assessment)   Sleep  Sleep: Sleep: Good Number of Hours of Sleep: 10  Physical Exam: Physical Exam Vitals and nursing note reviewed.  Constitutional:      General: He is not in acute distress.    Appearance: He is normal weight. He is not ill-appearing.  HENT:     Head: Normocephalic.     Nose: Nose normal.     Mouth/Throat:     Mouth: Mucous membranes are moist.     Pharynx: Oropharynx is clear.  Eyes:     Pupils: Pupils are equal, round, and reactive to light.  Cardiovascular:     Rate and Rhythm: Normal rate.     Pulses: Normal pulses.  Pulmonary:     Effort: Pulmonary effort is normal.  Abdominal:     Palpations: Abdomen is soft.  Musculoskeletal:  General: Normal range of motion.     Cervical back: Normal range of motion.  Skin:    General: Skin is warm and dry.  Neurological:     Mental Status: He is alert. Mental status is at baseline.  Psychiatric:        Attention and Perception: Attention and perception normal.        Mood and Affect: Affect is blunt and inappropriate.        Speech: He is noncommunicative.        Behavior: Behavior is uncooperative and withdrawn.        Thought Content: Thought content is not paranoid or delusional. Thought content does not include homicidal or suicidal ideation. Thought content does not include homicidal or suicidal plan.    Review of Systems  Psychiatric/Behavioral:  Positive for substance abuse.   All other systems reviewed and are negative.  Blood pressure 130/89, pulse (!) 54, temperature 97.9 F (36.6 C), temperature source Oral, resp. rate 16, weight 83.4 kg, SpO2 100 %. Body mass index is 28.8 kg/m.  Treatment Plan Summary: Daily contact with patient to assess and evaluate symptoms and progress in treatment, Medication management, and Plan   PLAN:  Start:  Risperidone 2 mg  daily psychotic symptoms  Continue:  Gabapentin 100 mg TID   Discontinue:  Olanzapine 5 mg daily due to reported increase in auditory hallucinations.   PRN Medications:  Discontinue: Olanzapine ODT 10 mg q8 hrs PRN agitation; pt reports increased auditory hallucinations  Agitation Orders:  Risperidone 2 mg PO q8 hrs PRN agitation Ziprasidone IM 20 mg PRN agitation  Disposition: Recommend psychiatric Inpatient admission when medically cleared. Supportive therapy provided about ongoing stressors. Discussed crisis plan, support from social network, calling 911, coming to the Emergency Department, and calling Suicide Hotline.  This service was provided via telemedicine using a 2-way, interactive audio and video technology.  Names of all persons participating in this telemedicine service and their role in this encounter. Name: Maxie Barb Role: PMHNP  Name: Sarita Bottom Role: Attending MD  Name: Earnie Larsson Role: patient  Name:  Role:     Loletta Parish, NP 01/25/2022 11:07 AM

## 2022-01-25 NOTE — ED Provider Notes (Signed)
Emergency Medicine Observation Re-evaluation Note  Roger [name] is a 31 y.o. male, seen on rounds today.  Pt initially presented to the ED for complaints of Altered Mental Status and Psychiatric Evaluation Currently, the patient is sleeping  Physical Exam  BP 130/89 (BP Location: Right Arm)   Pulse (!) 54   Temp 97.9 F (36.6 C) (Oral)   Resp 16   Wt 83.4 kg   SpO2 100%   BMI 28.80 kg/m  Physical Exam General: NAD Cardiac: Regular HR/bradycardia Lungs: No respiratory distress Psych: Stable  ED Course / MDM  EKG:EKG Interpretation  Date/Time:  Tuesday January 21 2022 03:22:09 EST Ventricular Rate:  102 PR Interval:  136 QRS Duration: 92 QT Interval:  364 QTC Calculation: 475 R Axis:   83 Text Interpretation: Sinus tachycardia Borderline prolonged QT interval Baseline wander in lead(s) V5 V6 Confirmed by Geoffery Lyons ([id number]) on 01/21/2022 3:24:40 AM  I have reviewed the labs performed to date as well as medications administered while in observation.  Plan  Current plan is for inpatient psychiatry placement.  The patient remains under IVC.    Terald Sleeper, MD 01/25/22 [phone]

## 2022-01-25 NOTE — ED Notes (Signed)
Breakfast tray given. °

## 2022-01-25 NOTE — Progress Notes (Signed)
CSW spoke with admissions at [facility]. It was reported that this patient is under review.  Crissie Reese, MSW, Lenice Pressman Phone: [phone] Disposition/TOC

## 2022-01-25 NOTE — ED Notes (Signed)
Dinner tray given

## 2022-01-25 NOTE — ED Notes (Signed)
Patient ambulated to the bathroom without incident.

## 2022-01-25 NOTE — Progress Notes (Signed)
Per [facility] admissions, patient was declined, currently no available beds.   Roger [name], MSW, Lenice Pressman Phone: [phone] Disposition/TOC

## 2022-01-25 NOTE — Progress Notes (Signed)
[facility] admission requested a copy of patient's IVC paperwork for further review. Fax number#: [phone].  Crissie Reese, MSW, Lenice Pressman Phone: [phone] Disposition/TOC

## 2022-01-26 ENCOUNTER — Other Ambulatory Visit: Payer: Self-pay | Admitting: Psychiatry

## 2022-01-26 DIAGNOSIS — F1994 Other psychoactive substance use, unspecified with psychoactive substance-induced mood disorder: Secondary | ICD-10-CM

## 2022-01-26 MED ORDER — RISPERIDONE 1 MG PO TBDP
2.0000 mg | ORAL_TABLET | Freq: Every day | ORAL | Status: DC
  Administered 2022-01-27: 2 mg via ORAL
  Filled 2022-01-26 (×2): qty 2

## 2022-01-26 NOTE — ED Notes (Signed)
Pt received dinner tray.

## 2022-01-26 NOTE — ED Notes (Signed)
Pt told this sitter "I'm hearing voices again when I'm trying to sleep" nurse notified.

## 2022-01-26 NOTE — ED Provider Notes (Signed)
Emergency Medicine Observation Re-evaluation Note  Roger [name] is a 31 y.o. male, seen on rounds today.  Pt initially presented to the ED for complaints of Altered Mental Status and Psychiatric Evaluation Currently, the patient is sleeping.  Physical Exam  BP (!) 121/96 (BP Location: Right Arm)   Pulse (!) 104   Temp 98.1 F (36.7 C) (Oral)   Resp 20   Wt 83.4 kg   SpO2 99%   BMI 28.80 kg/m  Physical Exam General: Sleeping Cardiac: Extremities well-perfused Lungs: Breathing is unlabored Psych: Deferred  ED Course / MDM  EKG:EKG Interpretation  Date/Time:  Tuesday January 21 2022 03:22:09 EST Ventricular Rate:  102 PR Interval:  136 QRS Duration: 92 QT Interval:  364 QTC Calculation: 475 R Axis:   83 Text Interpretation: Sinus tachycardia Borderline prolonged QT interval Baseline wander in lead(s) V5 V6 Confirmed by Geoffery Lyons ([id number]) on 01/21/2022 3:24:40 AM  I have reviewed the labs performed to date as well as medications administered while in observation.  Recent changes in the last 24 hours include none.  Plan  Current plan is for inpatient psychiatric admission.    Gloris Manchester, MD 01/26/22 [phone]

## 2022-01-26 NOTE — ED Notes (Signed)
TTS at bedside. 

## 2022-01-26 NOTE — ED Notes (Signed)
Pt received lunch tray 

## 2022-01-26 NOTE — ED Notes (Signed)
D/T staffing issues, pt can't be transported to [facility] until the am, informed Greig Right, RN, she stated his bed would be held until tomorrow morning.

## 2022-01-26 NOTE — Progress Notes (Signed)
CSW followed-up [facility]. It was reported that there are currently no appropriate beds available.  Marke Goodwyn, MSW, LCSW-A, LCAS Phone: [phone] Disposition/TOC  

## 2022-01-26 NOTE — ED Notes (Signed)
Pt's states his hearing voices and become agitated, pt given prn Risperdal.

## 2022-01-26 NOTE — Consult Note (Addendum)
Telepsych Consultation   Reason for Consult:  TTS Referring Physician:  Sloan LeiterGray, Samuel A, DO Location of Patient: [facility] [phone] Location of Provider: Behavioral Health TTS Department  Patient Identification: Roger LarssonJoshua D [name] MRN:  [id number] Principal Diagnosis: Substance-induced psychotic disorder [facility](HCC) Diagnosis:  Principal Problem:   Substance-induced psychotic disorder (HCC) Active Problems:   Polysubstance abuse (HCC)   Total Time spent with patient: 45 minutes  Subjective:   Roger LarssonJoshua D [name] is a 31 y.o. male patient admitted with substance induced psychotic disorder.  Patient presents alert and oriented. Irritable initially; brightens on approach. Grandmother in the room visiting. Patient initially reports name as Roger HawkingDemetrius [name] then responds 'they call me Roger but my name is Roger SchoolJoshua Roger [name]'. Reports substance of choice "whatever". Describes hallucinations as several voices that tell him to go get high, and do different things. Denies any suicidal or homicidal ideations. Endorses ongoing auditory and visual hallucinations.   Reports interest in long term substance abuse treatment; mentions [facility] in [place].  HPI:  Roger LarssonJoshua D [name] is a [age] year old male patient with past psychiatric history of polysubstance abuse including IVDU and substance induced psychotic disorder who presented to [facility] via EMS after being found unresponsive in someone's backyard with bizarre behavior. Upon arrival patient was noted to be agitated and not following commands requiring medication and restraints. Once on the unit patient was found with illegal substances in his rectum requiring law enforcement notification. UDS remains outstanding. BAL<10.    Past Psychiatric History:  polysubstance abuse, IVDU, substance induced psychotic disorder    Risk to Self:  Risk to Others:  Prior Inpatient Therapy:  Prior Outpatient Therapy:   Past Medical History:  Past Medical History:   Diagnosis Date   MRSA (methicillin resistant Staphylococcus aureus)    Scoliosis    History reviewed. No pertinent surgical history. Family History:  Family History  Problem Relation Age of Onset   Diabetes Mother    Family Psychiatric  History: not noted Social History:  Social History   Substance and Sexual Activity  Alcohol Use No     Social History   Substance and Sexual Activity  Drug Use No    Social History   Socioeconomic History   Marital status: Single    Spouse name: Not on file   Number of children: Not on file   Years of education: Not on file   Highest education level: Not on file  Occupational History   Not on file  Tobacco Use   Smoking status: Every Day    Packs/day: 1.00    Years: 13.00    Total pack years: 13.00    Types: Cigarettes   Smokeless tobacco: Never  Vaping Use   Vaping Use: Never used  Substance and Sexual Activity   Alcohol use: No   Drug use: No   Sexual activity: Yes    Birth control/protection: None  Other Topics Concern   Not on file  Social History Narrative   Not on file   Social Determinants of Health   Financial Resource Strain: Not on file  Food Insecurity: Not on file  Transportation Needs: Not on file  Physical Activity: Not on file  Stress: Not on file  Social Connections: Not on file   Additional Social History:    Allergies:  No Known Allergies  Labs:  Results for orders placed or performed during the hospital encounter of 01/21/22 (from the past 48 hour(s))  Resp panel by RT-PCR (RSV, Flu A&B,  Covid) Anterior Nasal Swab     Status: None   Collection Time: 01/25/22  7:37 AM   Specimen: Anterior Nasal Swab  Result Value Ref Range   SARS Coronavirus 2 by RT PCR NEGATIVE NEGATIVE    Comment: (NOTE) SARS-CoV-2 target nucleic acids are NOT DETECTED.  The SARS-CoV-2 RNA is generally detectable in upper respiratory specimens during the acute phase of infection. The lowest concentration of SARS-CoV-2  viral copies this assay can detect is 138 copies/mL. A negative result does not preclude SARS-Cov-2 infection and should not be used as the sole basis for treatment or other patient management decisions. A negative result may occur with  improper specimen collection/handling, submission of specimen other than nasopharyngeal swab, presence of viral mutation(s) within the areas targeted by this assay, and inadequate number of viral copies(<138 copies/mL). A negative result must be combined with clinical observations, patient history, and epidemiological information. The expected result is Negative.  Fact Sheet for Patients:  BloggerCourse.com  Fact Sheet for Healthcare Providers:  SeriousBroker.it  This test is no t yet approved or cleared by the [place] FDA and  has been authorized for detection and/or diagnosis of SARS-CoV-2 by FDA under an Emergency Use Authorization (EUA). This EUA will remain  in effect (meaning this test can be used) for the duration of the COVID-19 declaration under Section 564(b)(1) of the Act, 21 U.S.C.section 360bbb-3(b)(1), unless the authorization is terminated  or revoked sooner.       Influenza A by PCR NEGATIVE NEGATIVE   Influenza B by PCR NEGATIVE NEGATIVE    Comment: (NOTE) The Xpert Xpress SARS-CoV-2/FLU/RSV plus assay is intended as an aid in the diagnosis of influenza from Nasopharyngeal swab specimens and should not be used as a sole basis for treatment. Nasal washings and aspirates are unacceptable for Xpert Xpress SARS-CoV-2/FLU/RSV testing.  Fact Sheet for Patients: BloggerCourse.com  Fact Sheet for Healthcare Providers: SeriousBroker.it  This test is not yet approved or cleared by the [place] FDA and has been authorized for detection and/or diagnosis of SARS-CoV-2 by FDA under an Emergency Use Authorization (EUA). This EUA  will remain in effect (meaning this test can be used) for the duration of the COVID-19 declaration under Section 564(b)(1) of the Act, 21 U.S.C. section 360bbb-3(b)(1), unless the authorization is terminated or revoked.     Resp Syncytial Virus by PCR NEGATIVE NEGATIVE    Comment: (NOTE) Fact Sheet for Patients: BloggerCourse.com  Fact Sheet for Healthcare Providers: SeriousBroker.it  This test is not yet approved or cleared by the [place] FDA and has been authorized for detection and/or diagnosis of SARS-CoV-2 by FDA under an Emergency Use Authorization (EUA). This EUA will remain in effect (meaning this test can be used) for the duration of the COVID-19 declaration under Section 564(b)(1) of the Act, 21 U.S.C. section 360bbb-3(b)(1), unless the authorization is terminated or revoked.  Performed at [facility], [address], [place], [place] [address]   Urinalysis, Routine w reflex microscopic Urine, Clean Catch     Status: None   Collection Time: 01/25/22  5:29 PM  Result Value Ref Range   Color, Urine YELLOW YELLOW   APPearance CLEAR CLEAR   Specific Gravity, Urine 1.017 1.005 - 1.030   pH 6.0 5.0 - 8.0   Glucose, UA NEGATIVE NEGATIVE mg/dL   Hgb urine dipstick NEGATIVE NEGATIVE   Bilirubin Urine NEGATIVE NEGATIVE   Ketones, ur NEGATIVE NEGATIVE mg/dL   Protein, ur NEGATIVE NEGATIVE mg/dL   Nitrite NEGATIVE NEGATIVE  Leukocytes,Ua NEGATIVE NEGATIVE    Comment: Performed at [facility], [address] [place], [place] [address]  Urine rapid drug screen (hosp performed)     Status: Abnormal   Collection Time: 01/25/22  5:29 PM  Result Value Ref Range   Opiates NONE DETECTED NONE DETECTED   Cocaine NONE DETECTED NONE DETECTED   Benzodiazepines NONE DETECTED NONE DETECTED   Amphetamines NONE DETECTED NONE DETECTED   Tetrahydrocannabinol POSITIVE (A) NONE DETECTED   Barbiturates NONE DETECTED NONE DETECTED     Comment: (NOTE) DRUG SCREEN FOR MEDICAL PURPOSES ONLY.  IF CONFIRMATION IS NEEDED FOR ANY PURPOSE, NOTIFY LAB WITHIN 5 DAYS.  LOWEST DETECTABLE LIMITS FOR URINE DRUG SCREEN Drug Class                     Cutoff (ng/mL) Amphetamine and metabolites    1000 Barbiturate and metabolites    200 Benzodiazepine                 200 Opiates and metabolites        300 Cocaine and metabolites        300 THC                            50 Performed at [facility], [address] [place], [place] [address]     Medications:  Current Facility-Administered Medications  Medication Dose Route Frequency Provider Last Rate Last Admin   gabapentin (NEURONTIN) capsule 100 mg  100 mg Oral TID Leevy-Johnson, Adrieana Fennelly A, NP   100 mg at 01/26/22 1607   risperiDONE (RISPERDAL M-TABS) disintegrating tablet 2 mg  2 mg Oral Q8H PRN Leevy-Johnson, Elric Tirado A, NP   2 mg at 01/26/22 1016   And   ziprasidone (GEODON) injection 20 mg  20 mg Intramuscular PRN Leevy-Johnson, Hila Bolding A, NP       risperiDONE (RISPERDAL M-TABS) disintegrating tablet 2 mg  2 mg Oral Daily Leevy-Johnson, Shalane Florendo A, NP       No current outpatient medications on file.    Musculoskeletal: Strength & Muscle Tone: within normal limits Gait & Station: normal Patient leans: N/A  Psychiatric Specialty Exam:  Presentation  General Appearance:  Disheveled  Eye Contact: None  Speech: Clear and Coherent  Speech Volume: Normal  Handedness: Right  Mood and Affect  Mood: Dysphoric  Affect: Blunt; Labile  Thought Process  Thought Processes: Goal Directed  Descriptions of Associations:Intact  Orientation:Partial  Thought Content:Logical  History of Schizophrenia/Schizoaffective disorder:No  Duration of Psychotic Symptoms:N/A  Hallucinations:Hallucinations: None  Ideas of Reference:None  Suicidal Thoughts:Suicidal Thoughts: No  Homicidal Thoughts:Homicidal Thoughts: No  Sensorium  Memory: Immediate Fair; Recent  Fair  Judgment: Poor  Insight: Poor  Executive Functions  Concentration: Fair  Attention Span: Fair  Recall: Fair  Fund of Knowledge: Fair  Language: Fair   Psychomotor Activity  Psychomotor Activity: Psychomotor Activity: Normal  Assets  Assets: Resilience; Physical Health  Sleep  Sleep: Sleep: Good  Physical Exam: Physical Exam Vitals and nursing note reviewed.  HENT:     Head: Normocephalic.     Nose: Nose normal.     Mouth/Throat:     Pharynx: Oropharynx is clear.  Eyes:     Pupils: Pupils are equal, round, and reactive to light.  Cardiovascular:     Rate and Rhythm: Normal rate.     Pulses: Normal pulses.  Pulmonary:     Effort: Pulmonary effort is normal.  Abdominal:     Palpations: Abdomen is soft.  Musculoskeletal:        General: Normal range of motion.     Cervical back: Normal range of motion.  Skin:    General: Skin is dry.  Neurological:     Mental Status: He is alert and oriented to person, place, and time.  Psychiatric:        Attention and Perception: He perceives auditory hallucinations.        Mood and Affect: Affect is blunt.        Speech: Speech normal.        Behavior: Behavior is cooperative.        Thought Content: Thought content does not include homicidal or suicidal ideation. Thought content does not include homicidal or suicidal plan.        Cognition and Memory: Cognition and memory normal.        Judgment: Judgment is impulsive.    Review of Systems  Psychiatric/Behavioral:  Positive for hallucinations and substance abuse.   All other systems reviewed and are negative.  Blood pressure (!) 108/52, pulse 82, temperature 98 F (36.7 C), temperature source Oral, resp. rate 16, weight 83.4 kg, SpO2 98 %. Body mass index is 28.8 kg/m.  Treatment Plan Summary: Daily contact with patient to assess and evaluate symptoms and progress in treatment, Medication management, and Plan   PLAN:  Start:  Risperidone 2 mg  daily psychotic symptoms   Continue:  Gabapentin 100 mg TID    Discontinue:  Olanzapine 5 mg daily due to reported increase in auditory hallucinations.    PRN Medications:  Discontinue: Olanzapine ODT 10 mg q8 hrs PRN agitation; pt reports increased auditory hallucinations   Agitation Orders:  Risperidone 2 mg PO q8 hrs PRN agitation Ziprasidone IM 20 mg PRN agitation  Disposition: Supportive therapy provided about ongoing stressors. Discussed crisis plan, support from social network, calling 911, coming to the Emergency Department, and calling Suicide Hotline.  This service was provided via telemedicine using a 2-way, interactive audio and video technology.  Names of all persons participating in this telemedicine service and their role in this encounter. Name: Maxie Barb Role: PMHNP  Name: Sarita Bottom Role: Attending MD  Name: Roger [name] Role: patient  Name:  Role:     Loletta Parish, NP 01/26/2022 5:05 PM

## 2022-01-26 NOTE — ED Notes (Signed)
Pt received breakfast tray 

## 2022-01-27 MED ORDER — OLANZAPINE 5 MG PO TBDP
5.0000 mg | ORAL_TABLET | Freq: Once | ORAL | Status: AC
  Administered 2022-01-27: 5 mg via ORAL
  Filled 2022-01-27: qty 1

## 2022-01-27 NOTE — ED Notes (Signed)
Pt took a shower and given lunch tray.

## 2022-01-27 NOTE — Progress Notes (Signed)
This CSW requested that pt be reviewed by [facility] Rosey Bath, RN at 5:18pm. At 5:29pm Rosey Bath, RN informed this CSW that pt was accepted to [facility] yesterday but did not transfer due to transport.  At 9:11pm this CSW added Binnie Rail, RN to inquire about bed assignment at [facility] CSW is awaiting on follow up from [facility] on accepting information. CSW will assist and follow with placement.   Maryjean Ka, MSW, LCSWA 01/27/2022 9:15 PM

## 2022-01-27 NOTE — ED Notes (Signed)
Pt given breakfast tray

## 2022-01-27 NOTE — ED Notes (Addendum)
Pt given dinner tray.

## 2022-01-27 NOTE — ED Notes (Signed)
Grandmother called for update on transfer. Grandmother wished pt a [place] Christmas and stated she should arrive around 12 noon to visit with patient

## 2022-01-27 NOTE — ED Notes (Addendum)
Notified by sitter that pt was requesting medication to "stop the voices in his head". Dr. Criss Alvine notified and new orders given.

## 2022-01-27 NOTE — ED Provider Notes (Signed)
Emergency Medicine Observation Re-evaluation Note  Roger [name] is a 31 y.o. male, seen on rounds today.  Pt initially presented to the ED for complaints of Altered Mental Status and Psychiatric Evaluation Currently, the patient is sleeping.  Physical Exam  BP (!) 141/94 (BP Location: Right Arm)   Pulse (!) 101   Temp 98.5 F (36.9 C) (Oral)   Resp 16   Wt 83.4 kg   SpO2 97%   BMI 28.80 kg/m  Physical Exam General: No acute distress Lungs: Normal effort   ED Course / MDM  EKG:EKG Interpretation  Date/Time:  Tuesday January 21 2022 03:22:09 EST Ventricular Rate:  102 PR Interval:  136 QRS Duration: 92 QT Interval:  364 QTC Calculation: 475 R Axis:   83 Text Interpretation: Sinus tachycardia Borderline prolonged QT interval Baseline wander in lead(s) V5 V6 Confirmed by Geoffery Lyons ([id number]) on 01/21/2022 3:24:40 AM  I have reviewed the labs performed to date as well as medications administered while in observation.  Recent changes in the last 24 hours include persistent hallucinations and intermittent agitation.  Oral meds have been provided.  Plan  Current plan is for inpatient psychiatric treatment.  Plan for transfer today.    Linwood Dibbles, MD 01/27/22 [phone]

## 2022-01-28 ENCOUNTER — Other Ambulatory Visit: Payer: Self-pay

## 2022-01-28 ENCOUNTER — Encounter (HOSPITAL_COMMUNITY): Payer: Self-pay | Admitting: Psychiatry

## 2022-01-28 ENCOUNTER — Inpatient Hospital Stay (HOSPITAL_COMMUNITY)
Admission: AD | Admit: 2022-01-28 | Discharge: 2022-02-04 | DRG: 885 | Disposition: A | Discharge status: Other Acute Inpatient Hospital | Payer: 59 | Attending: Psychiatry | Admitting: Psychiatry

## 2022-01-28 DIAGNOSIS — Z8614 Personal history of Methicillin resistant Staphylococcus aureus infection: Secondary | ICD-10-CM

## 2022-01-28 DIAGNOSIS — Z560 Unemployment, unspecified: Secondary | ICD-10-CM

## 2022-01-28 DIAGNOSIS — F1910 Other psychoactive substance abuse, uncomplicated: Secondary | ICD-10-CM | POA: Diagnosis not present

## 2022-01-28 DIAGNOSIS — F1420 Cocaine dependence, uncomplicated: Secondary | ICD-10-CM | POA: Diagnosis not present

## 2022-01-28 DIAGNOSIS — Z79899 Other long term (current) drug therapy: Secondary | ICD-10-CM

## 2022-01-28 DIAGNOSIS — G4700 Insomnia, unspecified: Secondary | ICD-10-CM | POA: Diagnosis not present

## 2022-01-28 DIAGNOSIS — F411 Generalized anxiety disorder: Secondary | ICD-10-CM | POA: Diagnosis not present

## 2022-01-28 DIAGNOSIS — F1090 Alcohol use, unspecified, uncomplicated: Secondary | ICD-10-CM | POA: Diagnosis present

## 2022-01-28 DIAGNOSIS — F1999 Other psychoactive substance use, unspecified with unspecified psychoactive substance-induced disorder: Secondary | ICD-10-CM | POA: Diagnosis present

## 2022-01-28 DIAGNOSIS — Z5900 Homelessness unspecified: Secondary | ICD-10-CM | POA: Diagnosis not present

## 2022-01-28 DIAGNOSIS — Z716 Tobacco abuse counseling: Secondary | ICD-10-CM

## 2022-01-28 DIAGNOSIS — R69 Illness, unspecified: Secondary | ICD-10-CM | POA: Diagnosis not present

## 2022-01-28 DIAGNOSIS — F1520 Other stimulant dependence, uncomplicated: Secondary | ICD-10-CM | POA: Diagnosis not present

## 2022-01-28 DIAGNOSIS — F4310 Post-traumatic stress disorder, unspecified: Secondary | ICD-10-CM | POA: Diagnosis not present

## 2022-01-28 DIAGNOSIS — F17210 Nicotine dependence, cigarettes, uncomplicated: Secondary | ICD-10-CM | POA: Diagnosis not present

## 2022-01-28 DIAGNOSIS — F1523 Other stimulant dependence with withdrawal: Secondary | ICD-10-CM | POA: Diagnosis not present

## 2022-01-28 DIAGNOSIS — F333 Major depressive disorder, recurrent, severe with psychotic symptoms: Secondary | ICD-10-CM | POA: Diagnosis not present

## 2022-01-28 DIAGNOSIS — Z23 Encounter for immunization: Secondary | ICD-10-CM

## 2022-01-28 DIAGNOSIS — F17200 Nicotine dependence, unspecified, uncomplicated: Secondary | ICD-10-CM | POA: Diagnosis not present

## 2022-01-28 DIAGNOSIS — F419 Anxiety disorder, unspecified: Secondary | ICD-10-CM | POA: Diagnosis not present

## 2022-01-28 MED ORDER — HYDROXYZINE HCL 25 MG PO TABS
25.0000 mg | ORAL_TABLET | Freq: Three times a day (TID) | ORAL | Status: DC | PRN
  Administered 2022-01-28 – 2022-02-03 (×4): 25 mg via ORAL
  Filled 2022-01-28 (×5): qty 1

## 2022-01-28 MED ORDER — LORAZEPAM 1 MG PO TABS: 1.0000 mg | ORAL_TABLET | ORAL | Status: DC | PRN

## 2022-01-28 MED ORDER — ACETAMINOPHEN 325 MG PO TABS
650.0000 mg | ORAL_TABLET | Freq: Four times a day (QID) | ORAL | Status: DC | PRN
  Administered 2022-01-28 – 2022-02-02 (×8): 650 mg via ORAL
  Filled 2022-01-28 (×8): qty 2

## 2022-01-28 MED ORDER — NICOTINE 14 MG/24HR TD PT24
14.0000 mg | MEDICATED_PATCH | Freq: Every day | TRANSDERMAL | Status: DC
  Administered 2022-01-28 – 2022-01-29 (×2): 14 mg via TRANSDERMAL
  Filled 2022-01-28 (×10): qty 1

## 2022-01-28 MED ORDER — MAGNESIUM HYDROXIDE 400 MG/5ML PO SUSP: 30.0000 mL | Freq: Every day | ORAL | Status: DC | PRN

## 2022-01-28 MED ORDER — RISPERIDONE 2 MG PO TBDP
2.0000 mg | ORAL_TABLET | Freq: Every day | ORAL | Status: DC
  Administered 2022-01-28 – 2022-01-29 (×2): 2 mg via ORAL
  Filled 2022-01-28 (×4): qty 1

## 2022-01-28 MED ORDER — ZIPRASIDONE MESYLATE 20 MG IM SOLR: 20.0000 mg | INTRAMUSCULAR | Status: DC | PRN

## 2022-01-28 MED ORDER — RISPERIDONE 2 MG PO TBDP
2.0000 mg | ORAL_TABLET | Freq: Three times a day (TID) | ORAL | Status: DC | PRN
  Filled 2022-01-28: qty 1

## 2022-01-28 MED ORDER — ALUM & MAG HYDROXIDE-SIMETH 200-200-20 MG/5ML PO SUSP: 30.0000 mL | ORAL | Status: DC | PRN

## 2022-01-28 MED ORDER — PNEUMOCOCCAL 20-VAL CONJ VACC 0.5 ML IM SUSY
0.5000 mL | PREFILLED_SYRINGE | INTRAMUSCULAR | Status: AC
  Administered 2022-01-29: 0.5 mL via INTRAMUSCULAR
  Filled 2022-01-28: qty 0.5

## 2022-01-28 NOTE — Progress Notes (Signed)
Pt called his aunt to let her know he was okay.

## 2022-01-28 NOTE — [facility] Suicide Risk Assessment (Signed)
[facility] INPATIENT:  Family/Significant Other Suicide Prevention Education  Suicide Prevention Education:  Education Completed; Roger [name], aunt, [phone] has been identified by the patient as the family member/significant other with whom the patient will be residing, and identified as the person(s) who will aid the patient in the event of a mental health crisis (suicidal ideations/suicide attempt).  With written consent from the patient, the family member/significant other has been provided the following suicide prevention education, prior to the and/or following the discharge of the patient.   CSW spoke with patient aunt and completed patient safety planning. Aunt states that patient main issue is substance use, " he had an IV stuck in his arm and that's why he was at [facility], anything he can get his hand on he shoots up badly. He done OD 3-4 times and if does not seek Long-term treatment he will likely die, my daughter died from drug use, it's terrible". Aunt continues to say that patient is homeless and chooses to be homeless because he has been running from his [facility] for about 3-4 months and [employer] fast streamer knows where aunt lives and comes out often to visit. Aunt asked for CSW not to mention it to patient, but did say that if patient is serious and goes to Long-term treatment his probation officer will allow for him to go and do treatment but probation officer will get him afterwards; " regardless he will have to do the 8 months for violating his parole. He been in prison twice and comes back out to do the same thing". However, aunt confirms that patient does not have any guns or weapons to his name. Aunt had no further questions or concerns at this time.   The suicide prevention education provided includes the following: Suicide risk factors Suicide prevention and interventions National Suicide Hotline telephone number [facility] assessment  telephone number [facility] Emergency Assistance 911 [facility] and/or Residential Mobile Crisis Unit telephone number  Request made of family/significant other to: Remove weapons (e.g., guns, rifles, knives), all items previously/currently identified as safety concern.   Remove drugs/medications (over-the-counter, prescriptions, illicit drugs), all items previously/currently identified as a safety concern.  The family member/significant other verbalizes understanding of the suicide prevention education information provided.  The family member/significant other agrees to remove the items of safety concern listed above.  Roger [name] 01/28/2022, 3:36 PM

## 2022-01-28 NOTE — Tx Team (Signed)
Initial Treatment Plan 01/28/2022 4:21 AM Roger [name] PXT:[id number]    PATIENT STRESSORS: Substance abuse     PATIENT STRENGTHS: Average or above average intelligence  Motivation for treatment/growth  Supportive family/friends    PATIENT IDENTIFIED PROBLEMS: Substance induced mood disorder  Methamphetamine use d/o  Marijuana use d/o  Tobacco use d/o  Anxiety  Homelessness  ("Pt wants to get his mind right and get off of drugs and be a better father.")         DISCHARGE CRITERIA:  Ability to meet basic life and health needs Adequate post-discharge living arrangements Improved stabilization in mood, thinking, and/or behavior Verbal commitment to aftercare and medication compliance  PRELIMINARY DISCHARGE PLAN: Outpatient therapy Placement in alternative living arrangements  PATIENT/FAMILY INVOLVEMENT: This treatment plan has been presented to and reviewed with the patient, Roger [name], and/or family member.  The patient and family have been given the opportunity to ask questions and make suggestions.  Victorino December, RN 01/28/2022, 4:21 AM

## 2022-01-28 NOTE — Progress Notes (Signed)
Pt presents with calm affect. Pt remains medication compliant. Pt endorses auditory and visual hallucinations. Pt states auditory hallucinations are non commanding, just voices saying to get high and states he sees evil cartoon characters. Pt denies suicidal and homicidal thoughts. Support and encouragement offered, Q 15 minute checks ongoing for safety.
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
## 2022-01-28 NOTE — Progress Notes (Addendum)
Patient ID: Roger [name], male   DOB: 04/12/1990, 31 y.o.   MRN: [id number]  D: Pt here IVC from [facility]. Pt goes by "JD." Pt denies SI/HI at this time. Pt endorses AH of hearing voices and chatter. Pt also endorses sometimes seeing people and cartoon characters that aren't there. Pt c/o pain 6/10 as an acute headache. Pt is homeless and stays with various people and moves around a lot. Pt states he has at least 6 children. He says he wants to work on getting his mind right so he can be a better father and a better person. Explained IVC process to patient. Pt is currently unemployed, denies access to weapons.   Pt says he has lost a lot d/t his addictions to methamphetamine and marijuana. Last use for both drugs was last week. Pt states he uses "every chance I get." Pt stated that he has used meth for a long time. Pt also drinks about twice a week and consumes almost a fifth of liquor. Pt states that last drink was a week ago, before entering the hospital. Pt denies withdrawing from meth before and vaguely remembers ever withdrawing from alcohol. Pt says he hears the voices and sees things more when he is sober. "They don't bother me when I'm high." No withdrawal symptoms of either present at this time. Pt also smokes 2 packs of cigarettes daily.  Pt counts his grandmother and his aunt as social supports. He stays with his aunt from time to time. "I don't want to outstay my welcome or press my addiction on anyone. She doesn't drink or do drugs. I want to be respectful like I would want someone to be to me." Pt says he has been without a home for a while.   A: Pt was offered support and encouragement. Pt is cooperative during assessment. VS assessed and admission paperwork signed. Belongings searched and contraband items placed in locker. Non-invasive skin search completed: pt has tattoos on his right ear, bilateral forearms, chest and right leg and ankle. Pt has bruising to right upper forearm from blood work.  Pt also has a red spot on his right forearm where he says a needle broke off in his arm and is still there. 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 Pt offered food and drink and drink accepted. Pt introduced to unit milieu by nursing staff. Q 15 minute checks were started for safety.   R: Pt in room. Pt safety maintained on unit.

## 2022-01-28 NOTE — Progress Notes (Signed)
   01/28/22 0601  15 Minute Checks  Location Bedroom  Visual Appearance Calm  Behavior Sleeping  Sleep (Behavioral Health Patients Only)  Calculate sleep? (Click Yes once per 24 hr at 0600 safety check) Yes  Documented sleep last 24 hours 1.5

## 2022-01-28 NOTE — H&P (Cosign Needed Addendum)
Psychiatric Admission Assessment Adult  Patient Identification: Roger [name] MRN:  [id number] Date of Evaluation:  01/28/2022 Chief Complaint:  Substance-induced disorder [facility]) [F19.99] Principal Diagnosis: Substance-induced disorder (HCC) Diagnosis:  Principal Problem:   Substance-induced disorder (HCC)  History of Present Illness: Roger [name] is a 31 year old male patient with past psychiatric history of polysubstance abuse including IVDU and substance induced psychotic disorder who presented to [facility] 01/21/22 via EMS after being found unresponsive in someone's backyard with shirtless and unresponsive; owner called EMS who then gave pt Narcan after not responding then was noted to become agitated and combative requiring medications and physical restraint. Once in the unit ED patient was found with illegal substances in his rectum requiring law enforcement notification. UDS remains outstanding. BAL<10. Patient was transferred to [facility] 01/27/22 for further stabilization and treatment.   24 hour assessment: Patient arrived on the unit during early morning hours where he continues to endorse auditory hallucinations. Reported to staff as being homeless and 'wanting to get my mind right and get off drugs and be a better father'. Reports having 6 children. He has remained visible within the milieu and engaged in group and unit activities. Vital signs remain within normal limits. Medication compliant. No PRN medications given since admission. Agitation protocol in place.   Assessment: Patient observed in dayroom interacting with peers and staff. Comes to room without any issues for completion of assessment. He reports his name as '[name], birthday 11/28/1990'. He presents alert and oriented. Reports reason for admission was to 'seek betterment of my addiction'. Describes his mood as 'feeling positive'. Reports ongoing auditory hallucinations that he describes as 'voices laughing. Telling me  to get high and that I'm dumb for wanting to get help'. States voices have been ongoing for the past year and appear as different voices but are 'distinctive chatter'. Denies any commands. He endorses ongoing paranoia that he can 'hear people in different rooms talking about me'. Reports depression as 3/10, anxiety 7/10. He denies any thoughts of wanting to harm himself or anyone else, visual hallucinations. Denies any active cravings. He identifies his grandmother and aunt as active supports. Children as motivating factors. He continues to voice an interest in residential substance abuse treatment upon discharge. Contracts for safety at this time.   Associated Signs/Symptoms: Depression Symptoms:  difficulty concentrating, hopelessness, (Hypo) Manic Symptoms:  Hallucinations, Labiality of Mood, Anxiety Symptoms:   none noted Psychotic Symptoms:  Hallucinations: Auditory PTSD Symptoms: NA Total Time spent with patient: 45 minutes  Past Psychiatric History: polysubstance abuse, active intravenous drug use, substance induced psychotic disorder, substance induced disorder  Is the patient at risk to self? No.  Has the patient been a risk to self in the past 6 months? Yes.    Has the patient been a risk to self within the distant past? Yes.    Is the patient a risk to others? No.  Has the patient been a risk to others in the past 6 months? Yes.    Has the patient been a risk to others within the distant past? Yes.     [place] Scale:  Flowsheet Row [facility] (Current) [facility] from 07/18/2021 in [facility] EMERGENCY DEPARTMENT ED from 10/15/2020 in [facility] EMERGENCY DEPARTMENT  C-SSRS RISK CATEGORY No Risk No Risk No Risk        Prior Inpatient Therapy: No. If yes, describe: none noted in chart  Prior Outpatient Therapy: No. If yes, describe. Has refused outpatient treatment   Substance Abuse Hx: Alcohol:  Occasional but regular. 'I use whatever I can get my hands on'. Tobacco: Nicotine Illicit drugs: polysubstance use history including IVDU. Reports drug of choice as methamphetamines.  Rx drug abuse: when able to get   Rehab hx: Denies   Alcohol Screening: 1. How often do you have a drink containing alcohol?: 2 to 3 times a week 2. How many drinks containing alcohol do you have on a typical day when you are drinking?: 10 or more 3. How often do you have six or more drinks on one occasion?: Weekly AUDIT-C Score: 10 4. How often during the last year have you found that you were not able to stop drinking once you had started?: Never 5. How often during the last year have you failed to do what was normally expected from you because of drinking?: Monthly 6. How often during the last year have you needed a first drink in the morning to get yourself going after a heavy drinking session?: Monthly 7. How often during the last year have you had a feeling of guilt of remorse after drinking?: Monthly 8. How often during the last year have you been unable to remember what happened the night before because you had been drinking?: Less than monthly 9. Have you or someone else been injured as a result of your drinking?: No 10. Has a relative or friend or a doctor or another health worker been concerned about your drinking or suggested you cut down?: Yes, during the last year Alcohol Use Disorder Identification Test Final Score (AUDIT): 21 Alcohol Brief Interventions/Follow-up: Alcohol education/Brief advice Substance Abuse History in the last 12 months:  Yes.   Consequences of Substance Abuse: Medical Consequences:  'multiple' overdoses (unable to quantify) Blackouts:  'multiple' (unable to quantify) Previous Psychotropic Medications: Yes  Psychological Evaluations: Yes  Past Medical History:  Past Medical History:  Diagnosis Date   MRSA (methicillin resistant Staphylococcus aureus)    Scoliosis     History reviewed. No pertinent surgical history. Family History:  Family History  Problem Relation Age of Onset   Diabetes Mother   Past Medical History: Medical Diagnoses: MRSA in the past Home Rx: Denies  Prior Hosp: Denies  Prior Surgeries/Trauma:Denies  Head trauma, LOC, concussions, seizures: reports requiring Narcan several times; unable to recall exact number Allergies: NKA PCP: None   Family History: Medical: Diabetes; mother  Psych: Denies  Psych Rx: Denies  SA/HA:  Denies  Substance use family hx: Denies    Social History: Pt reports highest level of education as being some high school. Has 6 children; 1 girl, 5 boys. Currently homeless.   Marital Status:Single Sexual orientation: heterosexual Children: 6 Employment: unemployed Peer Group: none  Housing:homeless Finances:none Legal: hx of drug related charges Hotel manager: no  Family Psychiatric  History: not noted Tobacco Screening:  Social History   Tobacco Use  Smoking Status Every Day   Packs/day: 2.00   Years: 13.00   Total pack years: 26.00   Types: Cigarettes  Smokeless Tobacco Never    BH Tobacco Counseling     Are you interested in Tobacco Cessation Medications?  Yes, implement Nicotene Replacement Protocol Counseled patient on smoking cessation:  Refused/Declined practical counseling Reason Tobacco Screening Not Completed: No value filed.       Social History:  Social History   Substance and Sexual Activity  Alcohol Use Yes   Comment: last drink was a  week ago - drinks about a fifth of liquor 1-2 times a week     Social History   Substance and Sexual Activity  Drug Use Yes   Types: Methamphetamines, Marijuana   Comment: last use for both was last week    Additional Social History: Marital status: Single Are you sexually active?: Yes What is your sexual orientation?: "I'm straight." Has your sexual activity been affected by drugs, alcohol, medication, or emotional stress?:  "No" Does patient have children?: Yes How many children?: 6 How is patient's relationship with their children?: "I don't get to see them like that. I have my oldest daughter and 5 boys."   Allergies:  No Known Allergies Lab Results: No results found for this or any previous visit (from the past 48 hour(s)).  Blood Alcohol level:  Lab Results  Component Value Date   ETH <10 01/21/2022   ETH <10 07/18/2021    Metabolic Disorder Labs:  No results found for: "HGBA1C", "MPG" No results found for: "PROLACTIN" No results found for: "CHOL", "TRIG", "HDL", "CHOLHDL", "VLDL", "LDLCALC"  Current Medications: Current Facility-Administered Medications  Medication Dose Route Frequency Provider Last Rate Last Admin   acetaminophen (TYLENOL) tablet 650 mg  650 mg Oral Q6H PRN Leevy-Johnson, Bretta Fees A, NP   650 mg at 01/28/22 0742   alum & mag hydroxide-simeth (MAALOX/MYLANTA) 200-200-20 MG/5ML suspension 30 mL  30 mL Oral Q4H PRN Leevy-Johnson, Tyanne Derocher A, NP       magnesium hydroxide (MILK OF MAGNESIA) suspension 30 mL  30 mL Oral Daily PRN Leevy-Johnson, Akito Boomhower A, NP       nicotine (NICODERM CQ - dosed in mg/24 hours) patch 14 mg  14 mg Transdermal Daily Onuoha, Chinwendu V, NP   14 mg at 01/28/22 0739   [START ON 01/29/2022] pneumococcal 20-valent conjugate vaccine (PREVNAR 20) injection 0.5 mL  0.5 mL Intramuscular Tomorrow-1000 Onuoha, Chinwendu V, NP       risperiDONE (RISPERDAL M-TABS) disintegrating tablet 2 mg  2 mg Oral Daily Leevy-Johnson, Rheanna Sergent A, NP   2 mg at 01/28/22 0740   PTA Medications: No medications prior to admission.    Musculoskeletal: Strength & Muscle Tone: within normal limits Gait & Station: normal Patient leans: N/A  Psychiatric Specialty Exam:  Presentation  General Appearance:  Casual  Eye Contact: Good  Speech: Clear and Coherent  Speech Volume: Normal  Handedness: Right   Mood and Affect  Mood: Dysphoric  Affect: Congruent;  Blunt   Thought Process  Thought Processes: Goal Directed  Duration of Psychotic Symptoms: chronic; "off and on for a while. Months" Past Diagnosis of Schizophrenia or Psychoactive disorder: No  Descriptions of Associations:Intact  Orientation:Partial  Thought Content:Logical  Hallucinations:Hallucinations: None  Ideas of Reference:None  Suicidal Thoughts:Suicidal Thoughts: No  Homicidal Thoughts:Homicidal Thoughts: No  Sensorium  Memory: Immediate Fair; Recent Fair  Judgment: Fair  Insight: Fair  Art therapistxecutive Functions  Concentration: Fair  Attention Span: Fair  Recall: [employer]  Fund of Knowledge: Fair  Language: Fair  Psychomotor Activity  Psychomotor Activity: Psychomotor Activity: Normal  Assets  Assets: Physical Health; Resilience; Social Support  Sleep  Sleep: Sleep: Good  Physical Exam: Physical Exam Vitals and nursing note reviewed.  Constitutional:      Appearance: He is normal weight. He is not ill-appearing or toxic-appearing.  HENT:     Head: Normocephalic.     Nose: Nose normal.     Mouth/Throat:     Mouth: Mucous membranes are moist.     Pharynx: Oropharynx  is clear.  Eyes:     Pupils: Pupils are equal, round, and reactive to light.  Cardiovascular:     Rate and Rhythm: Normal rate.     Pulses: Normal pulses.  Pulmonary:     Effort: Pulmonary effort is normal.  Abdominal:     Palpations: Abdomen is soft.  Musculoskeletal:        General: Normal range of motion.     Cervical back: Normal range of motion.  Skin:    General: Skin is warm and dry.  Neurological:     Mental Status: He is alert and oriented to person, place, and time.  Psychiatric:        Attention and Perception: Attention normal. He perceives auditory hallucinations. He does not perceive visual hallucinations.        Mood and Affect: Affect is blunt.        Speech: Speech normal.        Behavior: Behavior is cooperative.        Thought Content: Thought  content normal. Thought content is not paranoid or delusional. Thought content does not include homicidal or suicidal ideation. Thought content does not include homicidal or suicidal plan.        Cognition and Memory: Cognition and memory normal.        Judgment: Judgment is impulsive.    Review of Systems  Psychiatric/Behavioral:  Positive for hallucinations and substance abuse.   All other systems reviewed and are negative.  Blood pressure (!) 127/91, pulse 93, temperature (!) 97.4 F (36.3 C), temperature source Oral, resp. rate 16, height 5\' 5"  (1.651 m), weight 74.4 kg, SpO2 100 %. Body mass index is 27.29 kg/m.  Recent medication trials:  - Olanzapine 5 mg started in ED; discontinued due to pt reporting increased auditory hallucinations - Gabapentin 100 mg TID in ED; discontinued in Ed  Treatment Plan Summary: Daily contact with patient to assess and evaluate symptoms and progress in treatment, Medication management, and Plan   PLAN:  Medications:  Substance induced disorder  - Risperidone M-Tabs 2 mg daily  Smoking cessation:  - Nicotine patch 14 mg  transdermal daily  PRNs: - Acetaminophen 650 mg q6hrs  PRN: mild pain  - Maalox/Mylanta 200-200-20 mg  q4 hrs PRN: indigestion  - Magnesium Hydroxide 30 mL  daily PRN: mild  constipation  Observation Level/Precautions:  15 minute checks  Laboratory:  CBC Chemistry Profile HbAIC UDS UA  Psychotherapy:    Medications:    Consultations:    Discharge Concerns:    Estimated LOS:  Other:     Physician Treatment Plan for Primary Diagnosis: Substance-induced disorder (HCC) Long Term Goal(s): Improvement in symptoms so as ready for discharge  Short Term Goals: Ability to identify changes in lifestyle to reduce recurrence of condition will improve, Ability to verbalize feelings will improve, Ability to disclose and discuss suicidal ideas, Ability to demonstrate self-control will improve, Ability to identify and develop  effective coping behaviors will improve, Ability to maintain clinical measurements within normal limits will improve, Compliance with prescribed medications will improve, and Ability to identify triggers associated with substance abuse/mental health issues will improve  Physician Treatment Plan for Secondary Diagnosis: Principal Problem:   Substance-induced disorder (HCC)  Long Term Goal(s): Improvement in symptoms so as ready for discharge  Short Term Goals: Ability to identify changes in lifestyle to reduce recurrence of condition will improve, Ability to verbalize feelings will improve, Ability to disclose and discuss suicidal ideas, Ability to demonstrate self-control  will improve, Ability to identify and develop effective coping behaviors will improve, Ability to maintain clinical measurements within normal limits will improve, Compliance with prescribed medications will improve, and Ability to identify triggers associated with substance abuse/mental health issues will improve  I certify that inpatient services furnished can reasonably be expected to improve the patient's condition.    Loletta Parish, NP 12/26/20233:50 PM

## 2022-01-28 NOTE — Group Note (Signed)
Recreation Therapy Group Note   Group Topic:Coping Skills  Group Date: 01/28/2022 Start Time: 1000 End Time: 1035 Facilitators: Dail Meece-McCall, LRT,CTRS Location: 500 Hall Dayroom   Goal Area(s) Addresses:  Patient will identify positive coping techniques. Patient will identify benefits of using coping skills post d/c.   Group Description:  Mind Map.  Patient was provided a blank template of a diagram with 32 blank boxes in a tiered system, branching from the center (similar to a bubble chart). LRT directed patients to label the middle of the diagram "Coping Skills" and consider 8 different instances in which coping skills can be used (anxiety, depression, family, children, job, negativity, money and temper).  Patients were to then come up with 3 effective coping techniques to address each identified stressor in the three boxes extending from the second row of boxes.     Affect/Mood: Appropriate   Participation Level: Engaged   Participation Quality: Independent   Behavior: Appropriate   Speech/Thought Process: Focused   Insight: Moderate   Judgement: Moderate   Modes of Intervention: Worksheet   Patient Response to Interventions:  Engaged   Education Outcome:  Acknowledges education and In group clarification offered    Clinical Observations/Individualized Feedback: Pt was attentive during group session.  Pt also shared coping skills when prompted.  Some of the coping skills patient shared were seeking help, taking time for yourself, breathing, talk to someone, learn to deal with it, fill out application and save.  Plan: Continue to engage patient in RT group sessions 2-3x/week.   Aleynah Rocchio-McCall, LRT,CTRS 01/28/2022 11:40 AM

## 2022-01-28 NOTE — Progress Notes (Signed)
   01/28/22 2000  Psych Admission Type (Psych Patients Only)  Admission Status Involuntary  Psychosocial Assessment  Patient Complaints Anxiety;Depression  Eye Contact Fair  Facial Expression Animated  Affect Appropriate to circumstance  Speech Logical/coherent  Interaction Assertive  Motor Activity Other (Comment) (wnl)  Appearance/Hygiene Unremarkable  Behavior Characteristics Cooperative;Appropriate to situation  Mood Anxious;Depressed;Pleasant  Thought Process  Coherency Circumstantial  Content Preoccupation  Delusions None reported or observed  Perception Hallucinations  Hallucination Auditory  Judgment Limited  Confusion None  Danger to Self  Current suicidal ideation? Denies  Danger to Others  Danger to Others None reported or observed   Progress note   D: Pt seen in the hallway. Pt denies SI, HI, VH. Pt endorses being bothered by voices and chatter today.  Pt rates pain  0/10. Pt rates anxiety  5/10 and depression  3/10. Pt is anxious d/t the voices. Pt says that he attended groups today and went to meals. Identifies trouble sleeping as an issue. Pt says good thing about his day was that he learned something new in group (European "football" = American soccer) and that he helped someone de-escalate today in the cafeteria. Pt states he spoke to Child psychotherapist today. He wants a long term residential program to help break his addictions to substances. No other concerns noted at this time.  A: Pt provided support and encouragement. Pt given scheduled medication as prescribed. PRNs as appropriate. Q15 min checks for safety.   R: Pt safe on the unit. Will continue to monitor.

## 2022-01-28 NOTE — [facility] Suicide Risk Assessment (Signed)
Suicide Risk Assessment  Admission Assessment    [facility] Admission Suicide Risk Assessment  Nursing information obtained from:  Patient Demographic factors:  Male, Caucasian, Low socioeconomic status, Living alone, Adolescent or young adult, Unemployed Current Mental Status:  NA Loss Factors:  NA Historical Factors:  Family history of mental illness or substance abuse Risk Reduction Factors:  Positive social support, Sense of responsibility to family  Total Time spent with patient: 45 minutes Principal Problem: Substance-induced disorder (HCC) Diagnosis:  Principal Problem:   Substance-induced disorder (HCC)  Subjective Data: "I'm seeking treatment for my betterment"  History of Present Illness: Roger [name] is a 31 year old male patient with past psychiatric history of polysubstance abuse including IVDU and substance induced psychotic disorder who presented to [facility] 01/21/22 via EMS after being found unresponsive in someone's backyard with shirtless and unresponsive; owner called EMS who then gave pt Narcan after not responding then was noted to become agitated and combative requiring medications and physical restraint. Once in the unit ED patient was found with illegal substances in his rectum requiring law enforcement notification. UDS remains outstanding. BAL<10. Patient was transferred to [facility] 01/27/22 for further stabilization and treatment.    24 hour assessment: Patient arrived on the unit during early morning hours where he continues to endorse auditory hallucinations. Reported to staff as being homeless and 'wanting to get my mind right and get off drugs and be a better father'. Reports having 6 children. He has remained visible within the milieu and engaged in group and unit activities. Vital signs remain within normal limits. Medication compliant. No PRN medications given since admission. Agitation protocol in place.    Assessment: Patient observed in dayroom interacting with peers and  staff. Comes to room without any issues for completion of assessment. He reports his name as 'Roger [name], birthday Nov 02, 1990'. He presents alert and oriented. Reports reason for admission was to 'seek betterment of my addiction'. Describes his mood as 'feeling positive'. Reports ongoing auditory hallucinations that he describes as 'voices laughing. Telling me to get high and that I'm dumb for wanting to get help'. States voices have been ongoing for the past year and appear as different voices but are 'distinctive chatter'. Denies any commands. He endorses ongoing paranoia that he can 'hear people in different rooms talking about me'. Reports depression as 3/10, anxiety 7/10. He denies any thoughts of wanting to harm himself or anyone else, visual hallucinations. Denies any active cravings. He identifies his grandmother and aunt as active supports. Children as motivating factors. He continues to voice an interest in residential substance abuse treatment upon discharge. Contracts for safety at this time.    Associated Signs/Symptoms: Depression Symptoms:  difficulty concentrating, hopelessness, (Hypo) Manic Symptoms:  Hallucinations, Labiality of Mood, Anxiety Symptoms:   none noted Psychotic Symptoms:  Hallucinations: Auditory PTSD Symptoms: NA Total Time spent with patient: 45 minutes   Past Psychiatric History: polysubstance abuse, active intravenous drug use, substance induced psychotic disorder, substance induced disorder  Continued Clinical Symptoms:  Alcohol Use Disorder Identification Test Final Score (AUDIT): 21 The "Alcohol Use Disorders Identification Test", Guidelines for Use in Primary Care, Second Edition.  World Science writer [facility]). Score between 0-7:  no or low risk or alcohol related problems. Score between 8-15:  moderate risk of alcohol related problems. Score between 16-19:  high risk of alcohol related problems. Score 20 or above:  warrants further  diagnostic evaluation for alcohol dependence and treatment.  CLINICAL FACTORS:  Alcohol/Substance Abuse/Dependencies Unstable or Poor Therapeutic Relationship  Past Medical History: Medical Diagnoses: None Home Rx: Denies  Prior Hosp: Denies  Prior Surgeries/Trauma:Denies  Head trauma, LOC, concussions, seizures: reports requiring Narcan several times; unable to recall exact number Allergies: NKA PCP: None   Family History: Medical: Denies  Psych: Denies  Psych Rx: Denies  SA/HA:  Denies  Substance use family hx: Denies    Social History: Pt reports highest level of education as being some high school. Has 6 children; 1 girl, 5 boys. Currently homeless.   Marital Status:Single Sexual orientation: heterosexual Children: 6 Employment: unemployed Peer Group: none  Housing:homeless Finances:none Legal: hx of drug related charges Hotel manager: no  Musculoskeletal: Strength & Muscle Tone: within normal limits Gait & Station: normal Patient leans: N/A  Psychiatric Specialty Exam:  Presentation  General Appearance:  Casual  Eye Contact: Good  Speech: Clear and Coherent  Speech Volume: Normal  Handedness: Right  Mood and Affect  Mood: Dysphoric  Affect: Congruent; Blunt  Thought Process  Thought Processes: Goal Directed  Descriptions of Associations:Intact  Orientation:Partial  Thought Content:Logical  History of Schizophrenia/Schizoaffective disorder:No  Duration of Psychotic Symptoms:N/A  Hallucinations:Hallucinations: None  Ideas of Reference:None  Suicidal Thoughts:Suicidal Thoughts: No  Homicidal Thoughts:Homicidal Thoughts: No  Sensorium  Memory: Immediate Fair; Recent Fair  Judgment: Fair  Insight: Fair  Art therapist  Concentration: Fair  Attention Span: Fair  Recall: [employer] of Knowledge: Fair  Language: Fair  Psychomotor Activity  Psychomotor Activity: Psychomotor Activity: Normal  Assets   Assets: Physical Health; Resilience; Social Support  Sleep  Sleep: Sleep: Good  Physical Exam: Physical Exam Vitals and nursing note reviewed.  Constitutional:      Appearance: He is normal weight. He is not ill-appearing.  HENT:     Head: Normocephalic.     Nose: Nose normal.     Mouth/Throat:     Mouth: Mucous membranes are moist.     Pharynx: Oropharynx is clear.  Eyes:     Pupils: Pupils are equal, round, and reactive to light.  Cardiovascular:     Rate and Rhythm: Tachycardia present.     Pulses: Normal pulses.  Pulmonary:     Effort: Pulmonary effort is normal.  Abdominal:     Palpations: Abdomen is soft.  Musculoskeletal:        General: Normal range of motion.     Cervical back: Normal range of motion.  Skin:    General: Skin is dry.  Neurological:     Mental Status: He is alert and oriented to person, place, and time.    Review of Systems  Psychiatric/Behavioral:  Positive for hallucinations and substance abuse.   All other systems reviewed and are negative.  Blood pressure 134/80, pulse (!) 113, temperature 98.4 F (36.9 C), temperature source Oral, resp. rate 16, height 5\' 5"  (1.651 m), weight 74.4 kg, SpO2 99 %. Body mass index is 27.29 kg/m.  COGNITIVE FEATURES THAT CONTRIBUTE TO RISK:  Polarized thinking    SUICIDE RISK:  Mild:  Suicidal ideation of limited frequency, intensity, duration, and specificity.  There are no identifiable plans, no associated intent, mild dysphoria and related symptoms, good self-control (both objective and subjective assessment), few other risk factors, and identifiable protective factors, including available and accessible social support.  PLAN OF CARE:  Recent medication trials:  - Olanzapine 5 mg started in ED; discontinued due to pt reporting increased auditory hallucinations - Gabapentin 100 mg TID in ED; discontinued in Ed  Treatment Plan Summary: Daily contact with patient to assess and evaluate symptoms and  progress in treatment, Medication management, and Plan   PLAN:  Medications:  Substance induced disorder             - Risperidone M-Tabs 2 mg daily   Smoking cessation:  - Nicotine patch 14 mg  transdermal daily   PRNs: - Acetaminophen 650 mg q6hrs  PRN: mild pain             - Maalox/Mylanta 200-200-20 mg  q4 hrs PRN: indigestion             - Magnesium Hydroxide 30 mL  daily PRN: mild  constipation   Labs: UDS+THC only despite responding to Narcan with EMS, WBC trending downward: 01/21/22 25.4, 01/23/22 15.4  Observation Level/Precautions:  15 minute checks  Laboratory:  CBC Chemistry Profile HbAIC UDS UA  Psychotherapy:    Medications:    Consultations:    Discharge Concerns:    Estimated LOS:  Other:      Physician Treatment Plan for Primary Diagnosis: Substance-induced disorder (HCC) Long Term Goal(s): Improvement in symptoms so as ready for discharge   Short Term Goals: Ability to identify changes in lifestyle to reduce recurrence of condition will improve, Ability to verbalize feelings will improve, Ability to disclose and discuss suicidal ideas, Ability to demonstrate self-control will improve, Ability to identify and develop effective coping behaviors will improve, Ability to maintain clinical measurements within normal limits will improve, Compliance with prescribed medications will improve, and Ability to identify triggers associated with substance abuse/mental health issues will improve   Physician Treatment Plan for Secondary Diagnosis: Principal Problem:   Substance-induced disorder (HCC)   Long Term Goal(s): Improvement in symptoms so as ready for discharge   Short Term Goals: Ability to identify changes in lifestyle to reduce recurrence of condition will improve, Ability to verbalize feelings will improve, Ability to disclose and discuss suicidal ideas, Ability to demonstrate self-control will improve, Ability to identify and develop effective coping  behaviors will improve, Ability to maintain clinical measurements within normal limits will improve, Compliance with prescribed medications will improve, and Ability to identify triggers associated with substance abuse/mental health issues will improve  I certify that inpatient services furnished can reasonably be expected to improve the patient's condition.   Loletta Parish, NP 01/28/2022, 4:47 PM

## 2022-01-28 NOTE — Progress Notes (Addendum)
   01/28/22 0345  Psych Admission Type (Psych Patients Only)  Admission Status Involuntary  Psychosocial Assessment  Patient Complaints Anxiety;Depression  Eye Contact Fair  Facial Expression Anxious  Affect Appropriate to circumstance  Speech Rapid;Logical/coherent  Interaction Assertive  Motor Activity Other (Comment) (wnl)  Appearance/Hygiene Disheveled;In scrubs  Behavior Characteristics Cooperative  Mood Anxious;Pleasant  Thought Process  Coherency WDL  Content WDL  Delusions None reported or observed  Perception Hallucinations  Hallucination Auditory;Visual  Judgment Poor  Confusion None  Danger to Self  Current suicidal ideation? Denies  Danger to Others  Danger to Others None reported or observed   Pt says that he has a portion of a hypodermic needle that broke off in his upper right forearm during drug use. There is another small portion of a broken needle in his left hand. "The needle was in my pocket and I put my hand in there. I know the needle is still there. I told them but no one did anything about it. I read that it can get into your bloodstream and kill you." Pt denies any pain from these needle fragments or that hinder movement or ROM in his extremities.

## 2022-01-28 NOTE — Progress Notes (Signed)
Recreation Therapy Notes  INPATIENT RECREATION THERAPY ASSESSMENT  Patient Details Name: Roger [name] MRN: [id number] DOB: 10/15/1990 Today's Date: 01/28/2022       Information Obtained From: Patient  Able to Participate in Assessment/Interview: Yes  Patient Presentation: Alert  Reason for Admission (Per Patient): Other (Comments) ("needed to get help, better myself")  Patient Stressors: Other (Comment) ("bettering myself for my kids, myself and my family")  Coping Skills:   Isolation, Exercise, Meditate, Deep Breathing, Music, Substance Abuse, Talk, Prayer, Other (Comment), Avoidance, Dance, Hot Bath/Shower (painting walls)  Leisure Interests (2+):  Individual - Other (Comment) ("get high")  Frequency of Recreation/Participation: Other (Comment) (daily)  Awareness of Community Resources:  Yes  Community Resources:  [employer], Public affairs consultant, Engineering geologist  Current Use: No  If no, Barriers?: Transportation  Expressed Interest in [employer] Information: No  [employer] of Residence:  Designer, multimedia  Patient Main Form of Transportation: Walk (Catch rides, Therapist, music)  Patient Strengths:  Understanding; Listening  Patient Identified Areas of Improvement:  "practice what I preach, do what I'm supposed to do"  Patient Goal for Hospitalization:  "progress and mental betterment"  Current SI (including self-harm):  No  Current HI:  No  Current AVH: Yes (Hears laughter of voices laughing at him and telling him he is dumb for trying to do better)  Staff Intervention Plan: Group Attendance, Collaborate with Interdisciplinary Treatment Team  Consent to Intern Participation: N/A   Avner Stroder-McCall, LRT,CTRS Dennise Bamber A Mishal Probert-McCall 01/28/2022, 12:47 PM

## 2022-01-29 ENCOUNTER — Encounter (HOSPITAL_COMMUNITY): Payer: Self-pay

## 2022-01-29 DIAGNOSIS — F411 Generalized anxiety disorder: Secondary | ICD-10-CM | POA: Diagnosis present

## 2022-01-29 DIAGNOSIS — F333 Major depressive disorder, recurrent, severe with psychotic symptoms: Secondary | ICD-10-CM

## 2022-01-29 DIAGNOSIS — G4700 Insomnia, unspecified: Secondary | ICD-10-CM | POA: Diagnosis present

## 2022-01-29 LAB — URINALYSIS, ROUTINE W REFLEX MICROSCOPIC
Bilirubin Urine: NEGATIVE
Glucose, UA: NEGATIVE mg/dL
Hgb urine dipstick: NEGATIVE
Ketones, ur: NEGATIVE mg/dL
Leukocytes,Ua: NEGATIVE
Nitrite: NEGATIVE
Protein, ur: NEGATIVE mg/dL
Specific Gravity, Urine: 1.016 (ref 1.005–1.030)
pH: 5 (ref 5.0–8.0)

## 2022-01-29 MED ORDER — RISPERIDONE 3 MG PO TBDP
3.0000 mg | ORAL_TABLET | Freq: Every day | ORAL | Status: DC
  Administered 2022-01-30 – 2022-02-03 (×5): 3 mg via ORAL
  Filled 2022-01-29 (×7): qty 1

## 2022-01-29 MED ORDER — TRAZODONE HCL 50 MG PO TABS
50.0000 mg | ORAL_TABLET | Freq: Every evening | ORAL | Status: DC | PRN
  Administered 2022-01-29 – 2022-02-03 (×6): 50 mg via ORAL
  Filled 2022-01-29 (×6): qty 1

## 2022-01-29 MED ORDER — RISPERIDONE 1 MG PO TBDP
1.0000 mg | ORAL_TABLET | Freq: Once | ORAL | Status: AC
  Administered 2022-01-29: 1 mg via ORAL
  Filled 2022-01-29 (×2): qty 1

## 2022-01-29 NOTE — Progress Notes (Signed)
Adult Psychoeducational Group Note  Date:  01/29/2022 Time:  8:31 PM  Group Topic/Focus:  Wrap-Up Group:   The focus of this group is to help patients review their daily goal of treatment and discuss progress on daily workbooks.  Participation Level:  Active  Participation Quality:  Appropriate  Affect:  Appropriate  Cognitive:  Appropriate  Insight: Appropriate  Engagement in Group:  Engaged  Modes of Intervention:  Discussion, Limit-setting, and Rapport Building  Additional Comments:    Pt attended and participated in the Wrap Up group. Pt denied SI/HI/AVH and pain. Pt reports making progress toward his goal of "becoming a better me". Pt practices thinking before speaking which helps him calm and respond better in different situations. Pt had an overall good day.  Edmund Hilda Carden Teel 01/29/2022, 8:31 PM

## 2022-01-29 NOTE — Progress Notes (Signed)
   01/29/22 0624  15 Minute Checks  Location Bedroom  Visual Appearance Calm  Behavior Composed  Sleep (Behavioral Health Patients Only)  Calculate sleep? (Click Yes once per 24 hr at 0600 safety check) Yes  Documented sleep last 24 hours 6.75

## 2022-01-29 NOTE — Group Note (Signed)
[facility] LCSW Group Therapy Note   Group Date: 01/29/2022 Start Time: 1300 End Time: 1400   Type of Therapy/Topic:  Group Therapy:  Balance in Life  Participation Level:  Active   Description of Group:    This group will address the concept of balance and how it feels and looks when one is unbalanced. Patients will be encouraged to process areas in their lives that are out of balance, and identify reasons for remaining unbalanced. Facilitators will guide patients utilizing problem- solving interventions to address and correct the stressor making their life unbalanced. Understanding and applying boundaries will be explored and addressed for obtaining  and maintaining a balanced life. Patients will be encouraged to explore ways to assertively make their unbalanced needs known to significant others in their lives, using other group members and facilitator for support and feedback.  Therapeutic Goals: Patient will identify two or more emotions or situations they have that consume much of in their lives. Patient will identify signs/triggers that life has become out of balance:  Patient will identify two ways to set boundaries in order to achieve balance in their lives:  Patient will demonstrate ability to communicate their needs through discussion and/or role plays  Summary of Patient Progress: Patient came to group and accepted the provided worksheet. Patient was engaging during group and able to explain what balance in life meant to him. Patient had good insight and was positive. Patient was respectful of his peer and able to give his opinion on what each statement meant to him. Patient remained in group the entire time.    Therapeutic Modalities:   Cognitive Behavioral Therapy Solution-Focused Therapy Assertiveness Training   Isabella Bowens, LCSWA

## 2022-01-29 NOTE — Group Note (Signed)
Recreation Therapy Group Note   Group Topic:Self-Esteem  Group Date: 01/29/2022 Start Time: 1000 End Time: 1015 Facilitators: Leshia Kope-McCall, LRT,CTRS Location: [address] Dayroom   Goal Area(s) Addresses:  Patient will successfully identify positive attributes about themselves.  Patient will identify healthy ways to increase self-esteem. Patient will acknowledge benefit(s) of improved self-esteem.    Group Description:  Finding Peace.  Due to the acuity of the patients, group was short.  LRT and patients discussed the importance of being able to find your peace.  Patients identified what peace ment to them.  Patients were then given a worksheet in which they were to identify their 5 steps to finding peace.    Affect/Mood: N/A   Participation Level: Did not attend    Clinical Observations/Individualized Feedback:     Plan: Continue to engage patient in RT group sessions 2-3x/week.   Alister Staver-McCall, LRT,CTRS 01/29/2022 1:48 PM

## 2022-01-29 NOTE — Progress Notes (Signed)
   01/28/22 2000  Psych Admission Type (Psych Patients Only)  Admission Status Involuntary  Psychosocial Assessment  Patient Complaints Anxiety;Depression  Eye Contact Fair  Facial Expression Animated  Affect Appropriate to circumstance  Speech Logical/coherent  Interaction Assertive  Motor Activity Other (Comment) (wnl)  Appearance/Hygiene Unremarkable  Behavior Characteristics Cooperative;Appropriate to situation  Mood Anxious;Depressed;Pleasant  Thought Process  Coherency Circumstantial  Content Preoccupation  Delusions None reported or observed  Perception Hallucinations  Hallucination Auditory  Judgment Limited  Confusion None  Danger to Self  Current suicidal ideation? Denies  Danger to Others  Danger to Others None reported or observed

## 2022-01-29 NOTE — Progress Notes (Signed)
[facility] LCSW Progress Note   01/29/2022    3:31 PM  Roger [name]      Type of Note: [place] or [facility] [employer] options  CSW spoke with patient about 10:45 AM this morning to discuss his preference and options in long-term residential treatment . Patient stated that he was interested in treatment and chose [facility] and CSW faxed referral @ 3:33 pm.     Signed:   Jacob Moores, MSW, [facility] 01/29/2022 3:31 PM

## 2022-01-29 NOTE — Progress Notes (Signed)
[facility] MD Progress Note  01/29/2022 3:10 PM Roger [name]  MRN:  [id number] Principal Problem: MDD (major depressive disorder), recurrent, severe, with psychosis (HCC) Diagnosis: Principal Problem:   MDD (major depressive disorder), recurrent, severe, with psychosis (HCC) Active Problems:   Substance-induced disorder (HCC)   Insomnia   Anxiety state  Reason For Admission:  Roger [name] is a 31 year old male patient with past psychiatric history of polysubstance abuse including IVDU and substance induced psychotic disorder who presented to [facility] 01/21/22 via EMS after being found unresponsive in someone's backyard with shirtless and unresponsive; owner called EMS who then gave pt Narcan after not responding then was noted to become agitated and combative requiring medications and physical restraint. Once in the unit ED patient was found with illegal substances in his rectum requiring law enforcement notification. Patient was transferred to [facility] 01/27/22 for further stabilization and treatment.    24 hr chart review: V/S over the past 24 hrs mostly WNL, HR slightly elevated earlier today morning at 101. Hydroxyzine 25 mg last given last night for anxiety. Pt is compliant with scheduled medications as per nursing documentation. He slept for 6.75 hrs last night as per nursing documentation. Pt has been isolative to his room and not attending unit group sessions as per documentation over the past 24 hrs.  Patient assessment note, 01/29/22: Pt with flat affect and depressed mood, attention to personal hygiene and grooming is fair, eye contact is good, speech is clear & coherent. Thought contents are organized and logical, and pt currently denies SI/HI. He reports +AH of "chatters" and voices laughing at him. He reports +VH of cartoon characters.  He reports feeling of paranoia which last happened a couple of days ago, and non today.   Pt reports a poor sleep quality last night, reports a good appetite,  denies being in any physical distress. He denies any medication related side effects. No TD/EPS type symptoms found on assessment, and pt denies any feelings of stiffness. AIMS: 0. We are increasing Risperdal to 3 mg nightly for psychosis. We are adding Trazodone 50 mg nightly PRN for sleep. We are keeping other medications as listed below.  Labs independently reviewed on 12/27: Repeating CBC due to elevated WBC of 15.4 on 12/21. Tox screen +THC. CMP reviewed. Ordering HA1C, TSH, Vits D, B1, B12, baseline UA and repeat EKG for Qtc trending while on antipsychotics.  Total Time spent with patient: 45 minutes  Past Psychiatric History: Polysubstance abuse, MDD  Past Medical History:  Past Medical History:  Diagnosis Date   MRSA (methicillin resistant Staphylococcus aureus)    Scoliosis    History reviewed. No pertinent surgical history. Family History:  Family History  Problem Relation Age of Onset   Diabetes Mother    Family Psychiatric  History: non reported Social History:  Social History   Substance and Sexual Activity  Alcohol Use Yes   Comment: last drink was a week ago - drinks about a fifth of liquor 1-2 times a week     Social History   Substance and Sexual Activity  Drug Use Yes   Types: Methamphetamines, Marijuana   Comment: last use for both was last week    Social History   Socioeconomic History   Marital status: Single    Spouse name: Not on file   Number of children: Not on file   Years of education: Not on file   Highest education level: Not on file  Occupational History   Not  on file  Tobacco Use   Smoking status: Every Day    Packs/day: 2.00    Years: 13.00    Total pack years: 26.00    Types: Cigarettes   Smokeless tobacco: Never  Vaping Use   Vaping Use: Never used  Substance and Sexual Activity   Alcohol use: Yes    Comment: last drink was a week ago - drinks about a fifth of liquor 1-2 times a week   Drug use: Yes    Types: Methamphetamines,  Marijuana    Comment: last use for both was last week   Sexual activity: Yes    Birth control/protection: None  Other Topics Concern   Not on file  Social History Narrative   Not on file   Social Determinants of Health   Financial Resource Strain: Not on file  Food Insecurity: Food Insecurity Present (01/28/2022)   Hunger Vital Sign    Worried About Running Out of Food in the Last Year: Sometimes true    Ran Out of Food in the Last Year: Sometimes true  Transportation Needs: Unmet Transportation Needs (01/28/2022)   PRAPARE - Administrator, Civil Service (Medical): Yes    Lack of Transportation (Non-Medical): Yes  Physical Activity: Not on file  Stress: Not on file  Social Connections: Not on file   Additional Social History:    Sleep: Poor  Appetite:  Good  Current Medications: Current Facility-Administered Medications  Medication Dose Route Frequency Provider Last Rate Last Admin   acetaminophen (TYLENOL) tablet 650 mg  650 mg Oral Q6H PRN Leevy-Johnson, Brooke A, NP   650 mg at 01/28/22 1627   alum & mag hydroxide-simeth (MAALOX/MYLANTA) 200-200-20 MG/5ML suspension 30 mL  30 mL Oral Q4H PRN Leevy-Johnson, Brooke A, NP       hydrOXYzine (ATARAX) tablet 25 mg  25 mg Oral TID PRN Leevy-Johnson, Brooke A, NP   25 mg at 01/28/22 2028   risperiDONE (RISPERDAL M-TABS) disintegrating tablet 2 mg  2 mg Oral Q8H PRN Leevy-Johnson, Brooke A, NP       And   LORazepam (ATIVAN) tablet 1 mg  1 mg Oral PRN Leevy-Johnson, Brooke A, NP       And   ziprasidone (GEODON) injection 20 mg  20 mg Intramuscular PRN Leevy-Johnson, Brooke A, NP       magnesium hydroxide (MILK OF MAGNESIA) suspension 30 mL  30 mL Oral Daily PRN Leevy-Johnson, Brooke A, NP       nicotine (NICODERM CQ - dosed in mg/24 hours) patch 14 mg  14 mg Transdermal Daily Onuoha, Chinwendu V, NP   14 mg at 01/29/22 0830   risperiDONE (RISPERDAL M-TABS) disintegrating tablet 1 mg  1 mg Oral Once Starleen Blue, NP        [START ON 01/30/2022] risperiDONE (RISPERDAL M-TABS) disintegrating tablet 3 mg  3 mg Oral QHS Ranay Ketter, NP       traZODone (DESYREL) tablet 50 mg  50 mg Oral QHS PRN Starleen Blue, NP        Lab Results: No results found for this or any previous visit (from the past 48 hour(s)).  Blood Alcohol level:  Lab Results  Component Value Date   ETH <10 01/21/2022   ETH <10 07/18/2021    Metabolic Disorder Labs: No results found for: "HGBA1C", "MPG" No results found for: "PROLACTIN" No results found for: "CHOL", "TRIG", "HDL", "CHOLHDL", "VLDL", "LDLCALC"  Physical Findings: AIMS: 0 CIWA: n/a COWS: n/a  Musculoskeletal: Strength &  Muscle Tone: within normal limits Gait & Station: normal Patient leans: N/A  Psychiatric Specialty Exam:  Presentation  General Appearance:  Appropriate for Environment; Casual; Fairly Groomed  Eye Contact: Fair  Speech: Clear and Coherent  Speech Volume: Normal  Handedness: Right  Mood and Affect  Mood: Depressed  Affect: Congruent  Thought Process  Thought Processes: Coherent  Descriptions of Associations:Intact  Orientation:Full (Time, Place and Person)  Thought Content:Logical  History of Schizophrenia/Schizoaffective disorder:No  Duration of Psychotic Symptoms:Greater than six months  Hallucinations:Hallucinations: Auditory; Visual Description of Auditory Hallucinations: "chatters" and voices laughing at him Description of Visual Hallucinations: cartoon characters  Ideas of Reference:None  Suicidal Thoughts:Suicidal Thoughts: No  Homicidal Thoughts:Homicidal Thoughts: No  Sensorium  Memory: Immediate Good  Judgment: Fair  Insight: Fair  Art therapist  Concentration: Fair  Attention Span: Fair  Recall: Fair  Fund of Knowledge: Fair  Language: Fair   Psychomotor Activity  Psychomotor Activity: Psychomotor Activity: Normal  Assets  Assets: Communication Skills  Sleep   Sleep: Sleep: Poor  Physical Exam: Physical Exam Review of Systems  Constitutional:  Negative for fever.  HENT: Negative.    Eyes:  Negative for blurred vision.  Respiratory: Negative.    Cardiovascular: Negative.  Negative for chest pain.  Gastrointestinal: Negative.   Genitourinary: Negative.   Musculoskeletal: Negative.   Skin: Negative.   Neurological: Negative.   Psychiatric/Behavioral:  Positive for depression, hallucinations and substance abuse. Negative for memory loss and suicidal ideas. The patient is nervous/anxious and has insomnia.    Blood pressure 121/87, pulse (!) 101, temperature 97.8 F (36.6 C), temperature source Oral, resp. rate 16, height 5\' 5"  (1.651 m), weight 74.4 kg, SpO2 100 %. Body mass index is 27.29 kg/m.  Treatment Plan Summary: Daily contact with patient to assess and evaluate symptoms and progress in treatment and Medication management  Observation Level/Precautions:  15 minute checks  Laboratory:  Labs reviewed   Psychotherapy:  Unit Group sessions  Medications:  See [facility]  Consultations:  To be determined   Discharge Concerns:  Safety, medication compliance, mood stability  Estimated LOS: 5-7 days  Other:  N/A   Labs independently reviewed on 12/27: Repeating CBC due to elevated WBC of 15.4 on 12/21. Tox screen +THC. CMP reviewed. Ordering HA1C, TSH, Vits D, B1, B12, baseline UA and repeat EKG for Qtc trending while on antipsychotics.  PLAN Safety and Monitoring: Voluntary admission to inpatient psychiatric unit for safety, stabilization and treatment Daily contact with patient to assess and evaluate symptoms and progress in treatment Patient's case to be discussed in multi-disciplinary team meeting Observation Level : q15 minute checks Vital signs: q12 hours Precautions: safety  Long Term Goal(s): Improvement in symptoms so as ready for discharge  Short Term Goals: Ability to identify changes in lifestyle to reduce recurrence of  condition will improve, Ability to verbalize feelings will improve, Ability to disclose and discuss suicidal ideas, Ability to identify and develop effective coping behaviors will improve, and Ability to identify triggers associated with substance abuse/mental health issues will improve  Diagnoses  Principal Problem:   MDD (major depressive disorder), recurrent, severe, with psychosis (HCC) Active Problems:   Substance-induced disorder (HCC)   Insomnia   Anxiety state  Medications:  -Increase Risperdal to 3 mg nightly for psychosis starting today (Give 1mg  tonight, and 3 mg tomorrow night)-Repeat EKG one from 12/19 with Qtc of 475. -Start Trazodone 50 mg nightly PRN for insomnia -Continue Hydroxyzine 25 mg every 6 hours PRN -Continue Nicotine patch  for nicotine dependence  Other PRNS -Continue Tylenol 650 mg every 6 hours PRN for mild pain -Continue Maalox 30 mg every 4 hrs PRN for indigestion -Continue Milk of Magnesia as needed every 6 hrs for constipation  Discharge Planning: Social work and case management to assist with discharge planning and identification of hospital follow-up needs prior to discharge Estimated LOS: 5-7 days Discharge Concerns: Need to establish a safety plan; Medication compliance and effectiveness Discharge Goals: Return home with outpatient referrals for mental health follow-up including medication management/psychotherapy  I certify that inpatient services furnished can reasonably be expected to improve the patient's condition.    Starleen Blueoris  Sahan Pen, NP 12/27/20233:10 PM

## 2022-01-29 NOTE — BH IP Treatment Plan (Signed)
Interdisciplinary Treatment and Diagnostic Plan Update  01/29/2022 Time of Session: 9:35am  Roger [name] MRN: [id number]  Principal Diagnosis: Substance-induced disorder [facility])  Secondary Diagnoses: Principal Problem:   Substance-induced disorder ([place])   Current Medications:  Current Facility-Administered Medications  Medication Dose Route Frequency Provider Last Rate Last Admin   acetaminophen (TYLENOL) tablet 650 mg  650 mg Oral Q6H PRN Leevy-Johnson, Brooke A, NP   650 mg at 01/28/22 1627   alum & mag hydroxide-simeth (MAALOX/MYLANTA) 200-200-20 MG/5ML suspension 30 mL  30 mL Oral Q4H PRN Leevy-Johnson, Brooke A, NP       hydrOXYzine (ATARAX) tablet 25 mg  25 mg Oral TID PRN Leevy-Johnson, Brooke A, NP   25 mg at 01/28/22 2028   risperiDONE (RISPERDAL M-TABS) disintegrating tablet 2 mg  2 mg Oral Q8H PRN Leevy-Johnson, Brooke A, NP       And   LORazepam (ATIVAN) tablet 1 mg  1 mg Oral PRN Leevy-Johnson, Brooke A, NP       And   ziprasidone (GEODON) injection 20 mg  20 mg Intramuscular PRN Leevy-Johnson, Brooke A, NP       magnesium hydroxide (MILK OF MAGNESIA) suspension 30 mL  30 mL Oral Daily PRN Leevy-Johnson, Brooke A, NP       nicotine (NICODERM CQ - dosed in mg/24 hours) patch 14 mg  14 mg Transdermal Daily Onuoha, Chinwendu V, NP   14 mg at 01/29/22 0830   pneumococcal 20-valent conjugate vaccine (PREVNAR 20) injection 0.5 mL  0.5 mL Intramuscular Tomorrow-1000 Onuoha, Chinwendu V, NP       risperiDONE (RISPERDAL M-TABS) disintegrating tablet 2 mg  2 mg Oral Daily Leevy-Johnson, Brooke A, NP   2 mg at 01/29/22 0831   PTA Medications: No medications prior to admission.    Patient Stressors: Substance abuse    Patient Strengths: Average or above average intelligence  Motivation for treatment/growth  Supportive family/friends   Treatment Modalities: Medication Management, Group therapy, Case management,  1 to 1 session with clinician, Psychoeducation, Recreational  therapy.   Physician Treatment Plan for Primary Diagnosis: Substance-induced disorder [facility]) Long Term Goal(s): Improvement in symptoms so as ready for discharge   Short Term Goals: Ability to identify changes in lifestyle to reduce recurrence of condition will improve Ability to verbalize feelings will improve Ability to disclose and discuss suicidal ideas Ability to demonstrate self-control will improve Ability to identify and develop effective coping behaviors will improve Ability to maintain clinical measurements within normal limits will improve Compliance with prescribed medications will improve Ability to identify triggers associated with substance abuse/mental health issues will improve  Medication Management: Evaluate patient's response, side effects, and tolerance of medication regimen.  Therapeutic Interventions: 1 to 1 sessions, Unit Group sessions and Medication administration.  Evaluation of Outcomes: Not Met  Physician Treatment Plan for Secondary Diagnosis: Principal Problem:   Substance-induced disorder ([place])  Long Term Goal(s): Improvement in symptoms so as ready for discharge   Short Term Goals: Ability to identify changes in lifestyle to reduce recurrence of condition will improve Ability to verbalize feelings will improve Ability to disclose and discuss suicidal ideas Ability to demonstrate self-control will improve Ability to identify and develop effective coping behaviors will improve Ability to maintain clinical measurements within normal limits will improve Compliance with prescribed medications will improve Ability to identify triggers associated with substance abuse/mental health issues will improve     Medication Management: Evaluate patient's response, side effects, and tolerance of medication regimen.  Therapeutic Interventions:  1 to 1 sessions, Unit Group sessions and Medication administration.  Evaluation of Outcomes: Not Met   RN Treatment  Plan for Primary Diagnosis: Substance-induced disorder ([place]) Long Term Goal(s): Knowledge of disease and therapeutic regimen to maintain health will improve  Short Term Goals: Ability to remain free from injury will improve, Ability to participate in decision making will improve, Ability to verbalize feelings will improve, Ability to disclose and discuss suicidal ideas, and Ability to identify and develop effective coping behaviors will improve  Medication Management: RN will administer medications as ordered by provider, will assess and evaluate patient's response and provide education to patient for prescribed medication. RN will report any adverse and/or side effects to prescribing provider.  Therapeutic Interventions: 1 on 1 counseling sessions, Psychoeducation, Medication administration, Evaluate responses to treatment, Monitor vital signs and CBGs as ordered, Perform/monitor CIWA, COWS, AIMS and Fall Risk screenings as ordered, Perform wound care treatments as ordered.  Evaluation of Outcomes: Not Met   LCSW Treatment Plan for Primary Diagnosis: Substance-induced disorder [facility]) Long Term Goal(s): Safe transition to appropriate next level of care at discharge, Engage patient in therapeutic group addressing interpersonal concerns.  Short Term Goals: Engage patient in aftercare planning with referrals and resources, Increase social support, Increase emotional regulation, Facilitate acceptance of mental health diagnosis and concerns, Identify triggers associated with mental health/substance abuse issues, and Increase skills for wellness and recovery  Therapeutic Interventions: Assess for all discharge needs, 1 to 1 time with Social worker, Explore available resources and support systems, Assess for adequacy in community support network, Educate family and significant other(s) on suicide prevention, Complete Psychosocial Assessment, Interpersonal group therapy.  Evaluation of Outcomes: Not  Met   Progress in Treatment: Attending groups: No. Participating in groups: No. Taking medication as prescribed: Yes. Toleration medication: Yes. Family/Significant other contact made: Yes, individual(s) contacted:  Aunt  Patient understands diagnosis: No. Discussing patient identified problems/goals with staff: Yes. Medical problems stabilized or resolved: Yes. Denies suicidal/homicidal ideation: Yes. Issues/concerns per patient self-inventory: No.   New problem(s) identified: No, Describe:  None   New Short Term/Long Term Goal(s): medication stabilization, elimination of SI thoughts, development of comprehensive mental wellness plan.   Patient Goals: "Find long-term treatment and make the voices stop"  Discharge Plan or Barriers: Patient recently admitted. CSW will continue to follow and assess for appropriate referrals and possible discharge planning.   Reason for Continuation of Hospitalization: Anxiety Hallucinations Medication stabilization Withdrawal symptoms  Estimated Length of Stay: 3 to 7 days   Last [place] Suicide Severity Risk Score: Flowsheet Row Admission (Current) from 01/28/2022 in [place] [facility] ED from 07/18/2021 in [place] ED from 10/15/2020 in [place] No Risk No Risk No Risk       Last PHQ 2/9 Scores:     No data to display          Scribe for Treatment Team: Darleen Crocker, Latanya Presser 01/29/2022 11:14 AM

## 2022-01-29 NOTE — B[facility] Group Notes (Signed)
Adult Psychoeducational Group Note  Date:  01/29/2022 Time:  9:28 AM  Group Topic/Focus:  Goals Group:   The focus of this group is to help patients establish daily goals to achieve during treatment and discuss how the patient can incorporate goal setting into their daily lives to aide in recovery.  Participation Level:  Did Not Attend    Roger [name] 01/29/2022, 9:28 AM

## 2022-01-30 DIAGNOSIS — F1999 Other psychoactive substance use, unspecified with unspecified psychoactive substance-induced disorder: Secondary | ICD-10-CM | POA: Diagnosis not present

## 2022-01-30 LAB — CBC WITH DIFFERENTIAL/PLATELET
Abs Immature Granulocytes: 0.15 10*3/uL — ABNORMAL HIGH (ref 0.00–0.07)
Basophils Absolute: 0.1 10*3/uL (ref 0.0–0.1)
Basophils Relative: 0 %
Eosinophils Absolute: 0.5 10*3/uL (ref 0.0–0.5)
Eosinophils Relative: 3 %
HCT: 43.5 % (ref 39.0–52.0)
Hemoglobin: 14.2 g/dL (ref 13.0–17.0)
Immature Granulocytes: 1 %
Lymphocytes Relative: 27 %
Lymphs Abs: 4.2 10*3/uL — ABNORMAL HIGH (ref 0.7–4.0)
MCH: 28.6 pg (ref 26.0–34.0)
MCHC: 32.6 g/dL (ref 30.0–36.0)
MCV: 87.7 fL (ref 80.0–100.0)
Monocytes Absolute: 1.4 10*3/uL — ABNORMAL HIGH (ref 0.1–1.0)
Monocytes Relative: 9 %
Neutro Abs: 9.2 10*3/uL — ABNORMAL HIGH (ref 1.7–7.7)
Neutrophils Relative %: 60 %
Platelets: 342 10*3/uL (ref 150–400)
RBC: 4.96 MIL/uL (ref 4.22–5.81)
RDW: 13.2 % (ref 11.5–15.5)
WBC: 15.4 10*3/uL — ABNORMAL HIGH (ref 4.0–10.5)
nRBC: 0 % (ref 0.0–0.2)

## 2022-01-30 LAB — TSH: TSH: 3.525 u[IU]/mL (ref 0.350–4.500)

## 2022-01-30 LAB — VITAMIN B12: Vitamin B-12: 370 pg/mL (ref 180–914)

## 2022-01-30 LAB — VITAMIN D 25 HYDROXY (VIT D DEFICIENCY, FRACTURES): Vit D, 25-Hydroxy: 22.3 ng/mL — ABNORMAL LOW (ref 30–100)

## 2022-01-30 NOTE — Progress Notes (Signed)
Adult Psychoeducational Group Note  Date:  01/30/2022 Time:  8:59 PM  Group Topic/Focus:  Wrap-Up Group:   The focus of this group is to help patients review their daily goal of treatment and discuss progress on daily workbooks.  Participation Level:  Active  Participation Quality:  Appropriate  Affect:  Appropriate  Cognitive:  Appropriate  Insight: Appropriate  Engagement in Group:  Developing/Improving  Modes of Intervention:  Discussion  Additional Comments:  Pt stated his goal for today was to focus on his treatment plan and talk with his doctor about a medication issues. Pt stated he accomplished his goal today. Pt stated he talked with his doctor and social worker about his care today. Pt rated his overall day a 10. Pt stated he was able to contact his grandmother and a family friend today which improved his overall day. Pt stated he felt better about himself today. Pt stated he was able to attend all meals. Pt stated he took all medications provided today. Pt stated he attend all groups held today. Pt stated his appetite was pretty good today. Pt rated sleep last night was fair. Pt stated the goal tonight was to get some rest. Pt stated he had some physical pain tonight. Pt stated he had some moderate headache tonight. Pt rated his moderate headache a 6 on the pain level scale. Pt nurse was updated on the situation. Pt deny visual hallucinations and auditory issues tonight. Pt denies thoughts of harming himself or others. Pt stated he would alert staff if anything changed.  Felipa Furnace 01/30/2022, 8:59 PM

## 2022-01-30 NOTE — Progress Notes (Signed)
[facility]/BMU LCSW Progress Note   01/30/2022    1:11 PM  Roger [name]      Type of Note: [employer]   CSW spoke with Jeanice Lim and [place] informed Clinical research associate that patient paperwork looked good , she was going to start his chart and run insurance. Then states to Clinical research associate that tomorrow patient would need to call [phone], ask to speak with [facility], and participate in Pre-screening and once screening is complete, Jeanice Lim will reach out to CSW to confirm further acceptance with DC date form CSW.    Signed:   Jacob Moores, MSW, LCSWA 01/30/2022 1:11 PM

## 2022-01-30 NOTE — Progress Notes (Signed)
   01/30/22 0900  Psych Admission Type (Psych Patients Only)  Admission Status Involuntary  Psychosocial Assessment  Patient Complaints Anxiety;Depression  Eye Contact Fair  Facial Expression Flat  Affect Appropriate to circumstance  Speech Logical/coherent  Interaction Assertive  Motor Activity Other (Comment) (WNL)  Appearance/Hygiene Unremarkable  Behavior Characteristics Appropriate to situation  Mood Pleasant  Thought Process  Coherency Circumstantial  Content Preoccupation  Delusions None reported or observed  Perception Hallucinations  Hallucination Auditory  Judgment Poor  Confusion None  Danger to Self  Current suicidal ideation? Denies  Danger to Others  Danger to Others None reported or observed

## 2022-01-30 NOTE — Progress Notes (Addendum)
   01/30/22 2200  Psych Admission Type (Psych Patients Only)  Admission Status Involuntary  Psychosocial Assessment  Patient Complaints Depression  Eye Contact Fair  Facial Expression Animated  Affect Appropriate to circumstance  Speech Logical/coherent  Interaction Assertive  Motor Activity Other (Comment) (WDL)  Appearance/Hygiene Unremarkable  Behavior Characteristics Cooperative;Appropriate to situation  Mood Pleasant  Thought Process  Coherency Circumstantial  Content Preoccupation  Delusions None reported or observed  Perception Hallucinations  Hallucination Auditory ("People chattering")  Judgment Poor  Confusion None  Danger to Self  Current suicidal ideation? Denies  Danger to Others  Danger to Others None reported or observed   Patient alert and oriented. Presenting appropriate to circumstance with a pleasant mood. Patient denies SI, HI, and anxiety. Patient reports headache, describing pain as "pulsating" on the right and left temples, rated 6/10. Patient rates depression 5/10 on 0-10 scale, with 0 being the least and 10 being the worst. Patient endorses auditory hallucinations of "people chattering", and is unable to hear what the voices are saying. Scheduled medication administered to patient, per provider orders. PRN trazodone administered for sleep, per provider orders. PRN tylenol administered for pain, per provider orders. Support and encouragement provided. Routine safety checks conducted every 15 minutes. Patient verbally contracts for safety and remains safe on the unit.

## 2022-01-30 NOTE — Progress Notes (Signed)
Psychiatric Admission Assessment Adult  Patient Identification: Roger [name] MRN:  [id number] Date of Evaluation:  01/30/2022 Chief Complaint:  Substance-induced disorder [facility]) [F19.99] Principal Diagnosis: MDD (major depressive disorder), recurrent, severe, with psychosis (HCC) Diagnosis:  Principal Problem:   MDD (major depressive disorder), recurrent, severe, with psychosis (HCC) Active Problems:   Substance-induced disorder (HCC)   Insomnia   Anxiety state  History of Present Illness: Roger [name] is a 31 year old male patient with past psychiatric history of polysubstance abuse including IVDU and substance induced psychotic disorder who presented to [facility] 01/21/22 via EMS after being found unresponsive in someone's backyard with shirtless and unresponsive; owner called EMS who then gave pt Narcan after not responding then was noted to become agitated and combative requiring medications and physical restraint. Once in the unit ED patient was found with illegal substances in his rectum requiring law enforcement notification. UDS remains outstanding. BAL<10. Patient was transferred to [facility] 01/27/22 for further stabilization and treatment.   24 hour assessment: He has remained visible within the milieu and engaged in group and unit activities. Patient  continues to endorse auditory hallucinations. Vital signs remain within normal limits. Medication compliant. No PRN Tylenol for mild pain and Trazodone for insomnia received over past 24 hours. Patient noted to sleep throughout the night. Agitation protocol in place.   Assessment: Patient observed in bedroom. He is alert and oriented. Reports voices as 'the same' with some improvement; describes them as 'chatter telling me I need to get high and I'm stupid for getting help, I don't need to better myself'. Last heard last night. Endorses improved sleep. Denies any active cravings or medications side effects. Reports active participation in unit  groups and activities. He is currently denying any SI/HI/AVH or paranoia. Contracts for safety. Continues to express interest in substance abuse treatment upon discharge.   Associated Signs/Symptoms: Depression Symptoms:  difficulty concentrating, hopelessness, (Hypo) Manic Symptoms:  Hallucinations, Labiality of Mood, Anxiety Symptoms:   none noted Psychotic Symptoms:  Hallucinations: Auditory PTSD Symptoms: NA Total Time spent with patient: 45 minutes  Past Psychiatric History: polysubstance abuse, active intravenous drug use, substance induced psychotic disorder, substance induced disorder  Is the patient at risk to self? No.  Has the patient been a risk to self in the past 6 months? Yes.    Has the patient been a risk to self within the distant past? Yes.    Is the patient a risk to others? No.  Has the patient been a risk to others in the past 6 months? Yes.    Has the patient been a risk to others within the distant past? Yes.     [place] Scale:  Flowsheet Row [facility] (Current) [facility] from 07/18/2021 in [facility] EM[facility] ED from 10/15/2020 in [facility] EMERGENCY DEPARTMENT  C-SSRS RISK CATEGORY No Risk No Risk No Risk        Prior Inpatient Therapy: No. If yes, describe: none noted in chart  Prior Outpatient Therapy: No. If yes, describe. Has refused outpatient treatment   Substance Abuse Hx: Alcohol: Occasional but regular. 'I use whatever I can get my hands on'. Tobacco: Nicotine Illicit drugs: polysubstance use history including IVDU. Reports drug of choice as methamphetamines.  Rx drug abuse: when able to get   Rehab hx: Denies   Alcohol Screening: 1. How often do you have a drink containing alcohol?: 2 to 3 times a week 2.  How many drinks containing alcohol do you have on a typical day when you are drinking?: 10 or more 3. How often do you have six or more drinks on one  occasion?: Weekly AUDIT-C Score: 10 4. How often during the last year have you found that you were not able to stop drinking once you had started?: Never 5. How often during the last year have you failed to do what was normally expected from you because of drinking?: Monthly 6. How often during the last year have you needed a first drink in the morning to get yourself going after a heavy drinking session?: Monthly 7. How often during the last year have you had a feeling of guilt of remorse after drinking?: Monthly 8. How often during the last year have you been unable to remember what happened the night before because you had been drinking?: Less than monthly 9. Have you or someone else been injured as a result of your drinking?: No 10. Has a relative or friend or a doctor or another health worker been concerned about your drinking or suggested you cut down?: Yes, during the last year Alcohol Use Disorder Identification Test Final Score (AUDIT): 21 Alcohol Brief Interventions/Follow-up: Alcohol education/Brief advice Substance Abuse History in the last 12 months:  Yes.   Consequences of Substance Abuse: Medical Consequences:  'multiple' overdoses (unable to quantify) Blackouts:  'multiple' (unable to quantify) Previous Psychotropic Medications: Yes  Psychological Evaluations: Yes  Past Medical History:  Past Medical History:  Diagnosis Date   MRSA (methicillin resistant Staphylococcus aureus)    Scoliosis    History reviewed. No pertinent surgical history. Family History:  Family History  Problem Relation Age of Onset   Diabetes Mother   Past Medical History: Medical Diagnoses: MRSA in the past Home Rx: Denies  Prior Hosp: Denies  Prior Surgeries/Trauma:Denies  Head trauma, LOC, concussions, seizures: reports requiring Narcan several times; unable to recall exact number Allergies: NKA PCP: None   Family History: Medical: Diabetes; mother  Psych: Denies  Psych Rx: Denies   SA/HA:  Denies  Substance use family hx: Denies    Social History: Pt reports highest level of education as being some high school. Has 6 children; 1 girl, 5 boys. Currently homeless.   Marital Status:Single Sexual orientation: heterosexual Children: 6 Employment: unemployed Peer Group: none  Housing:homeless Finances:none Legal: hx of drug related charges Hotel manager: no  Family Psychiatric  History: not noted Tobacco Screening:  Social History   Tobacco Use  Smoking Status Every Day   Packs/day: 2.00   Years: 13.00   Total pack years: 26.00   Types: Cigarettes  Smokeless Tobacco Never    BH Tobacco Counseling     Are you interested in Tobacco Cessation Medications?  Yes, implement Nicotene Replacement Protocol Counseled patient on smoking cessation:  Refused/Declined practical counseling Reason Tobacco Screening Not Completed: No value filed.       Social History:  Social History   Substance and Sexual Activity  Alcohol Use Yes   Comment: last drink was a week ago - drinks about a fifth of liquor 1-2 times a week     Social History   Substance and Sexual Activity  Drug Use Yes   Types: Methamphetamines, Marijuana   Comment: last use for both was last week    Additional Social History: Marital status: Single Are you sexually active?: Yes What is your sexual orientation?: "I'm straight." Has your sexual activity been affected by drugs, alcohol, medication, or emotional stress?: "  No" Does patient have children?: Yes How many children?: 6 How is patient's relationship with their children?: "I don't get to see them like that. I have my oldest daughter and 5 boys."   Allergies:  No Known Allergies Lab Results:  Results for orders placed or performed during the hospital encounter of 01/28/22 (from the past 48 hour(s))  Urinalysis, Routine w reflex microscopic Urine, Clean Catch     Status: None   Collection Time: 01/29/22  3:06 PM  Result Value Ref Range    Color, Urine YELLOW YELLOW   APPearance CLEAR CLEAR   Specific Gravity, Urine 1.016 1.005 - 1.030   pH 5.0 5.0 - 8.0   Glucose, UA NEGATIVE NEGATIVE mg/dL   Hgb urine dipstick NEGATIVE NEGATIVE   Bilirubin Urine NEGATIVE NEGATIVE   Ketones, ur NEGATIVE NEGATIVE mg/dL   Protein, ur NEGATIVE NEGATIVE mg/dL   Nitrite NEGATIVE NEGATIVE   Leukocytes,Ua NEGATIVE NEGATIVE    Comment: Performed at [facility], [address], [place], [place] [address]  CBC with Differential/Platelet     Status: Abnormal   Collection Time: 01/30/22  6:32 AM  Result Value Ref Range   WBC 15.4 (H) 4.0 - 10.5 K/uL   RBC 4.96 4.22 - 5.81 MIL/uL   Hemoglobin 14.2 13.0 - 17.0 g/dL   HCT 88.5 02.7 - 74.1 %   MCV 87.7 80.0 - 100.0 fL   MCH 28.6 26.0 - 34.0 pg   MCHC 32.6 30.0 - 36.0 g/dL   RDW 28.7 86.7 - 67.2 %   Platelets 342 150 - 400 K/uL   nRBC 0.0 0.0 - 0.2 %   Neutrophils Relative % 60 %   Neutro Abs 9.2 (H) 1.7 - 7.7 K/uL   Lymphocytes Relative 27 %   Lymphs Abs 4.2 (H) 0.7 - 4.0 K/uL   Monocytes Relative 9 %   Monocytes Absolute 1.4 (H) 0.1 - 1.0 K/uL   Eosinophils Relative 3 %   Eosinophils Absolute 0.5 0.0 - 0.5 K/uL   Basophils Relative 0 %   Basophils Absolute 0.1 0.0 - 0.1 K/uL   Immature Granulocytes 1 %   Abs Immature Granulocytes 0.15 (H) 0.00 - 0.07 K/uL    Comment: Performed at [facility], [address] [place], [place] [address]  TSH     Status: None   Collection Time: 01/30/22  6:32 AM  Result Value Ref Range   TSH 3.525 0.350 - 4.500 uIU/mL    Comment: Performed by a 3rd Generation assay with a functional sensitivity of <=0.01 uIU/mL. Performed at [facility], [address] [place], [place] [address]   VITAMIN D 25 Hydroxy (Vit-D Deficiency, Fractures)     Status: Abnormal   Collection Time: 01/30/22  6:32 AM  Result Value Ref Range   Vit D, 25-Hydroxy 22.30 (L) 30 - 100 ng/mL    Comment: (NOTE) Vitamin D  deficiency has been defined by the Institute of Medicine  and an Endocrine Society practice guideline as a level of serum 25-OH  vitamin D less than 20 ng/mL (1,2). The Endocrine Society went on to  further define vitamin D insufficiency as a level between 21 and 29  ng/mL (2).  1. IOM (Institute of Medicine). 2010. Dietary reference intakes for  calcium and D. [place]: [employer]. 2. Holick MF, Binkley [place], Bischoff-Ferrari HA, et al. Evaluation,  treatment, and prevention of vitamin D deficiency: an Endocrine  Society clinical practice guideline, JCEM. 2011  Jul; 96(7): 1911-30.  Performed at [facility], [address] [place], [place] [address]   Vitamin B12     Status: None   Collection Time: 01/30/22  6:32 AM  Result Value Ref Range   Vitamin B-12 370 180 - 914 pg/mL    Comment: (NOTE) This assay is not validated for testing neonatal or myeloproliferative syndrome specimens for Vitamin B12 levels. Performed at [facility], [address] [place], [place] [address]     Blood Alcohol level:  Lab Results  Component Value Date   ETH <10 01/21/2022   ETH <10 07/18/2021    Metabolic Disorder Labs:  No results found for: "HGBA1C", "MPG" No results found for: "PROLACTIN" No results found for: "CHOL", "TRIG", "HDL", "CHOLHDL", "VLDL", "LDLCALC"  Current Medications: Current Facility-Administered Medications  Medication Dose Route Frequency Provider Last Rate Last Admin   acetaminophen (TYLENOL) tablet 650 mg  650 mg Oral Q6H PRN Leevy-Johnson, Takeo Harts A, NP   650 mg at 01/30/22 0808   alum & mag hydroxide-simeth (MAALOX/MYLANTA) 200-200-20 MG/5ML suspension 30 mL  30 mL Oral Q4H PRN Leevy-Johnson, Virda Betters A, NP       hydrOXYzine (ATARAX) tablet 25 mg  25 mg Oral TID PRN Leevy-Johnson, Nekisha Mcdiarmid A, NP   25 mg at 01/29/22 2038   risperiDONE (RISPERDAL M-TABS) disintegrating tablet 2 mg  2 mg Oral Q8H PRN Leevy-Johnson, Esiquio Boesen A, NP        And   LORazepam (ATIVAN) tablet 1 mg  1 mg Oral PRN Leevy-Johnson, Yasmyn Bellisario A, NP       And   ziprasidone (GEODON) injection 20 mg  20 mg Intramuscular PRN Leevy-Johnson, Gianna Calef A, NP       magnesium hydroxide (MILK OF MAGNESIA) suspension 30 mL  30 mL Oral Daily PRN Leevy-Johnson, Bertha Earwood A, NP       nicotine (NICODERM CQ - dosed in mg/24 hours) patch 14 mg  14 mg Transdermal Daily Onuoha, Chinwendu V, NP   14 mg at 01/29/22 0830   risperiDONE (RISPERDAL M-TABS) disintegrating tablet 3 mg  3 mg Oral QHS Nkwenti, Doris, NP       traZODone (DESYREL) tablet 50 mg  50 mg Oral QHS PRN Starleen BlueNkwenti, Doris, NP   50 mg at 01/29/22 2038   PTA Medications: No medications prior to admission.    Musculoskeletal: Strength & Muscle Tone: within normal limits Gait & Station: normal Patient leans: N/A  Psychiatric Specialty Exam:  Presentation  General Appearance:  Casual  Eye Contact: Good  Speech: Clear and Coherent  Speech Volume: Normal  Handedness: Right   Mood and Affect  Mood: Depressed  Affect: Congruent   Thought Process  Thought Processes: Coherent  Duration of Psychotic Symptoms: chronic; "off and on for a while. Months" Past Diagnosis of Schizophrenia or Psychoactive disorder: No  Descriptions of Associations:Intact  Orientation:Full (Time, Place and Person)  Thought Content:Logical  Hallucinations:Hallucinations: Auditory Description of Auditory Hallucinations: "chatter", "voices telling me i'm stupid for getting help and i need to get high instead'. Description of Visual Hallucinations: cartoon characters  Ideas of Reference:None  Suicidal Thoughts:Suicidal Thoughts: No  Homicidal Thoughts:Homicidal Thoughts: No  Sensorium  Memory: Immediate Good; Recent Good  Judgment: Fair  Insight: Fair  Chartered certified accountantxecutive Functions  Concentration: Fair  Attention Span: Fair  Recall: Fair  Fund of Knowledge: Fair  Language: Fair  Psychomotor  Activity  Psychomotor Activity: Psychomotor Activity: Normal  Assets  Assets: Manufacturing systems engineerCommunication Skills; Physical Health; Resilience; Social Support  Sleep  Sleep: Sleep: Fair  Physical Exam: Physical Exam Vitals and nursing note reviewed.  Constitutional:      Appearance: He is normal weight. He is not ill-appearing or toxic-appearing.  HENT:     Head: Normocephalic.     Nose: Nose normal.     Mouth/Throat:     Mouth: Mucous membranes are moist.     Pharynx: Oropharynx is clear.  Eyes:     Pupils: Pupils are equal, round, and reactive to light.  Cardiovascular:     Rate and Rhythm: Normal rate.     Pulses: Normal pulses.  Pulmonary:     Effort: Pulmonary effort is normal.  Abdominal:     Palpations: Abdomen is soft.  Musculoskeletal:        General: Normal range of motion.     Cervical back: Normal range of motion.  Skin:    General: Skin is warm and dry.  Neurological:     Mental Status: He is alert and oriented to person, place, and time.  Psychiatric:        Attention and Perception: Attention normal. He perceives auditory hallucinations. He does not perceive visual hallucinations.        Mood and Affect: Affect is blunt.        Speech: Speech normal.        Behavior: Behavior is cooperative.        Thought Content: Thought content normal. Thought content is not paranoid or delusional. Thought content does not include homicidal or suicidal ideation. Thought content does not include homicidal or suicidal plan.        Cognition and Memory: Cognition and memory normal.        Judgment: Judgment normal.    Review of Systems  Psychiatric/Behavioral:  Positive for hallucinations and substance abuse.   All other systems reviewed and are negative.  Blood pressure (!) 120/93, pulse 99, temperature 98.2 F (36.8 C), temperature source Oral, resp. rate 16, height  (1.651 m), weight 74.4 kg, SpO2 100 %. Body mass index is 27.29 kg/m.  Recent medication trials:  -  Olanzapine 5 mg started in ED; discontinued due to pt reporting increased auditory hallucinations - Gabapentin 100 mg TID in ED; discontinued in Ed - Risperidone 2 mg increased to 3 mg 01/29/22  Treatment Plan Summary: Daily contact with patient to assess and evaluate symptoms and progress in treatment, Medication management, and Plan   PLAN:  Medications:  Substance induced disorder - Continue:  - Risperidone M-Tabs 3 mg daily  Smoking cessation:  - Nicotine patch 14 mg  transdermal daily  PRNs: - Acetaminophen 650 mg q6hrs  PRN: mild pain  - Maalox/Mylanta 200-200-20 mg  q4 hrs PRN: indigestion  - Magnesium Hydroxide 30 mL  daily PRN: mild  constipation  Observation Level/Precautions:  15 minute checks  Laboratory:  CBC Chemistry Profile HbAIC UDS UA  Psychotherapy:    Medications:    Consultations:    Discharge Concerns:    Estimated LOS:  Other:     Physician Treatment Plan for Primary Diagnosis: MDD (major depressive disorder), recurrent, severe, with psychosis (HCC) Long Term Goal(s): Improvement in symptoms so as ready for discharge  Short Term Goals: Ability to identify changes in lifestyle to reduce recurrence of condition will improve, Ability to verbalize feelings will improve, Ability to disclose and discuss suicidal ideas, Ability to demonstrate self-control will improve, Ability to identify and develop effective coping behaviors will improve, Ability to maintain clinical measurements within normal limits will  improve, Compliance with prescribed medications will improve, and Ability to identify triggers associated with substance abuse/mental health issues will improve  Physician Treatment Plan for Secondary Diagnosis: Principal Problem:   MDD (major depressive disorder), recurrent, severe, with psychosis (HCC) Active Problems:   Substance-induced disorder (HCC)   Insomnia   Anxiety state  Long Term Goal(s): Improvement in symptoms so as ready for  discharge  Short Term Goals: Ability to identify changes in lifestyle to reduce recurrence of condition will improve, Ability to verbalize feelings will improve, Ability to disclose and discuss suicidal ideas, Ability to demonstrate self-control will improve, Ability to identify and develop effective coping behaviors will improve, Ability to maintain clinical measurements within normal limits will improve, Compliance with prescribed medications will improve, and Ability to identify triggers associated with substance abuse/mental health issues will improve  I certify that inpatient services furnished can reasonably be expected to improve the patient's condition.    Loletta Parish, NP 12/28/20232:41 PMPatient ID: KATE SWEETMAN, male   DOB: 1990/04/25, 31 y.o.   MRN: [id number]

## 2022-01-30 NOTE — Group Note (Signed)
Recreation Therapy Group Note   Group Topic:Leisure Education  Group Date: 01/30/2022 Start Time: 1000 End Time: 1050 Facilitators: Barney Gertsch-McCall, LRT,CTRS Location: [address] Dayroom   Goal Area(s) Addresses:  Patient will successfully identify positive leisure and recreation activities.  Patient will acknowlege benefits of participation in healthy leisure activities post discharge.    Group Description: What is Leisure?  LRT and patients discussed leisure (what it is and it's purpose).  LRT also discussed with patients how leisure can be used in different areas of their lives.  Patients were then give two worksheets.  One the first sheet, patients were to identify a leisure activity for each benefit described.  On the third sheet, patients were to identify three leisure activities for each category (ex. Improvement, pleasure, escape, fitness, socialization) provided they could do in their leisure time.  Patients would then share three answers from the first sheet and two categories from the second.   Affect/Mood: N/A   Participation Level: Did not attend    Clinical Observations/Individualized Feedback:      Plan: Continue to engage patient in RT group sessions 2-3x/week.   Hyatt Capobianco-McCall, LRT,CTRS 01/30/2022 12:20 PM

## 2022-01-30 NOTE — Progress Notes (Signed)
   01/29/22 2000  Psych Admission Type (Psych Patients Only)  Admission Status Involuntary  Psychosocial Assessment  Patient Complaints Anxiety  Eye Contact Fair  Facial Expression Animated  Affect Appropriate to circumstance  Speech Logical/coherent  Interaction Assertive  Motor Activity Other (Comment) (WNL)  Appearance/Hygiene Unremarkable  Behavior Characteristics Appropriate to situation  Mood Pleasant  Thought Process  Coherency Circumstantial  Content Preoccupation  Delusions None reported or observed  Perception Hallucinations  Hallucination Auditory  Judgment Poor  Confusion None  Danger to Self  Current suicidal ideation? Denies  Danger to Others  Danger to Others None reported or observed

## 2022-01-30 NOTE — Plan of Care (Signed)
  Problem: Coping: Goal: Ability to verbalize frustrations and anger appropriately will improve Outcome: Progressing   Problem: Coping: Goal: Ability to demonstrate self-control will improve Outcome: Progressing   Problem: Safety: Goal: Periods of time without injury will increase Outcome: Progressing   

## 2022-01-31 DIAGNOSIS — F333 Major depressive disorder, recurrent, severe with psychotic symptoms: Secondary | ICD-10-CM | POA: Diagnosis not present

## 2022-01-31 LAB — HEMOGLOBIN A1C
Hgb A1c MFr Bld: 5.9 % — ABNORMAL HIGH (ref 4.8–5.6)
Mean Plasma Glucose: 123 mg/dL

## 2022-01-31 MED ORDER — SUMATRIPTAN SUCCINATE 25 MG PO TABS
25.0000 mg | ORAL_TABLET | Freq: Four times a day (QID) | ORAL | Status: DC | PRN
  Administered 2022-01-31: 25 mg via ORAL
  Filled 2022-01-31: qty 1

## 2022-01-31 MED ORDER — RISPERIDONE 1 MG PO TBDP
1.0000 mg | ORAL_TABLET | Freq: Every day | ORAL | Status: DC
  Administered 2022-02-01 – 2022-02-04 (×4): 1 mg via ORAL
  Filled 2022-01-31 (×5): qty 1

## 2022-01-31 NOTE — Progress Notes (Signed)
Adult Psychoeducational Group Note  Date:  01/31/2022 Time:  8:31 PM  Group Topic/Focus:  Wrap-Up Group:   The focus of this group is to help patients review their daily goal of treatment and discuss progress on daily workbooks.  Participation Level:  Active  Participation Quality:  Appropriate  Affect:  Appropriate  Cognitive:  Appropriate  Insight: Appropriate  Engagement in Group:  Engaged  Modes of Intervention:  Discussion  Additional Comments:   Pt states that he had a good day and received good news about his D/C date today. Pt also is excited to be going to the treatment facility he will be staying for recovery processing. Pt is hopeful  that he can spend time with his family before he goes away to the facility. Pt states he learned a lot from his time here and is optimistic about his departure. Pt denies everything.  Vevelyn Pat 01/31/2022, 8:31 PM

## 2022-01-31 NOTE — Progress Notes (Signed)
Patient did not attend morning orientation/goal setting group 

## 2022-01-31 NOTE — Group Note (Signed)
Recreation Therapy Group Note   Group Topic:Stress Management  Group Date: 01/31/2022 Start Time: 1000 End Time: 1025 Facilitators: Aldean Suddeth-McCall, LRT,CTRS Location: 500 Hall Dayroom   Goal Area(s) Addresses:  Patient will identify positive stress management techniques. Patient will identify benefits of using stress management post d/c.  Group Description:  Meditation.  LRT played a meditation that focused on being grateful for the person in your life who has been supportive or made them feel good in tough times.  Patients were to it back, focus on their breathing and listen to the meditation as it played to engage in the meditation.   Affect/Mood: N/A   Participation Level: Did not attend    Clinical Observations/Individualized Feedback:     Plan: Continue to engage patient in RT group sessions 2-3x/week.   Krisa Blattner-McCall, LRT,CTRS 01/31/2022 12:58 PM

## 2022-01-31 NOTE — Progress Notes (Signed)
[facility] LCSW Progress Note   01/31/2022    1:24 PM  Earnie Larsson      Type of Note: [employer] From [place]  CSW spoke with Romeo Apple the Orthoptist at [facility] treatment center. Ben informed CSW that patient insurance runs out at the end of this month and patient would need to renew insurance. Once patient is able to renew, Romeo Apple stated that everything else will be good for him to be accepted. CSW spoke with patient regarding this matter and provided him with his insurance contact information, to call and get insurance renewed.     Signed:   Jacob Moores, MSW, LCSWA 01/31/2022 1:24 PM

## 2022-01-31 NOTE — Progress Notes (Signed)
[facility] LCSW Progress Note   01/31/2022    3:52 PM  Roger [name]      Type of Note: [place] Follow Up    CSW followed up with patient regarding his insurance renewal and patient confirmed that his grandmother had already taken care of it. CSW reached back out to [facility] and spoke with Richardson Dopp to get further update. Richardson Dopp stated that patient had one more screening to do and then on 12/31 for CSW to follow up with [facility] about admission date. CSW also informed Richardson Dopp that patient would need transportation and her stated that he could put request in after patient complete second screening. Patient was given further info with contact number to complete second screening so he can get his confirmed admission date on 12/31.    Signed:   Jacob Moores, MSW, [facility] 01/31/2022 3:52 PM

## 2022-01-31 NOTE — Progress Notes (Signed)
   01/31/22 2108  Psych Admission Type (Psych Patients Only)  Admission Status Involuntary  Psychosocial Assessment  Patient Complaints None  Eye Contact Fair  Facial Expression Animated  Affect Appropriate to circumstance  Speech Logical/coherent  Interaction Assertive  Motor Activity Other (Comment) (WDL)  Appearance/Hygiene Unremarkable  Behavior Characteristics Cooperative;Appropriate to situation  Mood Pleasant  Thought Process  Coherency Circumstantial  Content Preoccupation  Delusions None reported or observed  Perception Hallucinations  Hallucination Auditory  Judgment Poor  Confusion None  Danger to Self  Current suicidal ideation? Denies  Danger to Others  Danger to Others None reported or observed   Patient alert and oriented. Presenting appropriate to circumstance with a pleasant mood. Patient denies SI, HI, and AVH. Patient reports he has had a good day and is looking forward to discharge to treatment center Tuesday. Patient states he has a goal of smoking cessation for the new year.  Support and encouragement provided.  Scheduled medication administered to patient , per provider orders. Patient reports headache rated 4/10, PRN imitrex administered, per provider orders. PRN trazodone administered for sleep disturbance, per provider orders. Routine safety checks conducted every 15 minutes. Patient verbally contracts for safety and remains safe on the unit.

## 2022-01-31 NOTE — Progress Notes (Signed)
[facility] MD Progress Note  01/31/2022 6:23 PM Roger [name]  MRN:  [id number] Principal Problem: MDD (major depressive disorder), recurrent, severe, with psychosis (HCC) Diagnosis: Principal Problem:   MDD (major depressive disorder), recurrent, severe, with psychosis (HCC) Active Problems:   Substance-induced disorder (HCC)   Insomnia   Anxiety state  Reason For Admission:  Roger [name] is a 31 year old male patient with past psychiatric history of polysubstance abuse including IVDU and substance induced psychotic disorder who presented to [facility] 01/21/22 via EMS after being found unresponsive in someone's backyard with shirtless and unresponsive; owner called EMS who then gave pt Narcan after not responding then was noted to become agitated and combative requiring medications and physical restraint. Once in the unit ED patient was found with illegal substances in his rectum requiring law enforcement notification. Patient was transferred to [facility] 01/27/22 for further stabilization and treatment.    24 hr chart review: V/S over the past 24 hrs mostly WNL, with the exception of today afternoon which was elevated slightly at 142/91. Pt is compliant with scheduled medications as per nursing documentation. As per nursing documentation, pt has not been attending unit group sessions.  Patient assessment note, 01/31/22: Pt with depressed mood, but slightly improved since admission. His attention to personal hygiene and grooming is fair, eye contact is good, speech is clear & coherent. Thought contents are organized and logical, and pt currently denies SI/HI. He reports +AH of "chatters" and voices laughing at him. He denies VH, and states that his paranoia is resolving. He reports that the [facility] are same as at admission and that there has been no improvement. He reports migraines which have led to hikm being mostly in his room so that the noise does not make them worse. He states that Tylenol has been minimally  helpful.  Pt reports that his sleep quality last night was good with Trazodone. He reports a good appetite.  He denies any medication related side effects. No TD/EPS type symptoms found on assessment, and pt denies any feelings of stiffness. AIMS: 0. We are increasing Risperdal to 1 mg in the mornings and keeping the 3 mg nightly for psychosis. Imitrex 2 mg PRN Q 6 H ordered for migraines. We are keeping other medications as listed below.  Total Time spent with patient: 45 minutes  Past Psychiatric History: Polysubstance abuse, MDD  Past Medical History:  Past Medical History:  Diagnosis Date   MRSA (methicillin resistant Staphylococcus aureus)    Scoliosis    History reviewed. No pertinent surgical history. Family History:  Family History  Problem Relation Age of Onset   Diabetes Mother    Family Psychiatric  History: non reported Social History:  Social History   Substance and Sexual Activity  Alcohol Use Yes   Comment: last drink was a week ago - drinks about a fifth of liquor 1-2 times a week     Social History   Substance and Sexual Activity  Drug Use Yes   Types: Methamphetamines, Marijuana   Comment: last use for both was last week    Social History   Socioeconomic History   Marital status: Single    Spouse name: Not on file   Number of children: Not on file   Years of education: Not on file   Highest education level: Not on file  Occupational History   Not on file  Tobacco Use   Smoking status: Every Day    Packs/day: 2.00    Years:  13.00    Total pack years: 26.00    Types: Cigarettes   Smokeless tobacco: Never  Vaping Use   Vaping Use: Never used  Substance and Sexual Activity   Alcohol use: Yes    Comment: last drink was a week ago - drinks about a fifth of liquor 1-2 times a week   Drug use: Yes    Types: Methamphetamines, Marijuana    Comment: last use for both was last week   Sexual activity: Yes    Birth control/protection: None  Other  Topics Concern   Not on file  Social History Narrative   Not on file   Social Determinants of Health   Financial Resource Strain: Not on file  Food Insecurity: Food Insecurity Present (01/28/2022)   Hunger Vital Sign    Worried About Running Out of Food in the Last Year: Sometimes true    Ran Out of Food in the Last Year: Sometimes true  Transportation Needs: Unmet Transportation Needs (01/28/2022)   PRAPARE - Administrator, Civil ServiceTransportation    Lack of Transportation (Medical): Yes    Lack of Transportation (Non-Medical): Yes  Physical Activity: Not on file  Stress: Not on file  Social Connections: Not on file   Additional Social History:    Sleep: Poor  Appetite:  Good  Current Medications: Current Facility-Administered Medications  Medication Dose Route Frequency Provider Last Rate Last Admin   acetaminophen (TYLENOL) tablet 650 mg  650 mg Oral Q6H PRN Leevy-Johnson, Brooke A, NP   650 mg at 01/31/22 1442   alum & mag hydroxide-simeth (MAALOX/MYLANTA) 200-200-20 MG/5ML suspension 30 mL  30 mL Oral Q4H PRN Leevy-Johnson, Brooke A, NP       hydrOXYzine (ATARAX) tablet 25 mg  25 mg Oral TID PRN Leevy-Johnson, Brooke A, NP   25 mg at 01/29/22 2038   risperiDONE (RISPERDAL M-TABS) disintegrating tablet 2 mg  2 mg Oral Q8H PRN Leevy-Johnson, Brooke A, NP       And   LORazepam (ATIVAN) tablet 1 mg  1 mg Oral PRN Leevy-Johnson, Brooke A, NP       And   ziprasidone (GEODON) injection 20 mg  20 mg Intramuscular PRN Leevy-Johnson, Brooke A, NP       magnesium hydroxide (MILK OF MAGNESIA) suspension 30 mL  30 mL Oral Daily PRN Leevy-Johnson, Brooke A, NP       nicotine (NICODERM CQ - dosed in mg/24 hours) patch 14 mg  14 mg Transdermal Daily Onuoha, Chinwendu V, NP   14 mg at 01/29/22 0830   [START ON 02/01/2022] risperiDONE (RISPERDAL M-TABS) disintegrating tablet 1 mg  1 mg Oral Daily Hosey Burmester, NP       risperiDONE (RISPERDAL M-TABS) disintegrating tablet 3 mg  3 mg Oral QHS Briellah Baik, NP    3 mg at 01/30/22 2050   SUMAtriptan (IMITREX) tablet 25 mg  25 mg Oral Q6H PRN Starleen BlueNkwenti, Barrie Sigmund, NP       traZODone (DESYREL) tablet 50 mg  50 mg Oral QHS PRN Starleen BlueNkwenti, Gladyes Kudo, NP   50 mg at 01/30/22 2127    Lab Results:  Results for orders placed or performed during the hospital encounter of 01/28/22 (from the past 48 hour(s))  CBC with Differential/Platelet     Status: Abnormal   Collection Time: 01/30/22  6:32 AM  Result Value Ref Range   WBC 15.4 (H) 4.0 - 10.5 K/uL   RBC 4.96 4.22 - 5.81 MIL/uL   Hemoglobin 14.2 13.0 - 17.0 g/dL  HCT 43.5 39.0 - 52.0 %   MCV 87.7 80.0 - 100.0 fL   MCH 28.6 26.0 - 34.0 pg   MCHC 32.6 30.0 - 36.0 g/dL   RDW 32.4 40.1 - 02.7 %   Platelets 342 150 - 400 K/uL   nRBC 0.0 0.0 - 0.2 %   Neutrophils Relative % 60 %   Neutro Abs 9.2 (H) 1.7 - 7.7 K/uL   Lymphocytes Relative 27 %   Lymphs Abs 4.2 (H) 0.7 - 4.0 K/uL   Monocytes Relative 9 %   Monocytes Absolute 1.4 (H) 0.1 - 1.0 K/uL   Eosinophils Relative 3 %   Eosinophils Absolute 0.5 0.0 - 0.5 K/uL   Basophils Relative 0 %   Basophils Absolute 0.1 0.0 - 0.1 K/uL   Immature Granulocytes 1 %   Abs Immature Granulocytes 0.15 (H) 0.00 - 0.07 K/uL    Comment: Performed at [facility], [address] [place], [place] [address]  TSH     Status: None   Collection Time: 01/30/22  6:32 AM  Result Value Ref Range   TSH 3.525 0.350 - 4.500 uIU/mL    Comment: Performed by a 3rd Generation assay with a functional sensitivity of <=0.01 uIU/mL. Performed at [facility], [address], [place], [place] [address]   Hemoglobin A1c     Status: Abnormal   Collection Time: 01/30/22  6:32 AM  Result Value Ref Range   Hgb A1c MFr Bld 5.9 (H) 4.8 - 5.6 %    Comment: (NOTE)         Prediabetes: 5.7 - 6.4         Diabetes: >6.4         Glycemic control for adults with diabetes: <7.0    Mean Plasma Glucose 123 mg/dL    Comment: (NOTE) Performed At: [facility] [address] [place], [place] [id number] Jolene Schimke MD VF:[id number]   VITAMIN D 25 Hydroxy (Vit-D Deficiency, Fractures)     Status: Abnormal   Collection Time: 01/30/22  6:32 AM  Result Value Ref Range   Vit D, 25-Hydroxy 22.30 (L) 30 - 100 ng/mL    Comment: (NOTE) Vitamin D deficiency has been defined by the Institute of Medicine  and an Endocrine Society practice guideline as a level of serum 25-OH  vitamin D less than 20 ng/mL (1,2). The Endocrine Society went on to  further define vitamin D insufficiency as a level between 21 and 29  ng/mL (2).  1. IOM (Institute of Medicine). 2010. Dietary reference intakes for  calcium and D. [place]: [employer]. 2. Holick MF, Binkley , Bischoff-Ferrari HA, et al. Evaluation,  treatment, and prevention of vitamin D deficiency: an Endocrine  Society clinical practice guideline, JCEM. 2011 Jul; 96(7): 1911-30.  Performed at [facility], [address], [place], [place] [address]   Vitamin B12     Status: None   Collection Time: 01/30/22  6:32 AM  Result Value Ref Range   Vitamin B-12 370 180 - 914 pg/mL    Comment: (NOTE) This assay is not validated for testing neonatal or myeloproliferative syndrome specimens for Vitamin B12 levels. Performed at [facility], [address] [place], [place] [address]     Blood Alcohol level:  Lab Results  Component Value Date   [facility] <10 01/21/2022   ETH <10 07/18/2021    Metabolic Disorder Labs: Lab Results  Component Value Date   HGBA1C  5.9 (H) 01/30/2022   MPG 123 01/30/2022   No results found for: "PROLACTIN" No results found for: "CHOL", "TRIG", "HDL", "CHOLHDL", "VLDL", "LDLCALC"  Physical Findings: AIMS: 0 CIWA: n/a COWS: n/a  Musculoskeletal: Strength & Muscle Tone: within normal limits Gait & Station: normal Patient leans: N/A  Psychiatric Specialty Exam:  Presentation  General Appearance:  Fairly Groomed  Eye  Contact: Good  Speech: Clear and Coherent  Speech Volume: Normal  Handedness: Right  Mood and Affect  Mood: Depressed  Affect: Congruent  Thought Process  Thought Processes: Coherent  Descriptions of Associations:Intact  Orientation:Full (Time, Place and Person)  Thought Content:Logical  History of Schizophrenia/Schizoaffective disorder:No  Duration of Psychotic Symptoms:Greater than six months  Hallucinations:Hallucinations: Auditory Description of Auditory Hallucinations: "chatter", "voices telling me i'm stupid for getting help and i need to get high instead'.  Ideas of Reference:Paranoia  Suicidal Thoughts:Suicidal Thoughts: No  Homicidal Thoughts:Homicidal Thoughts: No  Sensorium  Memory: Immediate Good  Judgment: Fair  Insight: Fair  Chartered certified accountant: Fair  Attention Span: Fair  Recall: Fair  Fund of Knowledge: Fair  Language: Fair   Psychomotor Activity  Psychomotor Activity: Psychomotor Activity: Normal  Assets  Assets: Manufacturing systems engineer; Housing; Social Support  Sleep  Sleep: Sleep: Good  Physical Exam: Physical Exam Review of Systems  Constitutional:  Negative for fever.  HENT: Negative.    Eyes:  Negative for blurred vision.  Respiratory: Negative.    Cardiovascular: Negative.  Negative for chest pain.  Gastrointestinal: Negative.   Genitourinary: Negative.   Musculoskeletal: Negative.   Skin: Negative.   Neurological: Negative.   Psychiatric/Behavioral:  Positive for depression, hallucinations and substance abuse. Negative for memory loss and suicidal ideas. The patient is nervous/anxious and has insomnia.    Blood pressure (!) 142/91, pulse 76, temperature 98.3 F (36.8 C), temperature source Oral, resp. rate 16, height 5\' 5"  (1.651 m), weight 74.4 kg, SpO2 100 %. Body mass index is 27.29 kg/m.  Treatment Plan Summary: Daily contact with patient to assess and evaluate symptoms and  progress in treatment and Medication management  Observation Level/Precautions:  15 minute checks  Laboratory:  Labs reviewed   Psychotherapy:  Unit Group sessions  Medications:  See [facility]  Consultations:  To be determined   Discharge Concerns:  Safety, medication compliance, mood stability  Estimated LOS: 5-7 days  Other:  N/A   Labs independently reviewed on 12/27: Repeating CBC due to elevated WBC of 15.4 on 12/21. Tox screen +THC. CMP reviewed. Ordering HA1C, TSH, Vits D, B1, B12, baseline UA and repeat EKG for Qtc trending while on antipsychotics.  PLAN Safety and Monitoring: Voluntary admission to inpatient psychiatric unit for safety, stabilization and treatment Daily contact with patient to assess and evaluate symptoms and progress in treatment Patient's case to be discussed in multi-disciplinary team meeting Observation Level : q15 minute checks Vital signs: q12 hours Precautions: safety  Long Term Goal(s): Improvement in symptoms so as ready for discharge  Short Term Goals: Ability to identify changes in lifestyle to reduce recurrence of condition will improve, Ability to verbalize feelings will improve, Ability to disclose and discuss suicidal ideas, Ability to identify and develop effective coping behaviors will improve, and Ability to identify triggers associated with substance abuse/mental health issues will improve  Diagnoses  Principal Problem:   MDD (major depressive disorder), recurrent, severe, with psychosis (HCC) Active Problems:   Substance-induced disorder (HCC)   Insomnia   Anxiety state  Medications:  -Increase Risperdal to 1 mg  nightly and keep 3 mg nightly for psychosis starting today. -Start Imitrex PRN Q 6 H for migraines -Continue Trazodone 50 mg nightly PRN for insomnia -Continue Hydroxyzine 25 mg every 6 hours PRN -Continue Nicotine patch for nicotine dependence  Other PRNS -Continue Tylenol 650 mg every 6 hours PRN for mild pain -Continue  Maalox 30 mg every 4 hrs PRN for indigestion -Continue Milk of Magnesia as needed every 6 hrs for constipation  Discharge Planning: Social work and case management to assist with discharge planning and identification of hospital follow-up needs prior to discharge Estimated LOS: 5-7 days Discharge Concerns: Need to establish a safety plan; Medication compliance and effectiveness Discharge Goals: Return home with outpatient referrals for mental health follow-up including medication management/psychotherapy  I certify that inpatient services furnished can reasonably be expected to improve the patient's condition.    Starleen Blue, NP 12/29/20236:23 PM  Patient ID: Roger [name], male   DOB: 06/21/1990, 31 y.o.   MRN: [id number]

## 2022-01-31 NOTE — Group Note (Signed)
LCSW Group Therapy Note   Group Date: 01/31/2022 Start Time: 1100 End Time: 1200   Type of Therapy and Topic:  Group Therapy: Boundaries  Participation Level:  Active  Description of Group: This group will address the use of boundaries in their personal lives. Patients will explore why boundaries are important, the difference between healthy and unhealthy boundaries, and negative and postive outcomes of different boundaries and will look at how boundaries can be crossed.  Patients will be encouraged to identify current boundaries in their own lives and identify what kind of boundary is being set. Facilitators will guide patients in utilizing problem-solving interventions to address and correct types boundaries being used and to address when no boundary is being used. Understanding and applying boundaries will be explored and addressed for obtaining and maintaining a balanced life. Patients will be encouraged to explore ways to assertively make their boundaries and needs known to significant others in their lives, using other group members and facilitator for role play, support, and feedback.  Therapeutic Goals:  1.  Patient will identify areas in their life where setting clear boundaries could be  used to improve their life.  2.  Patient will identify signs/triggers that a boundary is not being respected. 3.  Patient will identify two ways to set boundaries in order to achieve balance in  their lives: 4.  Patient will demonstrate ability to communicate their needs and set boundaries  through discussion and/or role plays  Summary of Patient Progress:  Roger [name] was present/active throughout the session and proved open to feedback from CSW and peers. Patient demonstrated good insight into the subject matter, was respectful of peers, and was present throughout the entire session.  Therapeutic Modalities:   Cognitive Behavioral Therapy Solution-Focused Therapy  Beatris Si,  LCSW 01/31/2022  1:46 PM

## 2022-01-31 NOTE — Progress Notes (Signed)
   01/31/22 0606  15 Minute Checks  Location Bedroom  Visual Appearance Calm  Behavior Composed  Sleep (Behavioral Health Patients Only)  Calculate sleep? (Click Yes once per 24 hr at 0600 safety check) Yes  Documented sleep last 24 hours 7.25

## 2022-01-31 NOTE — Progress Notes (Signed)
   01/31/22 0900  Psych Admission Type (Psych Patients Only)  Admission Status Involuntary  Psychosocial Assessment  Patient Complaints Depression  Eye Contact Fair  Facial Expression Animated  Affect Appropriate to circumstance  Speech Logical/coherent  Interaction Assertive  Motor Activity Other (Comment) (WNL)  Appearance/Hygiene Unremarkable  Behavior Characteristics Cooperative;Appropriate to situation  Mood Pleasant  Thought Process  Coherency Circumstantial  Content Preoccupation  Delusions None reported or observed  Perception Hallucinations  Hallucination Auditory  Judgment Poor  Confusion None  Danger to Self  Current suicidal ideation? Denies  Danger to Others  Danger to Others None reported or observed

## 2022-02-01 DIAGNOSIS — F411 Generalized anxiety disorder: Secondary | ICD-10-CM

## 2022-02-01 DIAGNOSIS — F333 Major depressive disorder, recurrent, severe with psychotic symptoms: Secondary | ICD-10-CM | POA: Diagnosis not present

## 2022-02-01 DIAGNOSIS — G4700 Insomnia, unspecified: Secondary | ICD-10-CM

## 2022-02-01 DIAGNOSIS — F1910 Other psychoactive substance abuse, uncomplicated: Secondary | ICD-10-CM

## 2022-02-01 NOTE — Progress Notes (Signed)
Patient did not attend morning group.  

## 2022-02-01 NOTE — Progress Notes (Signed)
   02/01/22 0557  15 Minute Checks  Location Bedroom  Visual Appearance Calm  Behavior Composed  Sleep (Behavioral Health Patients Only)  Calculate sleep? (Click Yes once per 24 hr at 0600 safety check) Yes  Documented sleep last 24 hours 6.5

## 2022-02-01 NOTE — Progress Notes (Signed)
   02/01/22 0900  Psych Admission Type (Psych Patients Only)  Admission Status Involuntary  Psychosocial Assessment  Patient Complaints None  Eye Contact Fair  Facial Expression Animated  Affect Appropriate to circumstance  Speech Logical/coherent  Interaction Assertive  Motor Activity Other (Comment) (WNL)  Appearance/Hygiene Unremarkable  Behavior Characteristics Cooperative;Appropriate to situation  Mood Pleasant  Thought Process  Coherency Circumstantial  Content Preoccupation  Delusions None reported or observed  Perception Hallucinations  Hallucination Auditory  Judgment Poor  Confusion None  Danger to Self  Current suicidal ideation? Denies  Danger to Others  Danger to Others None reported or observed

## 2022-02-01 NOTE — Group Note (Signed)
[facility] LCSW Group Therapy Note  Date:  02/01/2022   Type of Therapy and Topic:  Group Therapy:  Focus for the New Year  Participation Level:  Did Not Attend   Description of Group:  The focus of this group was to provide patients with an opportunity to think about and discuss what they can work on this coming year that will result in them being happier and healthier one year from today.  It was reviewed how "new year's resolutions" often fade in importance within a few days, so patients were encouraged to think in a broader, more impactful way about what they wish to focus on to change their lives.  Therapeutic Goals Patients discussed in general the benefit of having goals to work on Patients described their own personal goals/focus for the next year that will enable them to be happier and healthier Patients received encouragement from each other and CSW Patients provided support and ideas to each other  Summary of Patient Progress: N/A   Therapeutic Modalities Processing   Roger Pe Grossman-Orr, LCSW    

## 2022-02-01 NOTE — [facility] Group Notes (Signed)
Goals Group 02/01/22   Group Focus: affirmation, clarity of thought, and goals/reality orientation Treatment Modality:  Psychoeducation Interventions utilized were assignment, group exercise, and support Purpose: To be able to understand and verbalize the reason for their admission to the hospital. To understand that the medication helps with their chemical imbalance but they also need to work on their choices in life. To be challenged to develop a list of 30 positives about themselves. Also introduce the concept that "feelings" are not reality.  Participation Level:  Active  Participation Quality:  Appropriate  Affect:  Appropriate  Cognitive:  Appropriate  Insight:  Improving  Engagement in Group:  Engaged  Additional Comments:  .Rates his energy at a 10/10. Accomplished a list of 20 positive attributes about himself which was well done. 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

## 2022-02-01 NOTE — Progress Notes (Signed)
[facility] MD Progress Note  02/01/2022 2:59 PM Roger Roger [name]  MRN:  [id number] Principal Problem: MDD (major depressive disorder), recurrent, severe, with psychosis (HCC) Diagnosis: Principal Problem:   MDD (major depressive disorder), recurrent, severe, with psychosis (HCC) Active Problems:   Substance-induced disorder (HCC)   Insomnia   Anxiety state  Reason For Admission:  Roger Roger [name] is Roger [name] 31 year old male patient with past psychiatric history of polysubstance abuse including IVDU and substance induced psychotic disorder who presented to [facility] 01/21/22 via EMS after being found unresponsive in someone's backyard with shirtless and unresponsive; owner called EMS who then gave pt Narcan after not responding then was noted to become agitated and combative requiring medications and physical restraint. Once in the unit ED patient was found with illegal substances in his rectum requiring law enforcement notification. Patient was transferred to [facility] 01/27/22 for further stabilization and treatment.    24 hr chart review: Roger [name]/S over the past 24 hrs mostly WNL, with the exception of today afternoon which was elevated slightly at 142/91. Pt is compliant with scheduled medications as per nursing documentation. As per nursing documentation, pt has not been attending unit group sessions.   Patient assessment note, 02/01/22: Pt with Roger [name] less depressed mood, affect is congruent. His attention to personal hygiene and grooming is fair, eye contact is good, speech is clear & coherent. Thought contents are organized and logical, and pt currently denies SI/HI/AVH or paranoia. There is no evidence of delusional thoughts.    Pt reports that his sleep quality last night was good with Trazodone. He reports Roger [name] good appetite.  He denies any medication related side effects. No TD/EPS type symptoms found on assessment, and pt denies any feelings of stiffness. AIMS: 0.  Patient reports that he spoke to [facility] treatment center  today, and that he has been accepted into the program.  He reports wanting to go there, but states that he prefers for his family to take him there. Pt has been educated on the fact that it is preferable for him to leave from this [facility] and go to his treatment facility without going home so as to prevent, relapse. CSW has been notified to call treatment facility to ascertain if pt has been admitted there. Medications are being continued as listed below with no changes today. Symptoms seem to be resolving.   Total Time spent with patient: 45 minutes  Past Psychiatric History: Polysubstance abuse, MDD  Past Medical History:  Past Medical History:  Diagnosis Date   MRSA (methicillin resistant Staphylococcus aureus)    Scoliosis    History reviewed. No pertinent surgical history. Family History:  Family History  Problem Relation Age of Onset   Diabetes Mother    Family Psychiatric  History: non reported Social History:  Social History   Substance and Sexual Activity  Alcohol Use Yes   Comment: last drink was Roger [name] week ago - drinks about Roger [name] fifth of liquor 1-2 times Roger [name] week     Social History   Substance and Sexual Activity  Drug Use Yes   Types: Methamphetamines, Marijuana   Comment: last use for both was last week    Social History   Socioeconomic History   Marital status: Single    Spouse name: Not on file   Number of children: Not on file   Years of education: Not on file   Highest education level: Not on file  Occupational History   Not on file  Tobacco Use  Smoking status: Every Day    Packs/day: 2.00    Years: 13.00    Total pack years: 26.00    Types: Cigarettes   Smokeless tobacco: Never  Vaping Use   Vaping Use: Never used  Substance and Sexual Activity   Alcohol use: Yes    Comment: last drink was Roger [name] week ago - drinks about Roger [name] fifth of liquor 1-2 times Roger [name] week   Drug use: Yes    Types: Methamphetamines, Marijuana    Comment: last use for both was last week    Sexual activity: Yes    Birth control/protection: None  Other Topics Concern   Not on file  Social History Narrative   Not on file   Social Determinants of Health   Financial Resource Strain: Not on file  Food Insecurity: Food Insecurity Present (01/28/2022)   Hunger Vital Sign    Worried About Running Out of Food in the Last Year: Sometimes true    Ran Out of Food in the Last Year: Sometimes true  Transportation Needs: Unmet Transportation Needs (01/28/2022)   PRAPARE - Administrator, Civil Service (Medical): Yes    Lack of Transportation (Non-Medical): Yes  Physical Activity: Not on file  Stress: Not on file  Social Connections: Not on file   Additional Social History:    Sleep: Poor  Appetite:  Good  Current Medications: Current Facility-Administered Medications  Medication Dose Route Frequency Provider Last Rate Last Admin   acetaminophen (TYLENOL) tablet 650 mg  650 mg Oral Q6H PRN Roger Roger [name], Roger Roger [name], Roger Roger [name]   650 mg at 01/31/22 1442   alum & mag hydroxide-simeth (MAALOX/MYLANTA) 200-200-20 MG/5ML suspension 30 mL  30 mL Oral Q4H PRN Roger Roger [name], Roger Roger [name], Roger Roger [name]       hydrOXYzine (ATARAX) tablet 25 mg  25 mg Oral TID PRN Roger Roger [name], Roger Roger [name], Roger Roger [name]   25 mg at 01/29/22 2038   risperiDONE (RISPERDAL M-TABS) disintegrating tablet 2 mg  2 mg Oral Q8H PRN Roger Roger [name], Roger Roger [name], Roger Roger [name]       And   LORazepam (ATIVAN) tablet 1 mg  1 mg Oral PRN Roger Roger [name], Roger Roger [name], Roger Roger [name]       And   ziprasidone (GEODON) injection 20 mg  20 mg Intramuscular PRN Roger Roger [name], Roger Roger [name], Roger Roger [name]       magnesium hydroxide (MILK OF MAGNESIA) suspension 30 mL  30 mL Oral Daily PRN Roger Roger [name], Roger Roger [name], Roger Roger [name]       nicotine (NICODERM CQ - dosed in mg/24 hours) patch 14 mg  14 mg Transdermal Daily Roger Roger [name], Roger Roger [name], Roger Roger [name]   14 mg at 01/29/22 0830   risperiDONE (RISPERDAL M-TABS) disintegrating tablet 1 mg  1 mg Oral Daily Roger Roger [name], Roger Roger [name]   1 mg at 02/01/22 8657   risperiDONE (RISPERDAL M-TABS)  disintegrating tablet 3 mg  3 mg Oral QHS Roger Modisette, Roger Roger [name]   3 mg at 01/31/22 2108   SUMAtriptan (IMITREX) tablet 25 mg  25 mg Oral Q6H PRN Roger Blue, Roger Roger [name]   25 mg at 01/31/22 2110   traZODone (DESYREL) tablet 50 mg  50 mg Oral QHS PRN Roger Blue, Roger Roger [name]   50 mg at 01/31/22 2109    Lab Results:  No results found for this or any previous visit (from the past 48 hour(s)).   Blood Alcohol level:  Lab Results  Component Value Date   [facility] <10 01/21/2022   ETH <10 07/18/2021    Metabolic Disorder Labs: Lab Results  Component Value Date   HGBA1C  5.9 (H) 01/30/2022   MPG 123 01/30/2022   No results found for: "PROLACTIN" No results found for: "CHOL", "TRIG", "HDL", "CHOLHDL", "VLDL", "LDLCALC"  Physical Findings: AIMS: 0 CIWA: n/Roger [name] COWS: n/Roger [name]  Musculoskeletal: Strength & Muscle Tone: within normal limits Gait & Station: normal Patient leans: N/Roger [name]  Psychiatric Specialty Exam:  Presentation  General Appearance:  Appropriate for Environment  Eye Contact: Good  Speech: Clear and Coherent  Speech Volume: Normal  Handedness: Right  Mood and Affect  Mood: Depressed  Affect: Congruent  Thought Process  Thought Processes: Coherent  Descriptions of Associations:Intact  Orientation:Full (Time, Place and Person)  Thought Content:Logical  History of Schizophrenia/Schizoaffective disorder:No  Duration of Psychotic Symptoms:Greater than six months  Hallucinations:Hallucinations: None  Ideas of Reference:None  Suicidal Thoughts:Suicidal Thoughts: No  Homicidal Thoughts:Homicidal Thoughts: No  Sensorium  Memory: Immediate Good  Judgment: Fair  Insight: Fair  Art therapist  Concentration: Fair  Attention Span: Fair  Recall: [employer] of Knowledge: Fair  Language: Fair   Psychomotor Activity  Psychomotor Activity: Psychomotor Activity: Normal  Assets  Assets: Communication Skills; Resilience; Social Support  Sleep   Sleep: Sleep: Good  Physical Exam: Physical Exam Review of Systems  Constitutional:  Negative for fever.  HENT: Negative.    Eyes:  Negative for blurred vision.  Respiratory: Negative.    Cardiovascular: Negative.  Negative for chest pain.  Gastrointestinal: Negative.   Genitourinary: Negative.   Musculoskeletal: Negative.   Skin: Negative.   Neurological: Negative.   Psychiatric/Behavioral:  Positive for depression, hallucinations and substance abuse. Negative for memory loss and suicidal ideas. The patient is nervous/anxious and has insomnia.    Blood pressure 127/76, pulse 91, temperature 97.8 F (36.6 C), temperature source Oral, resp. rate 16, height 5\' 5"  (1.651 m), weight 74.4 kg, SpO2 100 %. Body mass index is 27.29 kg/m.  Treatment Plan Summary: Daily contact with patient to assess and evaluate symptoms and progress in treatment and Medication management  Observation Level/Precautions:  15 minute checks  Laboratory:  Labs reviewed   Psychotherapy:  Unit Group sessions  Medications:  See [facility]  Consultations:  To be determined   Discharge Concerns:  Safety, medication compliance, mood stability  Estimated LOS: 5-7 days  Other:  N/Roger [name]   Labs independently reviewed on 12/27: Repeating CBC due to elevated WBC of 15.4 on 12/21. Tox screen +THC. CMP reviewed. Ordering HA1C, TSH, Vits D, B1, B12, baseline UA and repeat EKG for Qtc trending while on antipsychotics.  PLAN Safety and Monitoring: Voluntary admission to inpatient psychiatric unit for safety, stabilization and treatment Daily contact with patient to assess and evaluate symptoms and progress in treatment Patient's case to be discussed in multi-disciplinary team meeting Observation Level : q15 minute checks Vital signs: q12 hours Precautions: safety  Long Term Goal(s): Improvement in symptoms so as ready for discharge  Short Term Goals: Ability to identify changes in lifestyle to reduce recurrence of condition  will improve, Ability to verbalize feelings will improve, Ability to disclose and discuss suicidal ideas, Ability to identify and develop effective coping behaviors will improve, and Ability to identify triggers associated with substance abuse/mental health issues will improve  Diagnoses  Principal Problem:   MDD (major depressive disorder), recurrent, severe, with psychosis (HCC) Active Problems:   Substance-induced disorder (HCC)   Insomnia   Anxiety state  Medications:  -Continue Risperdal 1 mg nightly and keep 3 mg nightly for psychosis starting today. -Continue Imitrex PRN Q 6 H for migraines -Continue Trazodone 50  mg nightly PRN for insomnia -Continue Hydroxyzine 25 mg every 6 hours PRN -Continue Nicotine patch for nicotine dependence  Other PRNS -Continue Tylenol 650 mg every 6 hours PRN for mild pain -Continue Maalox 30 mg every 4 hrs PRN for indigestion -Continue Milk of Magnesia as needed every 6 hrs for constipation  Discharge Planning: Social work and case management to assist with discharge planning and identification of hospital follow-up needs prior to discharge Estimated LOS: 5-7 days Discharge Concerns: Need to establish Roger [name] safety plan; Medication compliance and effectiveness Discharge Goals: Return home with outpatient referrals for mental health follow-up including medication management/psychotherapy  I certify that inpatient services furnished can reasonably be expected to improve the patient's condition.    Roger Blueoris  Roger Marcantonio, Roger Roger [name] 12/30/20232:59 PM  Patient ID: Roger Roger [name], male   DOB: 1990-08-24, 31 y.o.   MRN: [id number] Patient ID: Roger Roger [name], male   DOB: 1990-08-24, 31 y.o.   MRN: [id number]

## 2022-02-02 DIAGNOSIS — F333 Major depressive disorder, recurrent, severe with psychotic symptoms: Secondary | ICD-10-CM | POA: Diagnosis not present

## 2022-02-02 NOTE — Progress Notes (Signed)
   02/02/22 0800  Psych Admission Type (Psych Patients Only)  Admission Status Involuntary  Psychosocial Assessment  Patient Complaints None  Eye Contact Fair  Facial Expression Animated  Affect Appropriate to circumstance  Speech Logical/coherent  Interaction Assertive  Motor Activity Slow  Appearance/Hygiene Unremarkable  Behavior Characteristics Cooperative;Appropriate to situation  Mood Pleasant  Thought Process  Coherency WDL  Content WDL  Delusions None reported or observed  Perception WDL  Hallucination None reported or observed  Judgment Poor  Confusion None  Danger to Self  Current suicidal ideation? Denies  Danger to Others  Danger to Others None reported or observed

## 2022-02-02 NOTE — Progress Notes (Signed)
Adult Psychoeducational Group Note  Date:  02/02/2022 Time:  9:48 AM  Group Topic/Focus:  Goals Group:   The focus of this group is to help patients establish daily goals to achieve during treatment and discuss how the patient can incorporate goal setting into their daily lives to aide in recovery.  Participation Level:  Did Not Attend  Participation Quality:   n/a  Affect:   n/a  Cognitive:   n/a  Insight: None  Engagement in Group:   n/a  Modes of Intervention:   n/a  Additional Comments:   Patient did not attend the morning goals group.  Edmund Hilda Sharilyn Geisinger 02/02/2022, 9:48 AM

## 2022-02-02 NOTE — Plan of Care (Signed)
  Problem: Education: Goal: Knowledge of [place] [facility] Education information/materials will improve Outcome: Progressing Goal: Emotional status will improve Outcome: Progressing Goal: Mental status will improve Outcome: Progressing Goal: Verbalization of understanding the information provided will improve Outcome: Progressing   Problem: Activity: Goal: Interest or engagement in activities will improve Outcome: Progressing Goal: Sleeping patterns will improve Outcome: Progressing   Problem: Coping: Goal: Ability to verbalize frustrations and anger appropriately will improve Outcome: Progressing Goal: Ability to demonstrate self-control will improve Outcome: Progressing   

## 2022-02-02 NOTE — B[facility] Group Notes (Signed)
Adult Psychoeducational Group Note Date:  02/02/2022 Time:  0900-1000 Group Topic/Focus: PROGRESSIVE RELAXATION. [name] group where deep breathing is taught and tensing and relaxation muscle groups is used. Imagery is used as well.  Pts are asked to imagine 3 pillars that hold them up when they are not able to hold themselves up and to share that with the group.   Participation Level:  did not attend   : Roger [name]   

## 2022-02-02 NOTE — B[facility] Counselor (Signed)
Clinical Social Work Note  CSW spoke with Public relations account executive at [employer] [phone].  They are already going to be in [place] dropping off a patient and picking up another patient on Tuesday 02/04/2022, so they agreed to put patient on the pick-up list for that date.  They have no availability for transport before 1/2.  They do ask to be informed if the patient declines to go by their transportation when he finds out that his family will not be transporting him.  Ambrose Mantle, LCSW 02/02/2022, 5:35 PM

## 2022-02-02 NOTE — Progress Notes (Signed)
   02/01/22 2000  Psych Admission Type (Psych Patients Only)  Admission Status Involuntary  Psychosocial Assessment  Patient Complaints None  Eye Contact Fair  Facial Expression Animated  Affect Appropriate to circumstance  Speech Logical/coherent  Interaction Assertive  Motor Activity Slow  Appearance/Hygiene Unremarkable  Behavior Characteristics Cooperative;Appropriate to situation  Mood Pleasant  Thought Process  Coherency WDL  Content WDL  Delusions None reported or observed  Perception WDL  Hallucination None reported or observed  Judgment Poor  Confusion None  Danger to Self  Current suicidal ideation? Denies  Danger to Others  Danger to Others None reported or observed

## 2022-02-02 NOTE — Progress Notes (Signed)
[facility] MD Progress Note  02/02/2022 4:01 PM [name]  MRN:  [id number] Principal Problem: MDD (major depressive disorder), recurrent, severe, with psychosis (HCC) Diagnosis: Principal Problem:   MDD (major depressive disorder), recurrent, severe, with psychosis (HCC) Active Problems:   Substance-induced disorder (HCC)   Insomnia   Anxiety state  Reason For Admission:  Roger [name] is a 31 year old male patient with past psychiatric history of polysubstance abuse including IVDU and substance induced psychotic disorder who presented to [facility] 01/21/22 via EMS after being found unresponsive in someone's backyard with shirtless and unresponsive; owner called EMS who then gave pt Narcan after not responding then was noted to become agitated and combative requiring medications and physical restraint. Once in the unit ED patient was found with illegal substances in his rectum requiring law enforcement notification. Patient was transferred to [facility] 01/27/22 for further stabilization and treatment.    Daily notes: Tai is seen in his room, chart reviewed. The chart findings discussed with the treatment team. He was lying down in his bed. He says he is trying to catch up on sleep as he did not sleep well last night. He presents alert, oriented & aware of situation. He presents with an improved affect, good eye contact & verbally responsive. He says he did attend group sessions this am. He described his mood as "good". He denies any symptoms of depression or anxiety. He says he is working on going to [employer] center after discharge. Says his mother or grandmother will be taking him to the [facility] treatment center after discharge. He says he is taking & tolerating his treatment regimen. Denies any side effects. Patient at this time denies any SIHI, AVH, delusional thoughts or paranoia. He does not appear to be responding to any internal stimuli. Will continue current plan of care as already  in progress.  Total Time spent with patient:  35 minutes  Past Psychiatric History: Polysubstance abuse, MDD  Past Medical History:  Past Medical History:  Diagnosis Date   MRSA (methicillin resistant Staphylococcus aureus)    Scoliosis    History reviewed. No pertinent surgical history. Family History:  Family History  Problem Relation Age of Onset   Diabetes Mother    Family Psychiatric  History: See H&P.  Social History:  Social History   Substance and Sexual Activity  Alcohol Use Yes   Comment: last drink was a week ago - drinks about a fifth of liquor 1-2 times a week     Social History   Substance and Sexual Activity  Drug Use Yes   Types: Methamphetamines, Marijuana   Comment: last use for both was last week    Social History   Socioeconomic History   Marital status: Single    Spouse name: Not on file   Number of children: Not on file   Years of education: Not on file   Highest education level: Not on file  Occupational History   Not on file  Tobacco Use   Smoking status: Every Day    Packs/day: 2.00    Years: 13.00    Total pack years: 26.00    Types: Cigarettes   Smokeless tobacco: Never  Vaping Use   Vaping Use: Never used  Substance and Sexual Activity   Alcohol use: Yes    Comment: last drink was a week ago - drinks about a fifth of liquor 1-2 times a week   Drug use: Yes    Types: Methamphetamines, Marijuana  Comment: last use for both was last week   Sexual activity: Yes    Birth control/protection: None  Other Topics Concern   Not on file  Social History Narrative   Not on file   Social Determinants of Health   Financial Resource Strain: Not on file  Food Insecurity: Food Insecurity Present (01/28/2022)   Hunger Vital Sign    Worried About Running Out of Food in the Last Year: Sometimes true    Ran Out of Food in the Last Year: Sometimes true  Transportation Needs: Unmet Transportation Needs (01/28/2022)   PRAPARE -  Administrator, Civil Service (Medical): Yes    Lack of Transportation (Non-Medical): Yes  Physical Activity: Not on file  Stress: Not on file  Social Connections: Not on file   Additional Social History:    Sleep: Poor  Appetite:  Good  Current Medications: Current Facility-Administered Medications  Medication Dose Route Frequency Provider Last Rate Last Admin   acetaminophen (TYLENOL) tablet 650 mg  650 mg Oral Q6H PRN Leevy-Johnson, Brooke A, NP   650 mg at 02/02/22 0759   alum & mag hydroxide-simeth (MAALOX/MYLANTA) 200-200-20 MG/5ML suspension 30 mL  30 mL Oral Q4H PRN Leevy-Johnson, Brooke A, NP       hydrOXYzine (ATARAX) tablet 25 mg  25 mg Oral TID PRN Leevy-Johnson, Brooke A, NP   25 mg at 01/29/22 2038   risperiDONE (RISPERDAL M-TABS) disintegrating tablet 2 mg  2 mg Oral Q8H PRN Leevy-Johnson, Brooke A, NP       And   LORazepam (ATIVAN) tablet 1 mg  1 mg Oral PRN Leevy-Johnson, Brooke A, NP       And   ziprasidone (GEODON) injection 20 mg  20 mg Intramuscular PRN Leevy-Johnson, Brooke A, NP       magnesium hydroxide (MILK OF MAGNESIA) suspension 30 mL  30 mL Oral Daily PRN Leevy-Johnson, Brooke A, NP       nicotine (NICODERM CQ - dosed in mg/24 hours) patch 14 mg  14 mg Transdermal Daily Onuoha, Chinwendu V, NP   14 mg at 01/29/22 0830   risperiDONE (RISPERDAL M-TABS) disintegrating tablet 1 mg  1 mg Oral Daily Nkwenti, Doris, NP   1 mg at 02/02/22 0801   risperiDONE (RISPERDAL M-TABS) disintegrating tablet 3 mg  3 mg Oral QHS Nkwenti, Doris, NP   3 mg at 02/01/22 2110   SUMAtriptan (IMITREX) tablet 25 mg  25 mg Oral Q6H PRN Starleen Blue, NP   25 mg at 01/31/22 2110   traZODone (DESYREL) tablet 50 mg  50 mg Oral QHS PRN Starleen Blue, NP   50 mg at 02/01/22 2109   Lab Results:  No results found for this or any previous visit (from the past 48 hour(s)).  Blood Alcohol level:  Lab Results  Component Value Date   ETH <10 01/21/2022   ETH <10 07/18/2021    Metabolic Disorder Labs: Lab Results  Component Value Date   HGBA1C 5.9 (H) 01/30/2022   MPG 123 01/30/2022   No results found for: "PROLACTIN" No results found for: "CHOL", "TRIG", "HDL", "CHOLHDL", "VLDL", "LDLCALC"  Physical Findings: AIMS: 0 CIWA: n/a COWS: n/a  Musculoskeletal: Strength & Muscle Tone: within normal limits Gait & Station: normal Patient leans: N/A  Psychiatric Specialty Exam:  Presentation  General Appearance:  Appropriate for Environment  Eye Contact: Good  Speech: Clear and Coherent  Speech Volume: Normal  Handedness: Right  Mood and Affect  Mood: Depressed  Affect:  Congruent  Thought Process  Thought Processes: Coherent  Descriptions of Associations:Intact  Orientation:Full (Time, Place and Person)  Thought Content:Logical  History of Schizophrenia/Schizoaffective disorder:No  Duration of Psychotic Symptoms:Greater than six months  Hallucinations:Hallucinations: None Description of Auditory Hallucinations: NA Description of Visual Hallucinations: NA  Ideas of Reference:None  Suicidal Thoughts:Suicidal Thoughts: No  Homicidal Thoughts:Homicidal Thoughts: No  Sensorium  Memory: Immediate Good  Judgment: Fair  Insight: Fair  Art therapist  Concentration: Fair  Attention Span: Fair  Recall: [employer] of Knowledge: Fair  Language: Fair   Psychomotor Activity  Psychomotor Activity: Psychomotor Activity: Normal  Assets  Assets: Communication Skills; Resilience; Social Support  Sleep  Sleep: Sleep: Fair Number of Hours of Sleep: 5.5  Physical Exam: Physical Exam Vitals and nursing note reviewed.  HENT:     Nose: Nose normal.     Mouth/Throat:     Pharynx: Oropharynx is clear.  Eyes:     Pupils: Pupils are equal, round, and reactive to light.  Cardiovascular:     Rate and Rhythm: Normal rate.     Comments: Elevated pulse rate: 108.  Patient currently is in no apparent  distress. Pulmonary:     Effort: Pulmonary effort is normal.  Genitourinary:    Comments: Deferred Musculoskeletal:        General: Normal range of motion.     Cervical back: Normal range of motion.  Skin:    General: Skin is warm and dry.  Neurological:     General: No focal deficit present.     Mental Status: He is alert and oriented to person, place, and time.    Review of Systems  Constitutional:  Negative for chills, diaphoresis and fever.  HENT:  Negative for congestion and sore throat.   Eyes:  Negative for blurred vision.  Respiratory:  Negative for cough, shortness of breath and wheezing.   Cardiovascular:  Negative for chest pain and palpitations.  Gastrointestinal:  Negative for abdominal pain, diarrhea, heartburn, nausea and vomiting.  Genitourinary:  Negative for dysuria.  Musculoskeletal:  Negative for joint pain and myalgias.  Skin:  Negative for itching and rash.  Neurological:  Negative for dizziness, tingling, tremors, sensory change, speech change, focal weakness, seizures, loss of consciousness, weakness and headaches.  Endo/Heme/Allergies:        Allergies: NKDA  Psychiatric/Behavioral:  Positive for depression, hallucinations and substance abuse. Negative for memory loss and suicidal ideas. The patient is nervous/anxious and has insomnia.    Blood pressure 125/81, pulse (!) 108, temperature 97.7 F (36.5 C), temperature source Oral, resp. rate 16, height 5\' 5"  (1.651 m), weight 74.4 kg, SpO2 100 %. Body mass index is 27.29 kg/m.  Treatment Plan Summary: Daily contact with patient to assess and evaluate symptoms and progress in treatment and Medication management.   Continue inpatient hospitalization.  Will continue today 02/02/2022 plan as below except where it is noted.   Principal Problem: Major depressive disorder, recurrent, severe, with psychosis (HCC).   Active Problems: Substance-induced disorder (HCC)    PLAN Safety and  Monitoring: Voluntary admission to inpatient psychiatric unit for safety, stabilization and treatment Daily contact with patient to assess and evaluate symptoms and progress in treatment Patient's case to be discussed in multi-disciplinary team meeting Observation Level : q15 minute checks Vital signs: q12 hours Precautions: safety  Medications:  -Continue Risperdal 1 mg nightly and keep 3 mg nightly for psychosis starting today. -Continue Imitrex PRN Q 6 H for migraines -Continue Trazodone 50 mg nightly  PRN for insomnia -Continue Hydroxyzine 25 mg every 6 hours PRN -Continue Nicotine patch for nicotine dependence  Other PRNS -Continue Tylenol 650 mg every 6 hours PRN for mild pain -Continue Maalox 30 mg every 4 hrs PRN for indigestion -Continue Milk of Magnesia as needed every 6 hrs for constipation  Discharge Planning: Social work and case management to assist with discharge planning and identification of hospital follow-up needs prior to discharge Estimated LOS: 5-7 days Discharge Concerns: Need to establish a safety plan; Medication compliance and effectiveness Discharge Goals: Return home with outpatient referrals for mental health follow-up including medication management/psychotherapy  I certify that inpatient services furnished can reasonably be expected to improve the patient's condition.    Armandina StammerAgnes Ramsay Bognar, NP, pmhnp, fnp-bc 12/31/20234:01 PM  Patient ID: Earnie LarssonJoshua D Quinones, male   DOB: [birth year], 31 y.o.   MRN: [id number] Patient ID: Earnie LarssonJoshua D Grenier, male   DOB: [birth year], 31 y.o.   MRN: [id number] Patient ID: Earnie LarssonJoshua D Hypolite, male   DOB: [birth year], 31 y.o.   MRN: [id number]

## 2022-02-02 NOTE — Group Note (Signed)
  [facility] LCSW Group Therapy Note  Date/Time:  02/02/2022 10:00am-11:00am  Type of Therapy and Topic:  Group Therapy:  Stability after Discharge  Participation Level:  Did Not Attend   Description of Group This process group involved patients discussing what their overall goal is at this time in their life and how they plan to work toward that goal when they get home from the hospital.  The group started with patients sharing their goal and plans, then proceeded with group members identifying with each other.  A discussion ensued about the differences in healthy and unhealthy coping skills and a variety of specific coping skills that might be helpful in specific instances were shared.  Group members shared ideas about making changes when they return home so that they can stay well and in recovery.  These included boundaries, putting oneself first, staying on medications, talking to a therapist, and more  Therapeutic Goals Patient will identify one overall goal Patient will list current ideas for how to go about achieving their goal Patient will participate in generating ideas about healthy self-care options when they return to the community Patient will be supportive of one another and receive support from others Patient will receive affirmations and hope  Summary of Patient Progress:  N/A   Therapeutic Modalities Brief Solution-Focused Therapy Psychoeducation   Nivia Gervase Grossman-Orr, LCSW 02/02/2022, 12:00pm    

## 2022-02-03 ENCOUNTER — Encounter (HOSPITAL_COMMUNITY): Payer: Self-pay

## 2022-02-03 DIAGNOSIS — F333 Major depressive disorder, recurrent, severe with psychotic symptoms: Secondary | ICD-10-CM | POA: Diagnosis not present

## 2022-02-03 NOTE — Progress Notes (Signed)
Patient resting quietly in bed with eyes closed, Respirations equal and unlabored, skin warm and dry, NAD. Routine safety checks conducted according to facility protocol. Will continue to monitor for safety. 

## 2022-02-03 NOTE — Plan of Care (Signed)
  Problem: Education: Goal: Emotional status will improve Outcome: Progressing Goal: Mental status will improve Outcome: Progressing   

## 2022-02-03 NOTE — BH IP Treatment Plan (Signed)
Interdisciplinary Treatment and Diagnostic Plan Update  02/03/2022 Time of Session: 1100 Roger [name] MRN: [id number]  Principal Diagnosis: MDD (major depressive disorder), recurrent, severe, with psychosis ([place])  Secondary Diagnoses: Principal Problem:   MDD (major depressive disorder), recurrent, severe, with psychosis ([place]) Active Problems:   Substance-induced disorder ([place])   Insomnia   Anxiety state   Current Medications:  Current Facility-Administered Medications  Medication Dose Route Frequency Provider Last Rate Last Admin   acetaminophen (TYLENOL) tablet 650 mg  650 mg Oral Q6H PRN Leevy-Johnson, Brooke A, NP   650 mg at 02/02/22 0759   alum & mag hydroxide-simeth (MAALOX/MYLANTA) 200-200-20 MG/5ML suspension 30 mL  30 mL Oral Q4H PRN Leevy-Johnson, Brooke A, NP       hydrOXYzine (ATARAX) tablet 25 mg  25 mg Oral TID PRN Leevy-Johnson, Brooke A, NP   25 mg at 02/03/22 0735   risperiDONE (RISPERDAL M-TABS) disintegrating tablet 2 mg  2 mg Oral Q8H PRN Leevy-Johnson, Brooke A, NP       And   LORazepam (ATIVAN) tablet 1 mg  1 mg Oral PRN Leevy-Johnson, Brooke A, NP       And   ziprasidone (GEODON) injection 20 mg  20 mg Intramuscular PRN Leevy-Johnson, Brooke A, NP       magnesium hydroxide (MILK OF MAGNESIA) suspension 30 mL  30 mL Oral Daily PRN Leevy-Johnson, Brooke A, NP       nicotine (NICODERM CQ - dosed in mg/24 hours) patch 14 mg  14 mg Transdermal Daily Onuoha, Chinwendu V, NP   14 mg at 01/29/22 0830   risperiDONE (RISPERDAL M-TABS) disintegrating tablet 1 mg  1 mg Oral Daily Nkwenti, Doris, NP   1 mg at 02/03/22 0734   risperiDONE (RISPERDAL M-TABS) disintegrating tablet 3 mg  3 mg Oral QHS Nkwenti, Doris, NP   3 mg at 02/02/22 2028   SUMAtriptan (IMITREX) tablet 25 mg  25 mg Oral Q6H PRN Nicholes Rough, NP   25 mg at 01/31/22 2110   traZODone (DESYREL) tablet 50 mg  50 mg Oral QHS PRN Nicholes Rough, NP   50 mg at 02/02/22 2030   PTA Medications: No medications  prior to admission.    Patient Stressors: Substance abuse    Patient Strengths: Average or above average intelligence  Motivation for treatment/growth  Supportive family/friends   Treatment Modalities: Medication Management, Group therapy, Case management,  1 to 1 session with clinician, Psychoeducation, Recreational therapy.   Physician Treatment Plan for Primary Diagnosis: MDD (major depressive disorder), recurrent, severe, with psychosis ([place]) Long Term Goal(s): Improvement in symptoms so as ready for discharge   Short Term Goals: Ability to identify changes in lifestyle to reduce recurrence of condition will improve Ability to verbalize feelings will improve Ability to disclose and discuss suicidal ideas Ability to identify and develop effective coping behaviors will improve Ability to identify triggers associated with substance abuse/mental health issues will improve  Medication Management: Evaluate patient's response, side effects, and tolerance of medication regimen.  Therapeutic Interventions: 1 to 1 sessions, Unit Group sessions and Medication administration.  Evaluation of Outcomes: Progressing  Physician Treatment Plan for Secondary Diagnosis: Principal Problem:   MDD (major depressive disorder), recurrent, severe, with psychosis ([place]) Active Problems:   Substance-induced disorder ([name])   Insomnia   Anxiety state  Long Term Goal(s): Improvement in symptoms so as ready for discharge   Short Term Goals: Ability to identify changes in lifestyle to reduce recurrence of condition will improve Ability to verbalize  feelings will improve Ability to disclose and discuss suicidal ideas Ability to identify and develop effective coping behaviors will improve Ability to identify triggers associated with substance abuse/mental health issues will improve     Medication Management: Evaluate patient's response, side effects, and tolerance of medication regimen.  Therapeutic  Interventions: 1 to 1 sessions, Unit Group sessions and Medication administration.  Evaluation of Outcomes: Progressing   RN Treatment Plan for Primary Diagnosis: MDD (major depressive disorder), recurrent, severe, with psychosis ([place]) Long Term Goal(s): Knowledge of disease and therapeutic regimen to maintain health will improve  Short Term Goals: Ability to remain free from injury will improve, Ability to verbalize frustration and anger appropriately will improve, Ability to demonstrate self-control, Ability to participate in decision making will improve, Ability to verbalize feelings will improve, Ability to disclose and discuss suicidal ideas, Ability to identify and develop effective coping behaviors will improve, and Compliance with prescribed medications will improve  Medication Management: RN will administer medications as ordered by provider, will assess and evaluate patient's response and provide education to patient for prescribed medication. RN will report any adverse and/or side effects to prescribing provider.  Therapeutic Interventions: 1 on 1 counseling sessions, Psychoeducation, Medication administration, Evaluate responses to treatment, Monitor vital signs and CBGs as ordered, Perform/monitor CIWA, COWS, AIMS and Fall Risk screenings as ordered, Perform wound care treatments as ordered.  Evaluation of Outcomes: Progressing   LCSW Treatment Plan for Primary Diagnosis: MDD (major depressive disorder), recurrent, severe, with psychosis ([place]) Long Term Goal(s): Safe transition to appropriate next level of care at discharge, Engage patient in therapeutic group addressing interpersonal concerns.  Short Term Goals: Engage patient in aftercare planning with referrals and resources, Increase social support, Increase ability to appropriately verbalize feelings, Increase emotional regulation, Facilitate acceptance of mental health diagnosis and concerns, Facilitate patient progression  through stages of change regarding substance use diagnoses and concerns, Identify triggers associated with mental health/substance abuse issues, and Increase skills for wellness and recovery  Therapeutic Interventions: Assess for all discharge needs, 1 to 1 time with Social worker, Explore available resources and support systems, Assess for adequacy in community support network, Educate family and significant other(s) on suicide prevention, Complete Psychosocial Assessment, Interpersonal group therapy.  Evaluation of Outcomes: Progressing   Progress in Treatment: Attending groups: Yes. Participating in groups: Yes. Taking medication as prescribed: Yes. Toleration medication: Yes. Family/Significant other contact made: Yes, individual(s) contacted:   Salome Holmes, aunt, [phone] Patient understands diagnosis: Yes. Discussing patient identified problems/goals with staff: Yes. Medical problems stabilized or resolved: Yes. Denies suicidal/homicidal ideation: Yes. Issues/concerns per patient self-inventory: Yes. Other:   New problem(s) identified: No, Describe:  None Reported  New Short Term/Long Term Goal(s):Substance Use Treatment  Patient Goals:  Medication Stabilization  Discharge Plan or Barriers: None Reported  Reason for Continuation of Hospitalization: Anxiety Depression Medication stabilization Suicidal ideation Withdrawal symptoms  Estimated Length of Stay: 3-7 Days  Last 3 [place] Suicide Severity Risk Score: [place] Admission (Current) from 01/28/2022 in [facility] ED from 07/18/2021 in [facility] ED from 10/15/2020 in [facility] No Risk No Risk No Risk       Last PHQ 2/9 Scores:     No data to display         detox, medication management for mood stabilization; elimination of SI thoughts; development of comprehensive mental  wellness/sobriety plan   Scribe for Treatment Team: Windle Guard,  LCSW 02/03/2022 2:13 PM

## 2022-02-03 NOTE — Progress Notes (Signed)
The focus of this group is to help patients establish daily goals to achieve during treatment and discuss how the patient can incorporate goal setting into their daily lives to aide in recovery.  Pt attended the morning goals group and responded to all discussion prompts from the [place]. Pt shared that his goals today included "doing my best to get better" while also working on his communication and understanding.  Pt's affect was appropriate and he also completed his self inventory sheet, which he submitted to the [place].

## 2022-02-03 NOTE — Progress Notes (Signed)
[facility] MD Progress Note  02/03/2022 12:33 PM Roger [name]  MRN:  [id number] Principal Problem: MDD (major depressive disorder), recurrent, severe, with psychosis ([place]) Diagnosis: Principal Problem:   MDD (major depressive disorder), recurrent, severe, with psychosis ([place]) Active Problems:   Substance-induced disorder ([place])   Insomnia   Anxiety state  Reason For Admission:  Roger [name] is a 32 year old male patient with past psychiatric history of polysubstance abuse including IVDU and substance induced psychotic disorder who presented to [facility] 01/21/22 via EMS after being found unresponsive in someone's backyard with shirtless and unresponsive; owner called EMS who then gave pt Narcan after not responding then was noted to become agitated and combative requiring medications and physical restraint. Once in the unit ED patient was found with illegal substances in his rectum requiring law enforcement notification. Patient was transferred to [facility] 01/27/22 for further stabilization and treatment.    Daily notes: Roger [name] is seen in his room, chart reviewed. The chart findings discussed with the treatment team. He was lying down in his bed. He says he is trying to catch up on sleep as he did not sleep well last night. He presents alert, oriented & aware of situation. He presents with an improved affect, good eye contact & verbally responsive. He says he is ready to leave the hospital. His mood is good. His sleep is "pretty fair" and his appetite has bene good that he gained about 5 lbs since admission. Stated he will go to the substance use facility in [place]. Stated his grandmother could transport him there but he is unsure whether he had transportation. He stated he called the facility several times but couldn't reach out. He has no concerns about his medications. He denies side effects. Patient at this time denies any SIHI, AVH, delusional thoughts or paranoia. He does not appear to be responding to any  internal stimuli.   Total Time spent with patient: 25 minutes  Past Psychiatric History: Polysubstance abuse, MDD  Past Medical History:  Past Medical History:  Diagnosis Date   MRSA (methicillin resistant Staphylococcus aureus)    Scoliosis    History reviewed. No pertinent surgical history. Family History:  Family History  Problem Relation Age of Onset   Diabetes Mother    Family Psychiatric  History: See H&P.  Social History:  Social History   Substance and Sexual Activity  Alcohol Use Yes   Comment: last drink was a week ago - drinks about a fifth of liquor 1-2 times a week     Social History   Substance and Sexual Activity  Drug Use Yes   Types: Methamphetamines, Marijuana   Comment: last use for both was last week    Social History   Socioeconomic History   Marital status: Single    Spouse name: Not on file   Number of children: Not on file   Years of education: Not on file   Highest education level: Not on file  Occupational History   Not on file  Tobacco Use   Smoking status: Every Day    Packs/day: 2.00    Years: 13.00    Total pack years: 26.00    Types: Cigarettes   Smokeless tobacco: Never  Vaping Use   Vaping Use: Never used  Substance and Sexual Activity   Alcohol use: Yes    Comment: last drink was a week ago - drinks about a fifth of liquor 1-2 times a week   Drug use: Yes  Types: Methamphetamines, Marijuana    Comment: last use for both was last week   Sexual activity: Yes    Birth control/protection: None  Other Topics Concern   Not on file  Social History Narrative   Not on file   Social Determinants of Health   Financial Resource Strain: Not on file  Food Insecurity: Food Insecurity Present (01/28/2022)   Hunger Vital Sign    Worried About Running Out of Food in the Last Year: Sometimes true    Ran Out of Food in the Last Year: Sometimes true  Transportation Needs: Unmet Transportation Needs (01/28/2022)   PRAPARE -  Hydrologist (Medical): Yes    Lack of Transportation (Non-Medical): Yes  Physical Activity: Not on file  Stress: Not on file  Social Connections: Not on file   Additional Social History:    Sleep: Poor  Appetite:  Good  Current Medications: Current Facility-Administered Medications  Medication Dose Route Frequency Provider Last Rate Last Admin   acetaminophen (TYLENOL) tablet 650 mg  650 mg Oral Q6H PRN Leevy-Johnson, Brooke A, NP   650 mg at 02/02/22 0759   alum & mag hydroxide-simeth (MAALOX/MYLANTA) 200-200-20 MG/5ML suspension 30 mL  30 mL Oral Q4H PRN Leevy-Johnson, Brooke A, NP       hydrOXYzine (ATARAX) tablet 25 mg  25 mg Oral TID PRN Leevy-Johnson, Brooke A, NP   25 mg at 02/03/22 0735   risperiDONE (RISPERDAL M-TABS) disintegrating tablet 2 mg  2 mg Oral Q8H PRN Leevy-Johnson, Brooke A, NP       And   LORazepam (ATIVAN) tablet 1 mg  1 mg Oral PRN Leevy-Johnson, Brooke A, NP       And   ziprasidone (GEODON) injection 20 mg  20 mg Intramuscular PRN Leevy-Johnson, Brooke A, NP       magnesium hydroxide (MILK OF MAGNESIA) suspension 30 mL  30 mL Oral Daily PRN Leevy-Johnson, Brooke A, NP       nicotine (NICODERM CQ - dosed in mg/24 hours) patch 14 mg  14 mg Transdermal Daily Onuoha, Chinwendu V, NP   14 mg at 01/29/22 0830   risperiDONE (RISPERDAL M-TABS) disintegrating tablet 1 mg  1 mg Oral Daily Nkwenti, Doris, NP   1 mg at 02/03/22 0734   risperiDONE (RISPERDAL M-TABS) disintegrating tablet 3 mg  3 mg Oral QHS Nkwenti, Doris, NP   3 mg at 02/02/22 2028   SUMAtriptan (IMITREX) tablet 25 mg  25 mg Oral Q6H PRN Nicholes Rough, NP   25 mg at 01/31/22 2110   traZODone (DESYREL) tablet 50 mg  50 mg Oral QHS PRN Nicholes Rough, NP   50 mg at 02/02/22 2030   Lab Results:  No results found for this or any previous visit (from the past 61 hour(s)).  Blood Alcohol level:  Lab Results  Component Value Date   ETH <10 01/21/2022   ETH <10 123XX123    Metabolic Disorder Labs: Lab Results  Component Value Date   HGBA1C 5.9 (H) 01/30/2022   MPG 123 01/30/2022   No results found for: "PROLACTIN" No results found for: "CHOL", "TRIG", "HDL", "CHOLHDL", "VLDL", "LDLCALC"  Physical Findings: AIMS: 0 CIWA: n/a COWS: n/a  Musculoskeletal: Strength & Muscle Tone: within normal limits Gait & Station: normal Patient leans: N/A  Psychiatric Specialty Exam:  Presentation  General Appearance: Appropriate for Environment  Eye Contact: Good  Speech:Clear and Coherent  Speech Volume:Normal  Handedness:Right  Mood and Affect  Mood: Good  Affect:Congruent  Thought Process  Thought Processes:Coherent  Descriptions of Associations:Intact  Orientation:Full (Time, Place and Person)  Thought Content:Logical  History of Schizophrenia/Schizoaffective disorder:No  Duration of Psychotic Symptoms:Greater than six months  Hallucinations: Patient denies, not responding to internal stimuli  Ideas of Reference:None  Suicidal Thoughts: Patient denies  Homicidal Thoughts: Patient denies  Sensorium  Memory: Immediate Good  Judgment:Fair  Insight:Fair  Executive Functions  Concentration:Fair  Attention Span:Fair  [place]   Psychomotor Activity  Psychomotor Activity: Normal  Assets  Assets: Armed forces logistics/support/administrative officer; Resilience; Social Support  Sleep  Sleep: Good  Physical Exam: Physical Exam Vitals and nursing note reviewed.  HENT:     Nose: Nose normal.     Mouth/Throat:     Pharynx: Oropharynx is clear.  Eyes:     Pupils: Pupils are equal, round, and reactive to light.  Cardiovascular:     Rate and Rhythm: Normal rate.     Comments: Elevated pulse rate: 108.  Patient currently is in no apparent distress. Pulmonary:     Effort: Pulmonary effort is normal.  Genitourinary:    Comments: Deferred Musculoskeletal:        General: Normal range of motion.      Cervical back: Normal range of motion.  Skin:    General: Skin is warm and dry.  Neurological:     General: No focal deficit present.     Mental Status: He is alert and oriented to person, place, and time.    Review of Systems  Constitutional:  Negative for chills, diaphoresis and fever.  HENT:  Negative for congestion and sore throat.   Eyes:  Negative for blurred vision.  Respiratory:  Negative for cough, shortness of breath and wheezing.   Cardiovascular:  Negative for chest pain and palpitations.  Gastrointestinal:  Negative for abdominal pain, diarrhea, heartburn, nausea and vomiting.  Genitourinary:  Negative for dysuria.  Musculoskeletal:  Negative for joint pain and myalgias.  Skin:  Negative for itching and rash.  Neurological:  Negative for dizziness, tingling, tremors, sensory change, speech change, focal weakness, seizures, loss of consciousness, weakness and headaches.  Endo/Heme/Allergies:        Allergies: NKDA  Psychiatric/Behavioral:  Positive for depression, hallucinations and substance abuse. Negative for memory loss and suicidal ideas. The patient is nervous/anxious and has insomnia.    Blood pressure (!) 140/82, pulse 96, temperature 97.7 F (36.5 C), temperature source Oral, resp. rate 16, height 5\' 5"  (1.651 m), weight 74.4 kg, SpO2 100 %. Body mass index is 27.29 kg/m.  Treatment Plan Summary: Daily contact with patient to assess and evaluate symptoms and progress in treatment and Medication management.   Continue inpatient hospitalization.  Will continue today 02/03/2022 plan as below except where it is noted.   Principal Problem: Major depressive disorder, recurrent, severe, with psychosis ([place]).   Active Problems: Substance-induced disorder ([place])    PLAN Safety and Monitoring: Voluntary admission to inpatient psychiatric unit for safety, stabilization and treatment Daily contact with patient to assess and evaluate symptoms and progress in  treatment Patient's case to be discussed in multi-disciplinary team meeting Observation Level : q15 minute checks Vital signs: q12 hours Precautions: safety  Medications:  -Continue Risperdal 1 mg nightly and keep 3 mg nightly for psychosis -Continue Imitrex PRN Q 6 H for migraines -Continue Trazodone 50 mg nightly PRN for insomnia -Continue Hydroxyzine 25 mg every 6 hours PRN -Continue Nicotine patch for nicotine dependence  Other PRNS -Continue Tylenol 650  mg every 6 hours PRN for mild pain -Continue Maalox 30 mg every 4 hrs PRN for indigestion -Continue Milk of Magnesia as needed every 6 hrs for constipation  Discharge Planning: Social work and case management to assist with discharge planning and identification of hospital follow-up needs prior to discharge Estimated LOS: 5-7 days Discharge Concerns: Need to establish a safety plan; Medication compliance and effectiveness Discharge Goals: Return home with outpatient referrals for mental health follow-up including medication management/psychotherapy  I certify that inpatient services furnished can reasonably be expected to improve the patient's condition.    Helane Gunther, MD, 1/1/202412:33 PM

## 2022-02-03 NOTE — Progress Notes (Signed)
   02/03/22 0400  Psych Admission Type (Psych Patients Only)  Admission Status Involuntary  Psychosocial Assessment  Patient Complaints None  Eye Contact Fair  Facial Expression Animated  Affect Appropriate to circumstance  Speech Logical/coherent  Interaction Assertive  Motor Activity Slow  Appearance/Hygiene Unremarkable  Behavior Characteristics Cooperative;Appropriate to situation;Calm  Mood Pleasant  Thought Process  Coherency WDL  Content WDL  Delusions None reported or observed  Perception WDL  Hallucination None reported or observed  Judgment Poor  Confusion None  Danger to Self  Current suicidal ideation? Denies  Danger to Others  Danger to Others None reported or observed

## 2022-02-03 NOTE — Progress Notes (Signed)
   02/03/22 1000  Psych Admission Type (Psych Patients Only)  Admission Status Involuntary  Psychosocial Assessment  Patient Complaints None  Eye Contact Fair  Facial Expression Animated  Affect Appropriate to circumstance  Speech Logical/coherent  Interaction Assertive  Motor Activity Slow  Appearance/Hygiene Unremarkable  Behavior Characteristics Cooperative;Appropriate to situation  Mood Anxious  Thought Process  Coherency WDL  Content WDL  Delusions None reported or observed  Perception WDL  Hallucination None reported or observed  Judgment Poor  Confusion None  Danger to Self  Current suicidal ideation? Denies  Danger to Others  Danger to Others None reported or observed

## 2022-02-03 NOTE — Progress Notes (Signed)
   02/03/22 0900  Charting Type  Charting Type Shift assessment  Safety Check Verification  Has the RN verified the 15 minute safety check completion? Yes  Neurological  Neuro (WDL) WDL  HEENT  HEENT (WDL) WDL  Respiratory  Respiratory (WDL) WDL  Cardiac  Cardiac (WDL) WDL  Vascular  Vascular (WDL) WDL  Integumentary  Integumentary (WDL) X  Braden Scale (Ages 8 and up)  Sensory Perceptions 4  Moisture 4  Activity 4  Mobility 4  Nutrition 4  Friction and Shear 3  Braden Scale Score 23  Musculoskeletal  Musculoskeletal (WDL) WDL  Gastrointestinal  Gastrointestinal (WDL) WDL  GU Assessment  Genitourinary (WDL) WDL  Neurological  Level of Consciousness Alert

## 2022-02-03 NOTE — Progress Notes (Signed)
Adult Psychoeducational Group Note  Date:  02/03/2022 Time:  8:58 PM  Group Topic/Focus:  Wrap-Up Group:   The focus of this group is to help patients review their daily goal of treatment and discuss progress on daily workbooks.  Participation Level:  Active  Participation Quality:  Appropriate  Affect:  Appropriate  Cognitive:  Appropriate  Insight: Appropriate  Engagement in Group:  Engaged  Modes of Intervention:  Education and Exploration  Additional Comments:  Patient attended and participated in group tonight. He reports taht he had a positiveday. He has been fighting his own battle and realized all has there own battle to fight . He has a open ,mind.  Salley Scarlet [facility] 02/03/2022, 8:58 PM

## 2022-02-03 NOTE — Progress Notes (Signed)
   02/03/22 2000  Psych Admission Type (Psych Patients Only)  Admission Status Involuntary  Psychosocial Assessment  Patient Complaints None  Eye Contact Fair  Facial Expression Anxious  Affect Appropriate to circumstance  Speech Logical/coherent  Interaction Assertive  Motor Activity Slow  Appearance/Hygiene Unremarkable  Behavior Characteristics Cooperative  Mood Anxious  Aggressive Behavior  Effect No apparent injury  Thought Process  Coherency WDL  Content WDL  Delusions WDL  Perception WDL  Hallucination None reported or observed  Judgment Poor  Confusion None  Danger to Self  Current suicidal ideation? Denies  Danger to Others  Danger to Others None reported or observed

## 2022-02-04 DIAGNOSIS — F1520 Other stimulant dependence, uncomplicated: Secondary | ICD-10-CM | POA: Diagnosis not present

## 2022-02-04 DIAGNOSIS — F1420 Cocaine dependence, uncomplicated: Secondary | ICD-10-CM | POA: Diagnosis not present

## 2022-02-04 DIAGNOSIS — R69 Illness, unspecified: Secondary | ICD-10-CM | POA: Diagnosis not present

## 2022-02-04 DIAGNOSIS — F17200 Nicotine dependence, unspecified, uncomplicated: Secondary | ICD-10-CM | POA: Diagnosis not present

## 2022-02-04 DIAGNOSIS — F1523 Other stimulant dependence with withdrawal: Secondary | ICD-10-CM | POA: Diagnosis not present

## 2022-02-04 DIAGNOSIS — F333 Major depressive disorder, recurrent, severe with psychotic symptoms: Secondary | ICD-10-CM | POA: Diagnosis not present

## 2022-02-04 DIAGNOSIS — F4310 Post-traumatic stress disorder, unspecified: Secondary | ICD-10-CM | POA: Diagnosis not present

## 2022-02-04 DIAGNOSIS — F419 Anxiety disorder, unspecified: Secondary | ICD-10-CM | POA: Diagnosis not present

## 2022-02-04 LAB — VITAMIN B1: Vitamin B1 (Thiamine): 139.5 nmol/L (ref 66.5–200.0)

## 2022-02-04 MED ORDER — RISPERIDONE 1 MG PO TABS: 1.0000 mg | ORAL_TABLET | Freq: Every day | ORAL | 0 refills | Status: AC

## 2022-02-04 MED ORDER — RISPERIDONE 3 MG PO TABS
3.0000 mg | ORAL_TABLET | Freq: Every day | ORAL | Status: DC
  Filled 2022-02-04 (×2): qty 7

## 2022-02-04 MED ORDER — NICOTINE 14 MG/24HR TD PT24: 14.0000 mg | MEDICATED_PATCH | Freq: Every day | TRANSDERMAL | 0 refills | Status: AC

## 2022-02-04 MED ORDER — RISPERIDONE 3 MG PO TABS: 3.0000 mg | ORAL_TABLET | Freq: Every day | ORAL | 0 refills | Status: AC

## 2022-02-04 MED ORDER — RISPERIDONE 1 MG PO TABS
1.0000 mg | ORAL_TABLET | Freq: Every day | ORAL | Status: DC
  Filled 2022-02-04 (×2): qty 7

## 2022-02-04 NOTE — Progress Notes (Signed)
[facility] LCSW Progress Note   02/04/2022    8:21 AM  Roger [name]      Type of Note: [facility] reached out to facility this morning regarding patient pick up time and pick up time is scheduled for noon today. CSW will follow up with patient about the plans.     Signed:   Silas Flood, MSW, LCSWA 02/04/2022 8:21 AM

## 2022-02-04 NOTE — Progress Notes (Signed)
  [facility] Adult Case Management Discharge Plan :  Will you be returning to the same living situation after discharge:  No.Patient will be going to [facility]  At discharge, do you have transportation home?: Yes,  [place] will be picking patient up today @ 12:00 PM  Do you have the ability to pay for your medications: Yes,  Patient has [employer] of information consent forms completed and in the chart;  Patient's signature needed at discharge.  Patient to Follow up at:  [facility] to.   Why: [employer] will be providing you with transportation today @ 12:00PM. Contact information: [place] [address] [phone]                 Next level of care provider has access to [place] and Suicide Prevention discussed: Nilsa Nutting, [phone]     Has patient been referred to the Quitline?: Patient refused referral  Patient has been referred for addiction treatment: Yes. Patient will be DC from [facility] and going to [place], [place] 02/04/2022, 10:31 AM

## 2022-02-04 NOTE — Progress Notes (Signed)
Recreation Therapy Notes  INPATIENT RECREATION TR PLAN  Patient Details Name: Roger [name] MRN: [id number] DOB: 06/04/1990 Today's Date: 02/04/2022  Rec Therapy Plan Is patient appropriate for Therapeutic Recreation?: Yes Treatment times per week: about 3 days Estimated Length of Stay: 5-7 days TR Treatment/Interventions: Group participation (Comment)  Discharge Criteria Pt will be discharged from therapy if:: Discharged Treatment plan/goals/alternatives discussed and agreed upon by:: Patient/family  Discharge Summary Short term goals set: See patient care plan Short term goals met: Not met Progress toward goals comments: Groups attended Which groups?: Coping skills Reason goals not met: Pt attended one group session Therapeutic equipment acquired: N/A Reason patient discharged from therapy: Discharge from hospital Pt/family agrees with progress & goals achieved: Yes Date patient discharged from therapy: 02/04/22   Vy Badley-McCall, LRT,CTRS Javari Bufkin A [name] 02/04/2022, 2:11 PM

## 2022-02-04 NOTE — Progress Notes (Signed)
   02/04/22 0604  15 Minute Checks  Location Bedroom  Visual Appearance Calm  Behavior Composed  Sleep (Behavioral Health Patients Only)  Calculate sleep? (Click Yes once per 24 hr at 0600 safety check) Yes  Documented sleep last 24 hours 8.25

## 2022-02-04 NOTE — Discharge Summary (Signed)
Physician Discharge Summary Note  Patient:  Roger [name] is an 32 y.o., male MRN:  [id number] DOB:  [birth year] Patient phone:  [phone] (home)  Patient address:   [address] [place] [address],  Total Time spent with patient: 30 minutes  Date of Admission:  01/28/2022 Date of Discharge: 02/04/2021  Reason for Admission:  Roger [name] is a 32 year old male patient with past psychiatric history of polysubstance abuse including IVDU and substance induced psychotic disorder who presented to [facility] 01/21/22 via EMS after being found unresponsive in someone's backyard with shirtless and unresponsive; owner called EMS who then gave pt Narcan after not responding then was noted to become agitated and combative requiring medications and physical restraint. Once in the unit ED patient was found with illegal substances in his rectum requiring law enforcement notification. Patient was transferred to [facility] 01/27/22 for further stabilization and treatment.   Principal Problem: MDD (major depressive disorder), recurrent, severe, with psychosis (HCC) Discharge Diagnoses: Principal Problem:   MDD (major depressive disorder), recurrent, severe, with psychosis (HCC) Active Problems:   Substance-induced disorder (HCC)   Insomnia   Anxiety state                                          HOSPITAL COURSE During the patient's hospitalization, patient had extensive initial psychiatric evaluation, and follow-up psychiatric evaluations every day. Psychiatric diagnoses provided upon initial assessment are listed as above.   Patient's psychiatric medications were adjusted on admission as follows: -Risperidone M-tabs 2 mg daily for mood stabilization & Psychosis  During the hospitalization, other adjustments were made to the patient's psychiatric medication regimen, with medications at discharge being as follows: -Risperdal M-tabs 1 mg in the mornings and 3 nightly for mood stabilization and  psychosis -Nicoderm transdermal patch 14 mg daily for nicotine dependence  Patient's care was discussed during the interdisciplinary team meeting every day during the hospitalization. The patient denies having side effects to prescribed psychiatric medication. Gradually, patient started adjusting to milieu. The patient was evaluated each day by a clinical provider to ascertain response to treatment. Improvement was noted by the patient's report of decreasing symptoms, improved sleep and appetite, affect, medication tolerance, behavior, and participation in unit programming.  Patient was asked each day to complete a self inventory noting mood, mental status, pain, new symptoms, anxiety and concerns.    Symptoms were reported as significantly decreased or resolved completely by discharge.  On day of discharge, the patient reports that their mood is stable. The patient denied having suicidal thoughts for more than 48 hours prior to discharge.  Patient denies having homicidal thoughts.  Patient denies having auditory hallucinations.  Patient denies any visual hallucinations or other symptoms of psychosis. The patient was motivated to continue taking medication with a goal of continued improvement in mental health.   The patient reports their target psychiatric symptoms of depression, anxiety and insomnia responded well to the psychiatric medications, and the patient reports overall benefit from this psychiatric hospitalization. Supportive psychotherapy was provided to the patient. The patient also participated in regular group therapy while hospitalized. Coping skills, problem solving as well as relaxation therapies were also part of the unit programming.  Labs were reviewed with the patient, and abnormal results were discussed with the patient. Repeated CBC due to elevated WBC of 15.4 on 12/21, repeated and was decreased to 15.4 .  Tox screen +THC. CMP reviewed. HA1C, unsure if this is true value has it might  not have been a fasting lab. Educated pt on the need to f/u with PCP regarding a repeat. TSH WNL. Vits B1, B12 WNL. baseline UA.  EKG for Qtc trending while on antipsychotics was 475, educated on the need to f/u with outpatient psychiatrist. Pt asymptomatic at time of discharge.  The patient is able to verbalize their individual safety plan to this provider.  # It is recommended to the patient to continue psychiatric medications as prescribed, after discharge from the hospital.    # It is recommended to the patient to follow up with your outpatient psychiatric provider and PCP.  # It was discussed with the patient, the impact of alcohol, drugs, tobacco have been there overall psychiatric and medical wellbeing, and total abstinence from substance use was recommended the patient.ed.  # Prescriptions provided or sent directly to preferred pharmacy at discharge. Patient agreeable to plan. Given opportunity to ask questions. Appears to feel comfortable with discharge.    # In the event of worsening symptoms, the patient is instructed to call the crisis hotline, 911 and or go to the nearest ED for appropriate evaluation and treatment of symptoms. To follow-up with primary care provider for other medical issues, concerns and or health care needs  # Patient was discharged to the [facility] treatment center with a plan to follow up as noted below.   Past Psychiatric History: See H & P  Past Medical History:  Past Medical History:  Diagnosis Date   MRSA (methicillin resistant Staphylococcus aureus)    Scoliosis    History reviewed. No pertinent surgical history. Family History:  Family History  Problem Relation Age of Onset   Diabetes Mother    Family Psychiatric  History: See H & P Social History:  Social History   Substance and Sexual Activity  Alcohol Use Yes   Comment: last drink was a week ago - drinks about a fifth of liquor 1-2 times a week     Social History   Substance and  Sexual Activity  Drug Use Yes   Types: Methamphetamines, Marijuana   Comment: last use for both was last week    Social History   Socioeconomic History   Marital status: Single    Spouse name: Not on file   Number of children: Not on file   Years of education: Not on file   Highest education level: Not on file  Occupational History   Not on file  Tobacco Use   Smoking status: Every Day    Packs/day: 2.00    Years: 13.00    Total pack years: 26.00    Types: Cigarettes   Smokeless tobacco: Never  Vaping Use   Vaping Use: Never used  Substance and Sexual Activity   Alcohol use: Yes    Comment: last drink was a week ago - drinks about a fifth of liquor 1-2 times a week   Drug use: Yes    Types: Methamphetamines, Marijuana    Comment: last use for both was last week   Sexual activity: Yes    Birth control/protection: None  Other Topics Concern   Not on file  Social History Narrative   Not on file   Social Determinants of Health   Financial Resource Strain: Not on file  Food Insecurity: Food Insecurity Present (01/28/2022)   Hunger Vital Sign    Worried About Running Out of Food in the Last  Year: Sometimes true    Ran Out of Food in the Last Year: Sometimes true  Transportation Needs: Unmet Transportation Needs (01/28/2022)   PRAPARE - Hydrologist (Medical): Yes    Lack of Transportation (Non-Medical): Yes  Physical Activity: Not on file  Stress: Not on file  Social Connections: Not on file   Physical Findings: AIMS: 0 CIWA:  n/a COWS: n/a     Musculoskeletal: Strength & Muscle Tone: within normal limits Gait & Station: normal Patient leans: N/A   Psychiatric Specialty Exam:  Presentation  General Appearance:  Appropriate for Environment; Casual  Eye Contact: Good  Speech: Clear and Coherent; Normal Rate  Speech Volume: Normal  Handedness: Right   Mood and Affect  Mood: Dysphoric  Affect: Appropriate;  Congruent   Thought Process  Thought Processes: Coherent; Goal Directed  Descriptions of Associations:Intact  Orientation:Full (Time, Place and Person)  Thought Content:Logical  History of Schizophrenia/Schizoaffective disorder:No  Duration of Psychotic Symptoms:Greater than six months  Hallucinations:Hallucinations: None  Ideas of Reference:None  Suicidal Thoughts:Suicidal Thoughts: No  Homicidal Thoughts:Homicidal Thoughts: No   Sensorium  Memory: Immediate Good; Recent Good  Judgment: Good  Insight: Good   Executive Functions  Concentration: Good  Attention Span: Good  Recall: Good  Fund of Knowledge: Good  Language: Good   Psychomotor Activity  Psychomotor Activity: Psychomotor Activity: Normal   Assets  Assets: Communication Skills; Resilience; Social Support   Sleep  Sleep: Sleep: Good Number of Hours of Sleep: 8.25    Physical Exam: Physical Exam Review of Systems  Constitutional: Negative.  Negative for fever.  HENT: Negative.    Eyes: Negative.   Respiratory: Negative.    Cardiovascular: Negative.  Negative for chest pain.  Gastrointestinal: Negative.   Genitourinary: Negative.   Musculoskeletal: Negative.   Skin: Negative.   Neurological: Negative.   Psychiatric/Behavioral:  Positive for depression (Denies SI, denies HI, denies AVH, denies paranoia and verbally contracts for safety outside of this [facility]) and substance abuse (Educated on the negative impact of susbtances of abuse on his mental health and verbalized understanding). Negative for hallucinations, memory loss and suicidal ideas. The patient is not nervous/anxious and does not have insomnia.    Blood pressure (!) 132/90, pulse (!) 108, temperature 98.1 F (36.7 C), temperature source Oral, resp. rate 16, height 5\' 5"  (1.651 m), weight 74.4 kg, SpO2 100 %. Body mass index is 27.29 kg/m.   Social History   Tobacco Use  Smoking Status Every Day    Packs/day: 2.00   Years: 13.00   Total pack years: 26.00   Types: Cigarettes  Smokeless Tobacco Never   Tobacco Cessation:  A prescription for an FDA-approved tobacco cessation medication provided at discharge   Blood Alcohol level:  Lab Results  Component Value Date   ETH <10 01/21/2022   ETH <10 50/27/7412    Metabolic Disorder Labs:  Lab Results  Component Value Date   HGBA1C 5.9 (H) 01/30/2022   MPG 123 01/30/2022   No results found for: "PROLACTIN" No results found for: "CHOL", "TRIG", "HDL", "CHOLHDL", "VLDL", "[place]"  See Psychiatric Specialty Exam and Suicide Risk Assessment completed by Attending Physician prior to discharge.  Discharge destination:  [place] treatment center  Is patient on multiple antipsychotic therapies at discharge:  No   Has Patient had three or more failed trials of antipsychotic monotherapy by history:  No  Recommended Plan for Multiple Antipsychotic Therapies: NA  Discharge Instructions     Diet -  low sodium heart healthy   Complete by: As directed    Increase activity slowly   Complete by: As directed       Allergies as of 02/04/2022   No Known Allergies      Medication List     TAKE these medications      Indication  nicotine 14 mg/24hr patch Commonly known as: NICODERM CQ - dosed in mg/24 hours Place 1 patch (14 mg total) onto the skin daily. Start taking on: February 05, 2022  Indication: Nicotine Addiction   risperiDONE 3 MG tablet Commonly known as: RISPERDAL Take 1 tablet (3 mg total) by mouth at bedtime.  Indication: Delusions   risperiDONE 1 MG tablet Commonly known as: RISPERDAL Take 1 tablet (1 mg total) by mouth daily. Start taking on: February 05, 2022  Indication: Delusions        Follow-up Information     [employer] Go to.   Why: [employer] will be providing you with transportation today @ 12:00PM. Contact information: [address] [place]   [address] [phone]                Starleen Blue, NP 02/04/2022, 4:52 PM

## 2022-02-04 NOTE — Progress Notes (Signed)
Pt discharged to lobby. Pt was stable and appreciative at that time. All papers, samples and prescriptions were given and valuables returned. Verbal understanding expressed. Denies SI/HI and A/VH. Pt given opportunity to express concerns and ask questions.  

## 2022-02-04 NOTE — Plan of Care (Signed)
Patient did not reach goal due to only attending one recreation therapy group session.   Shakeitha Umbaugh-McCall, LRT,CTRS

## 2022-02-04 NOTE — [facility] Suicide Risk Assessment (Signed)
Suicide Risk Assessment  Discharge Assessment    [facility] Discharge Suicide Risk Assessment   Principal Problem: MDD (major depressive disorder), recurrent, severe, with psychosis ([place]) Discharge Diagnoses: Principal Problem:   MDD (major depressive disorder), recurrent, severe, with psychosis ([place]) Active Problems:   Substance-induced disorder ([place])   Insomnia   Anxiety state   Total Time spent with patient: 20 minutes  During the patient's hospitalization, patient had extensive initial psychiatric evaluation, and follow-up psychiatric evaluations every day.  Psychiatric diagnoses provided upon initial assessment: Substance-induced disorder   Patient's psychiatric medications were adjusted on admission: Started on Risperdal.  During the hospitalization, other adjustments were made to the patient's psychiatric medication regimen: Risperdal was titrated and he responded well.  Gradually, patient started adjusting to milieu.   Patient's care was discussed during the interdisciplinary team meeting every day during the hospitalization.  The patient is not having side effects to prescribed psychiatric medication.  The patient reports their target psychiatric symptoms of hallucinations responded well to the psychiatric medications, and the patient reports overall benefit other psychiatric hospitalization. Supportive psychotherapy was provided to the patient. The patient also participated in regular group therapy while admitted.   Labs were reviewed with the patient, and abnormal results were discussed with the patient.  The patient denied having suicidal thoughts more than 48 hours prior to discharge.  Patient denies having homicidal thoughts.  Patient denies having auditory hallucinations.  Patient denies any visual hallucinations.  Patient denies having paranoid thoughts.  The patient is able to verbalize their individual safety plan to this provider.  It is recommended to the patient to  continue psychiatric medications as prescribed, after discharge from the hospital.    It is recommended to the patient to follow up with your outpatient psychiatric provider and PCP.  Discussed with the patient, the impact of alcohol, drugs, tobacco have been there overall psychiatric and medical wellbeing, and total abstinence from substance use was recommended the patient.    Musculoskeletal: Strength & Muscle Tone: within normal limits Gait & Station: normal Patient leans: N/A  Psychiatric Specialty Exam  Presentation  General Appearance:  Appropriate for Environment; Casual  Eye Contact: Good  Speech: Clear and Coherent; Normal Rate  Speech Volume: Normal  Handedness: Right   Mood and Affect  Mood: Dysphoric  Duration of Depression Symptoms: No data recorded Affect: Appropriate; Congruent   Thought Process  Thought Processes: Coherent; Goal Directed  Descriptions of Associations:Intact  Orientation:Full (Time, Place and Person)  Thought Content:Logical  History of Schizophrenia/Schizoaffective disorder:No  Duration of Psychotic Symptoms:Greater than six months  Hallucinations:Hallucinations: None  Ideas of Reference:None  Suicidal Thoughts:Suicidal Thoughts: No  Homicidal Thoughts:Homicidal Thoughts: No   Sensorium  Memory: Immediate Good; Recent Good  Judgment: Good  Insight: Good   Executive Functions  Concentration: Good  Attention Span: Good  Recall: Good  Fund of Knowledge: Good  Language: Good   Psychomotor Activity  Psychomotor Activity:Psychomotor Activity: Normal   Assets  Assets: Communication Skills; Resilience; Social Support   Sleep  Sleep:Sleep: Good Number of Hours of Sleep: 8.25   Physical Exam: Physical Exam Vitals and nursing note reviewed.  Constitutional:      General: He is not in acute distress.    Appearance: Normal appearance. He is normal weight. He is not ill-appearing or  toxic-appearing.  HENT:     Head: Normocephalic and atraumatic.  Pulmonary:     Effort: Pulmonary effort is normal.  Musculoskeletal:        General: Normal  range of motion.  Neurological:     General: No focal deficit present.     Mental Status: He is alert.    Review of Systems  Respiratory:  Negative for cough and shortness of breath.   Cardiovascular:  Negative for chest pain.  Gastrointestinal:  Negative for abdominal pain, constipation, diarrhea, nausea and vomiting.  Neurological:  Negative for dizziness, weakness and headaches.  Psychiatric/Behavioral:  Negative for depression, hallucinations and suicidal ideas. The patient is not nervous/anxious.    Blood pressure (!) 132/90, pulse (!) 108, temperature 98.1 F (36.7 C), temperature source Oral, resp. rate 16, height 5\' 5"  (1.651 m), weight 74.4 kg, SpO2 100 %. Body mass index is 27.29 kg/m.  Mental Status Per Nursing Assessment::   On Admission:  NA  Demographic Factors:  Male, Caucasian, Low socioeconomic status, and Living alone  Loss Factors: NA  Historical Factors: Family history of mental illness or substance abuse  Risk Reduction Factors:   Sense of responsibility to family and Positive social support  Continued Clinical Symptoms:  Alcohol/Substance Abuse/Dependencies  Cognitive Features That Contribute To Risk:  None    Suicide Risk:  Minimal: No identifiable suicidal ideation.  Patients presenting with no risk factors but with morbid ruminations; may be classified as minimal risk based on the severity of the depressive symptoms    Plan Of Care/Follow-up recommendations:  Activity: as tolerated  Diet: heart healthy  Other: -Follow-up with your outpatient psychiatric provider -instructions on appointment date, time, and address (location) are provided to you in discharge paperwork.  -Take your psychiatric medications as prescribed at discharge - instructions are provided to you in the discharge  paperwork  -Follow-up with outpatient primary care doctor and other specialists -for management of chronic medical disease, including: Substance Use  -Testing: Follow-up with outpatient provider for abnormal lab results: Low Vit D, slight elevation of A1c  -Recommend abstinence from alcohol, tobacco, and other illicit drug use at discharge.   -If your psychiatric symptoms recur, worsen, or if you have side effects to your psychiatric medications, call your outpatient psychiatric provider, 911, [phone] or go to the nearest emergency department.  -If suicidal thoughts recur, call your outpatient psychiatric provider, 911, [phone] or go to the nearest emergency department.   Briant Cedar, MD 02/04/2022, 10:16 AM

## 2022-02-22 ENCOUNTER — Other Ambulatory Visit: Payer: Self-pay

## 2022-02-22 ENCOUNTER — Emergency Department (HOSPITAL_COMMUNITY): Payer: 59

## 2022-02-22 ENCOUNTER — Inpatient Hospital Stay (HOSPITAL_COMMUNITY)
Admission: EM | Admit: 2022-02-22 | Discharge: 2022-02-24 | DRG: 917 | Disposition: A | Discharge status: Other Acute Inpatient Hospital | Payer: 59 | Attending: Family Medicine | Admitting: Family Medicine

## 2022-02-22 DIAGNOSIS — F17210 Nicotine dependence, cigarettes, uncomplicated: Secondary | ICD-10-CM | POA: Diagnosis not present

## 2022-02-22 DIAGNOSIS — R410 Disorientation, unspecified: Principal | ICD-10-CM

## 2022-02-22 DIAGNOSIS — F19159 Other psychoactive substance abuse with psychoactive substance-induced psychotic disorder, unspecified: Secondary | ICD-10-CM | POA: Diagnosis present

## 2022-02-22 DIAGNOSIS — R4182 Altered mental status, unspecified: Secondary | ICD-10-CM | POA: Diagnosis not present

## 2022-02-22 DIAGNOSIS — X31XXXA Exposure to excessive natural cold, initial encounter: Secondary | ICD-10-CM | POA: Diagnosis not present

## 2022-02-22 DIAGNOSIS — F333 Major depressive disorder, recurrent, severe with psychotic symptoms: Secondary | ICD-10-CM | POA: Diagnosis present

## 2022-02-22 DIAGNOSIS — Z1152 Encounter for screening for COVID-19: Secondary | ICD-10-CM | POA: Diagnosis not present

## 2022-02-22 DIAGNOSIS — T50901A Poisoning by unspecified drugs, medicaments and biological substances, accidental (unintentional), initial encounter: Secondary | ICD-10-CM | POA: Diagnosis not present

## 2022-02-22 DIAGNOSIS — G929 Unspecified toxic encephalopathy: Secondary | ICD-10-CM | POA: Diagnosis present

## 2022-02-22 DIAGNOSIS — Z833 Family history of diabetes mellitus: Secondary | ICD-10-CM | POA: Diagnosis not present

## 2022-02-22 DIAGNOSIS — F19129 Other psychoactive substance abuse with intoxication, unspecified: Secondary | ICD-10-CM | POA: Diagnosis present

## 2022-02-22 DIAGNOSIS — R9431 Abnormal electrocardiogram [ECG] [EKG]: Secondary | ICD-10-CM | POA: Diagnosis present

## 2022-02-22 DIAGNOSIS — R339 Retention of urine, unspecified: Secondary | ICD-10-CM | POA: Diagnosis present

## 2022-02-22 DIAGNOSIS — T68XXXA Hypothermia, initial encounter: Secondary | ICD-10-CM | POA: Diagnosis not present

## 2022-02-22 DIAGNOSIS — F1999 Other psychoactive substance use, unspecified with unspecified psychoactive substance-induced disorder: Secondary | ICD-10-CM | POA: Diagnosis present

## 2022-02-22 DIAGNOSIS — F319 Bipolar disorder, unspecified: Secondary | ICD-10-CM | POA: Diagnosis present

## 2022-02-22 DIAGNOSIS — Z79899 Other long term (current) drug therapy: Secondary | ICD-10-CM | POA: Diagnosis not present

## 2022-02-22 DIAGNOSIS — D72829 Elevated white blood cell count, unspecified: Secondary | ICD-10-CM | POA: Diagnosis present

## 2022-02-22 DIAGNOSIS — F1910 Other psychoactive substance abuse, uncomplicated: Secondary | ICD-10-CM | POA: Diagnosis present

## 2022-02-22 DIAGNOSIS — R69 Illness, unspecified: Secondary | ICD-10-CM | POA: Diagnosis not present

## 2022-02-22 LAB — URINALYSIS, ROUTINE W REFLEX MICROSCOPIC
Bacteria, UA: NONE SEEN
Bilirubin Urine: NEGATIVE
Glucose, UA: 50 mg/dL — AB
Hgb urine dipstick: NEGATIVE
Ketones, ur: 80 mg/dL — AB
Leukocytes,Ua: NEGATIVE
Nitrite: NEGATIVE
Protein, ur: 30 mg/dL — AB
Specific Gravity, Urine: 1.023 (ref 1.005–1.030)
pH: 5 (ref 5.0–8.0)

## 2022-02-22 LAB — CBC WITH DIFFERENTIAL/PLATELET
Abs Immature Granulocytes: 0.24 10*3/uL — ABNORMAL HIGH (ref 0.00–0.07)
Basophils Absolute: 0.1 10*3/uL (ref 0.0–0.1)
Basophils Relative: 0 %
Eosinophils Absolute: 0 10*3/uL (ref 0.0–0.5)
Eosinophils Relative: 0 %
HCT: 42.6 % (ref 39.0–52.0)
Hemoglobin: 14.2 g/dL (ref 13.0–17.0)
Immature Granulocytes: 1 %
Lymphocytes Relative: 5 %
Lymphs Abs: 1.5 10*3/uL (ref 0.7–4.0)
MCH: 29 pg (ref 26.0–34.0)
MCHC: 33.3 g/dL (ref 30.0–36.0)
MCV: 87.1 fL (ref 80.0–100.0)
Monocytes Absolute: 0.7 10*3/uL (ref 0.1–1.0)
Monocytes Relative: 3 %
Neutro Abs: 25.1 10*3/uL — ABNORMAL HIGH (ref 1.7–7.7)
Neutrophils Relative %: 91 %
Platelets: 334 10*3/uL (ref 150–400)
RBC: 4.89 MIL/uL (ref 4.22–5.81)
RDW: 13 % (ref 11.5–15.5)
WBC: 27.6 10*3/uL — ABNORMAL HIGH (ref 4.0–10.5)
nRBC: 0 % (ref 0.0–0.2)

## 2022-02-22 LAB — MRSA NEXT GEN BY PCR, NASAL: MRSA by PCR Next Gen: NOT DETECTED

## 2022-02-22 LAB — LACTIC ACID, PLASMA
Lactic Acid, Venous: 1.2 mmol/L (ref 0.5–1.9)
Lactic Acid, Venous: 0.8 mmol/L (ref 0.5–1.9)

## 2022-02-22 LAB — RESP PANEL BY RT-PCR (RSV, FLU A&B, COVID)  RVPGX2
Influenza A by PCR: NEGATIVE
Influenza B by PCR: NEGATIVE
Resp Syncytial Virus by PCR: NEGATIVE
SARS Coronavirus 2 by RT PCR: NEGATIVE

## 2022-02-22 LAB — COMPREHENSIVE METABOLIC PANEL
ALT: 38 U/L (ref 0–44)
AST: 55 U/L — ABNORMAL HIGH (ref 15–41)
Albumin: 4.9 g/dL (ref 3.5–5.0)
Alkaline Phosphatase: 96 U/L (ref 38–126)
Anion gap: 14 (ref 5–15)
BUN: 19 mg/dL (ref 6–20)
CO2: 24 mmol/L (ref 22–32)
Calcium: 8.7 mg/dL — ABNORMAL LOW (ref 8.9–10.3)
Chloride: 98 mmol/L (ref 98–111)
Creatinine, Ser: 0.89 mg/dL (ref 0.61–1.24)
GFR, Estimated: >60 mL/min (ref >60)
Glucose, Bld: 139 mg/dL — ABNORMAL HIGH (ref 70–99)
Potassium: 4.3 mmol/L (ref 3.5–5.1)
Sodium: 136 mmol/L (ref 135–145)
Total Bilirubin: 0.9 mg/dL (ref 0.3–1.2)
Total Protein: 8.6 g/dL — ABNORMAL HIGH (ref 6.5–8.1)

## 2022-02-22 LAB — APTT: aPTT: 38 seconds — ABNORMAL HIGH (ref 24–36)

## 2022-02-22 LAB — ETHANOL: Alcohol, Ethyl (B): <10 mg/dL (ref <10)

## 2022-02-22 LAB — RAPID URINE DRUG SCREEN, HOSP PERFORMED
Amphetamines: POSITIVE — AB
Barbiturates: NOT DETECTED
Benzodiazepines: NOT DETECTED
Cocaine: POSITIVE — AB
Opiates: NOT DETECTED
Tetrahydrocannabinol: POSITIVE — AB

## 2022-02-22 LAB — CBG MONITORING, ED: Glucose-Capillary: 123 mg/dL — ABNORMAL HIGH (ref 70–99)

## 2022-02-22 LAB — MAGNESIUM: Magnesium: 2.2 mg/dL (ref 1.7–2.4)

## 2022-02-22 LAB — PROTIME-INR
INR: 1.1 (ref 0.8–1.2)
Prothrombin Time: 14.2 seconds (ref 11.4–15.2)

## 2022-02-22 MED ORDER — VANCOMYCIN HCL 1250 MG/250ML IV SOLN
1250.0000 mg | Freq: Two times a day (BID) | INTRAVENOUS | Status: DC
  Administered 2022-02-23: 1250 mg via INTRAVENOUS
  Filled 2022-02-22: qty 250

## 2022-02-22 MED ORDER — SODIUM CHLORIDE 0.9 % IV BOLUS
2000.0000 mL | Freq: Once | INTRAVENOUS | Status: AC
  Administered 2022-02-22: 2000 mL via INTRAVENOUS

## 2022-02-22 MED ORDER — LACTATED RINGERS IV SOLN: INTRAVENOUS | Status: DC

## 2022-02-22 MED ORDER — SODIUM CHLORIDE 0.9 % IV SOLN
2.0000 g | Freq: Once | INTRAVENOUS | Status: AC
  Administered 2022-02-22: 2 g via INTRAVENOUS
  Filled 2022-02-22: qty 12.5

## 2022-02-22 MED ORDER — VANCOMYCIN HCL IN DEXTROSE 1-5 GM/200ML-% IV SOLN
500.0000 mg | Freq: Once | INTRAVENOUS | Status: AC
  Administered 2022-02-22: 500 mg via INTRAVENOUS
  Filled 2022-02-22: qty 200

## 2022-02-22 MED ORDER — NALOXONE HCL 2 MG/2ML IJ SOSY
2.0000 mg | PREFILLED_SYRINGE | Freq: Once | INTRAMUSCULAR | Status: AC
  Administered 2022-02-22: 2 mg via INTRAVENOUS
  Filled 2022-02-22: qty 2

## 2022-02-22 MED ORDER — NALOXONE HCL 0.4 MG/ML IJ SOLN
2.0000 mg | Freq: Once | INTRAMUSCULAR | Status: DC
  Filled 2022-02-22: qty 5

## 2022-02-22 MED ORDER — SODIUM CHLORIDE 0.9 % IV SOLN
2.0000 g | Freq: Three times a day (TID) | INTRAVENOUS | Status: DC
  Administered 2022-02-22 – 2022-02-23 (×2): 2 g via INTRAVENOUS
  Filled 2022-02-22 (×2): qty 12.5

## 2022-02-22 MED ORDER — METRONIDAZOLE 500 MG/100ML IV SOLN
500.0000 mg | Freq: Once | INTRAVENOUS | Status: AC
  Administered 2022-02-22: 500 mg via INTRAVENOUS
  Filled 2022-02-22: qty 100

## 2022-02-22 MED ORDER — ACETAMINOPHEN 650 MG RE SUPP: 650.0000 mg | Freq: Four times a day (QID) | RECTAL | Status: DC | PRN

## 2022-02-22 MED ORDER — HYDRALAZINE HCL 20 MG/ML IJ SOLN: 5.0000 mg | INTRAMUSCULAR | Status: DC | PRN

## 2022-02-22 MED ORDER — ACETAMINOPHEN 325 MG PO TABS
650.0000 mg | ORAL_TABLET | Freq: Four times a day (QID) | ORAL | Status: DC | PRN
  Administered 2022-02-23: 650 mg via ORAL
  Filled 2022-02-22 (×2): qty 2

## 2022-02-22 MED ORDER — HALOPERIDOL LACTATE 5 MG/ML IJ SOLN
5.0000 mg | Freq: Once | INTRAMUSCULAR | Status: AC
  Administered 2022-02-22: 5 mg via INTRAVENOUS
  Filled 2022-02-22: qty 1

## 2022-02-22 MED ORDER — VANCOMYCIN HCL 750 MG/150ML IV SOLN
750.0000 mg | Freq: Once | INTRAVENOUS | Status: DC
  Filled 2022-02-22: qty 150

## 2022-02-22 MED ORDER — ENOXAPARIN SODIUM 40 MG/0.4ML IJ SOSY
40.0000 mg | PREFILLED_SYRINGE | INTRAMUSCULAR | Status: DC
  Administered 2022-02-22: 40 mg via SUBCUTANEOUS
  Filled 2022-02-22 (×2): qty 0.4

## 2022-02-22 MED ORDER — VANCOMYCIN HCL IN DEXTROSE 1-5 GM/200ML-% IV SOLN
1000.0000 mg | Freq: Once | INTRAVENOUS | Status: AC
  Administered 2022-02-22: 1000 mg via INTRAVENOUS
  Filled 2022-02-22: qty 200

## 2022-02-22 NOTE — ED Notes (Signed)
Pt's [employer] [phone]

## 2022-02-22 NOTE — Sepsis Progress Note (Signed)
[facility] following code sepsis

## 2022-02-22 NOTE — ED Provider Notes (Signed)
[facility] Provider Note   CSN: [id number] Arrival date & time: 02/22/22  1245     History  Chief Complaint  Patient presents with   Altered Mental Status    Roger [name] is a 32 y.o. male.  Patient was found outside sitting on a bench when it was really cold.  Patient with confused.  Patient has a history of substance abuse.  The history is provided by the EMS personnel. No language interpreter was used.  Altered Mental Status Presenting symptoms: behavior changes and confusion   Severity:  Severe Most recent episode:  Today Episode history:  Single Timing:  Constant Progression:  Unchanged Chronicity:  New Context: drug use        Home Medications Prior to Admission medications   Medication Sig Start Date End Date Taking? Authorizing Provider  nicotine (NICODERM CQ - DOSED IN MG/24 HOURS) 14 mg/24hr patch Place 1 patch (14 mg total) onto the skin daily. 02/05/22   Briant Cedar, MD  risperiDONE (RISPERDAL) 1 MG tablet Take 1 tablet (1 mg total) by mouth daily. 02/05/22   Briant Cedar, MD  risperiDONE (RISPERDAL) 3 MG tablet Take 1 tablet (3 mg total) by mouth at bedtime. 02/04/22   Briant Cedar, MD      Allergies    Patient has no known allergies.    Review of Systems   Review of Systems  Unable to perform ROS: Mental status change  Psychiatric/Behavioral:  Positive for confusion.     Physical Exam Updated Vital Signs BP 112/68   Pulse 67   Temp (!) 95 F (35 C)   Resp 11   Ht 5\' 5"  (1.651 m)   Wt 83.4 kg   SpO2 99%   BMI 30.60 kg/m  Physical Exam Vitals and nursing note reviewed.  Constitutional:      Appearance: He is well-developed.  HENT:     Head: Normocephalic.     Nose: Nose normal.  Eyes:     General: No scleral icterus.    Conjunctiva/sclera: Conjunctivae normal.  Neck:     Thyroid: No thyromegaly.  Cardiovascular:     Rate and Rhythm: Normal rate and regular  rhythm.     Heart sounds: No murmur heard.    No friction rub. No gallop.  Pulmonary:     Breath sounds: No stridor. No wheezing or rales.  Chest:     Chest wall: No tenderness.  Abdominal:     General: There is no distension.     Tenderness: There is no abdominal tenderness. There is no rebound.  Musculoskeletal:        General: Normal range of motion.     Cervical back: Neck supple.  Lymphadenopathy:     Cervical: No cervical adenopathy.  Skin:    Findings: No erythema or rash.  Neurological:     Mental Status: He is alert.     Motor: No abnormal muscle tone.     Coordination: Coordination normal.     Comments: Patient oriented to person only  Psychiatric:        Behavior: Behavior normal.     ED Results / Procedures / Treatments   Labs (all labs ordered are listed, but only abnormal results are displayed) Labs Reviewed  COMPREHENSIVE METABOLIC PANEL - Abnormal; Notable for the following components:      Result Value   Glucose, Bld 139 (*)    Calcium 8.7 (*)  Total Protein 8.6 (*)    AST 55 (*)    All other components within normal limits  CBC WITH DIFFERENTIAL/PLATELET - Abnormal; Notable for the following components:   WBC 27.6 (*)    Neutro Abs 25.1 (*)    Abs Immature Granulocytes 0.24 (*)    All other components within normal limits  APTT - Abnormal; Notable for the following components:   aPTT 38 (*)    All other components within normal limits  URINALYSIS, ROUTINE W REFLEX MICROSCOPIC - Abnormal; Notable for the following components:   Glucose, UA 50 (*)    Ketones, ur 80 (*)    Protein, ur 30 (*)    All other components within normal limits  RAPID URINE DRUG SCREEN, HOSP PERFORMED - Abnormal; Notable for the following components:   Cocaine POSITIVE (*)    Amphetamines POSITIVE (*)    Tetrahydrocannabinol POSITIVE (*)    All other components within normal limits  CBG MONITORING, ED - Abnormal; Notable for the following components:    Glucose-Capillary 123 (*)    All other components within normal limits  CULTURE, BLOOD (ROUTINE X 2)  CULTURE, BLOOD (ROUTINE X 2)  RESP PANEL BY RT-PCR (RSV, FLU A&B, COVID)  RVPGX2  URINE CULTURE  LACTIC ACID, PLASMA  PROTIME-INR  ETHANOL  LACTIC ACID, PLASMA  MAGNESIUM    EKG None  Radiology DG Chest Port 1 View  Result Date: 02/22/2022 CLINICAL DATA:  Possible sepsis.  Confusion. EXAM: PORTABLE CHEST 1 VIEW COMPARISON:  10/11/2011 FINDINGS: Patient is slightly rotated to the left as artifact overlies the mid and left upper chest. Lungs are hypoinflated without focal airspace consolidation or effusion. Cardiomediastinal silhouette and remainder of the exam is unremarkable. IMPRESSION: Hypoinflation without acute cardiopulmonary disease. Electronically Signed   By: Marin Olp M.D.   On: 02/22/2022 15:12   CT Head Wo Contrast  Result Date: 02/22/2022 CLINICAL DATA:  Mental status change, unknown cause EXAM: CT HEAD WITHOUT CONTRAST TECHNIQUE: Contiguous axial images were obtained from the base of the skull through the vertex without intravenous contrast. RADIATION DOSE REDUCTION: This exam was performed according to the departmental dose-optimization program which includes automated exposure control, adjustment of the mA and/or kV according to patient size and/or use of iterative reconstruction technique. COMPARISON:  07/19/2021 FINDINGS: Brain: No evidence of acute infarction, hemorrhage, hydrocephalus, extra-axial collection or mass lesion/mass effect. Vascular: No hyperdense vessel or unexpected calcification. Skull: Normal. Negative for fracture or focal lesion. Sinuses/Orbits: No acute finding.  Chronic ethmoid sinusitis noted. IMPRESSION: No acute intracranial process. Electronically Signed   By: Sammie Bench M.D.   On: 02/22/2022 15:02    Procedures Procedures    Medications Ordered in ED Medications  ceFEPIme (MAXIPIME) 2 g in sodium chloride 0.9 % 100 mL IVPB (has no  administration in time range)  vancomycin (VANCOREADY) IVPB 1250 mg/250 mL (has no administration in time range)  vancomycin (VANCOCIN) IVPB 1000 mg/200 mL premix (has no administration in time range)  enoxaparin (LOVENOX) injection 40 mg (has no administration in time range)  acetaminophen (TYLENOL) tablet 650 mg (has no administration in time range)    Or  acetaminophen (TYLENOL) suppository 650 mg (has no administration in time range)  hydrALAZINE (APRESOLINE) injection 5 mg (has no administration in time range)  lactated ringers infusion (has no administration in time range)  ceFEPIme (MAXIPIME) 2 g in sodium chloride 0.9 % 100 mL IVPB (0 g Intravenous Stopped 02/22/22 1505)  metroNIDAZOLE (FLAGYL) IVPB 500 mg (0  mg Intravenous Stopped 02/22/22 1505)  vancomycin (VANCOCIN) IVPB 1000 mg/200 mL premix (0 mg Intravenous Stopped 02/22/22 1506)  sodium chloride 0.9 % bolus 2,000 mL (2,000 mLs Intravenous New Bag/Given 02/22/22 1329)  haloperidol lactate (HALDOL) injection 5 mg (5 mg Intravenous Given 02/22/22 1323)  naloxone (NARCAN) injection 2 mg (2 mg Intravenous Given 02/22/22 1529)    ED Course/ Medical Decision Making/ A&P  CRITICAL CARE Performed by: Bethann Berkshire Total critical care time: 45 minutes Critical care time was exclusive of separately billable procedures and treating other patients. Critical care was necessary to treat or prevent imminent or life-threatening deterioration. Critical care was time spent personally by me on the following activities: development of treatment plan with patient and/or surrogate as well as nursing, discussions with consultants, evaluation of patient's response to treatment, examination of patient, obtaining history from patient or surrogate, ordering and performing treatments and interventions, ordering and review of laboratory studies, ordering and review of radiographic studies, pulse oximetry and re-evaluation of patient's condition.  This patient  presents to the ED for concern of altered mental status, this involves an extensive number of treatment options, and is a complaint that carries with it a high risk of complications and morbidity.  The differential diagnosis includes drug abuse, hypothermia   Co morbidities that complicate the patient evaluation  Drug abuse   Additional history obtained:  Additional history obtained from paramedics External records from outside source obtained and reviewed including hospital records   Lab Tests:  I Ordered, and personally interpreted labs.  The pertinent results include: Drug screen positive for amphetamines cocaine and marijuana   Imaging Studies ordered:  I ordered imaging studies including chest x-ray and CT head I independently visualized and interpreted imaging which showed unremarkable I agree with the radiologist interpretation   Cardiac Monitoring: / EKG:  The patient was maintained on a cardiac monitor.  I personally viewed and interpreted the cardiac monitored which showed an underlying rhythm of: Normal sinus rhythm   Consultations Obtained:  I requested consultation with the hospitalist,  and discussed lab and imaging findings as well as pertinent plan - they recommend: Admit   Problem List / ED Course / Critical interventions / Medication management  Altered mental status and hypothermia I ordered medication including sepsis protocol with antibiotics and fluids and warming blanket Reevaluation of the patient after these medicines showed that the patient improved I have reviewed the patients home medicines and have made adjustments as needed   Social Determinants of Health:  Drug abuse   Test / Admission - Considered:  None Patient with hypothermia.  Sepsis protocol was started.  Along with warming blanket and warm fluids.  His temperature came up.  Patient was given 5 of Haldol initially because he was combative.  Suspect substance abuse and  hypothermia                           Medical Decision Making Amount and/or Complexity of Data Reviewed Labs: ordered. Radiology: ordered. ECG/medicine tests: ordered.  Risk Prescription drug management. Decision regarding hospitalization.   Patient with altered mental status and hypothermia.  He will be admitted to medicine        Final Clinical Impression(s) / ED Diagnoses Final diagnoses:  Confusion  Hypothermia, initial encounter    Rx / DC Orders ED Discharge Orders     None         Bethann Berkshire, MD 02/25/22 1016

## 2022-02-22 NOTE — Progress Notes (Signed)
Pharmacy Antibiotic Note  Roger [name] is a 32 y.o. male admitted on 02/22/2022 with  possible sepsis of unknown source . Pharmacy has been consulted for vancomycin and cefepime dosing.  Patient hypothermic to 85.5, wbc elevated at 27.6, renal function appears normal. Lactic acid 1.2.   Vancomycin 1250 mg IV Q 12 hrs. Goal AUC 400-550. Expected AUC: 459 SCr used: 0.89   Plan: Vancomycin 1250 IV every 12 hours.  Goal trough 15-20 mcg/mL. Cefepime 2g q8 hours Follow up renal function and culture data  Height: 5\' 5"  (165.1 cm) Weight: 83.4 kg (183 lb 13.8 oz) IBW/kg (Calculated) : 61.5  Temp (24hrs), Avg:85.5 F (29.7 C), Min:85.5 F (29.7 C), Max:85.5 F (29.7 C)  Recent Labs  Lab 02/22/22 1330  WBC 27.6*  CREATININE 0.89    Estimated Creatinine Clearance: 119.6 mL/min (by C-G formula based on SCr of 0.89 mg/dL).    No Known Allergies  Thank you for allowing pharmacy to be a part of this patient's care.  Erin Hearing PharmD., BCPS Clinical Pharmacist 02/22/2022 2:09 PM

## 2022-02-22 NOTE — ED Triage Notes (Signed)
Pt believed to be homeless. Confused and non-cooperative. Skin very cool to the touch. Hungry. Pulse 50 - 60. B/P 160/80. Speech slightly slurred. Sitting on a bench outside someone's house. They called EMS. Aunt reports no history of seizures, but pt reports he does have seizures. Long history of poly-substance abuse.   BS on ED arrival 123. BS PTA 50's. EMS gave 250 cc of D10 prior to arrival.

## 2022-02-22 NOTE — Progress Notes (Signed)
Talked to aunt.

## 2022-02-22 NOTE — H&P (Signed)
[facility] H&P   Patient Demographics:    Roger [name], is a 32 y.o. male  MRN: [id number]   DOB - August 23, 1990  Admit Date - 02/22/2022  Outpatient Primary MD for the patient is Pcp, No  Referring MD/NP/PA: Dr Estell Harpin    Chief Complaint  Patient presents with   Altered Mental Status      HPI:    Roger [name]  is a 32 y.o. male, with past medical history of polysubstance abuse including IV drug abuse, substance-induced psychotic disorder, bipolar disorder, depression, severe, with psychosis, patient recent hospitalization at behavioral health where he was discharged 1/2, apparently he went for residential substance abuse program in [place], patient is altered cannot provide any history, it was obtained with aunt by phone, patient was discharged from facility couple days ago given he had fever with some other reasons as well, patient mated to [facility], but apparently he went missing secondary due to drug abuse, and he was found in someone's yard obtundent where EMS were called.  His temperature on the scene was 73 Fahrenheit -In ED patient was hypothermic at 5, up from 81 at the scene, currently at 95, workup significant for leukocytosis at 27 K and normal UA, lactic acid normal at 1.2, CT head with no acute findings, urine drug screen is positive for THC, amphetamine and cane, patient had no response to 2 mg of Narcan given in ED, [facility] hospitalist consulted to admit.    Review of systems:  Cannot obtain any appropriate review of system currently given his altered mental status   With Past History of the following :    Past Medical History:  Diagnosis Date   MRSA (methicillin resistant Staphylococcus aureus)    Scoliosis       No past surgical history on file.    Social History:     Social History   Tobacco Use   Smoking status: Every Day    Packs/day: 2.00    Years:  13.00    Total pack years: 26.00    Types: Cigarettes   Smokeless tobacco: Never  Substance Use Topics   Alcohol use: Yes    Comment: last drink was a week ago - drinks about a fifth of liquor 1-2 times a week       Family History :     Family History  Problem Relation Age of Onset   Diabetes Mother       Home Medications:   Prior to Admission medications   Medication Sig Start Date End Date Taking? Authorizing Provider  nicotine (NICODERM CQ - DOSED IN MG/24 HOURS) 14 mg/24hr patch Place 1 patch (14 mg total) onto the skin daily. 02/05/22   Lauro Franklin, MD  risperiDONE (RISPERDAL) 1 MG tablet Take 1 tablet (1 mg total) by mouth daily. 02/05/22   Lauro Franklin, MD  risperiDONE (RISPERDAL) 3 MG tablet Take 1  tablet (3 mg total) by mouth at bedtime. 02/04/22   Briant Cedar, MD     Allergies:    No Known Allergies   Physical Exam:   Vitals  Blood pressure 112/68, pulse 67, temperature (!) 95 F (35 C), resp. rate 11, height 5\' 5"  (1.651 m), weight 83.4 kg, SpO2 99 %.   1. General sleeping comfortably, on Bair hugger, no apparent distress  2.  He opens eyes intermittently, mumbles few words and then go back to sleep  3. No F.N deficits, ALL C.Nerves Intact, to be moving all extremities without significant deficits  4. Ears and Eyes appear Normal, Conjunctivae clear, PERRLA. Moist Oral Mucosa.  5. Supple Neck, No JVD, No cervical lymphadenopathy appriciated, No Carotid Bruits.  6. Symmetrical Chest wall movement, Good air movement bilaterally, CTAB.  7. RRR, No Gallops, Rubs or Murmurs, No Parasternal Heave.  8. Positive Bowel Sounds, Abdomen Soft, No tenderness, No organomegaly appriciated,No rebound -guarding or rigidity.  9.  No Cyanosis, Normal Skin Turgor, No Skin Rash or Bruise.  10. Good muscle tone,  joints appear normal , no effusions, Normal ROM.   Data Review:    CBC Recent Labs  Lab 02/22/22 1330  WBC 27.6*  HGB 14.2  HCT  42.6  PLT 334  MCV 87.1  MCH 29.0  MCHC 33.3  RDW 13.0  LYMPHSABS 1.5  MONOABS 0.7  EOSABS 0.0  BASOSABS 0.1   ------------------------------------------------------------------------------------------------------------------  Chemistries  Recent Labs  Lab 02/22/22 1330  NA 136  K 4.3  CL 98  CO2 24  GLUCOSE 139*  BUN 19  CREATININE 0.89  CALCIUM 8.7*  AST 55*  ALT 38  ALKPHOS 96  BILITOT 0.9   ------------------------------------------------------------------------------------------------------------------ estimated creatinine clearance is 119.6 mL/min (by C-G formula based on SCr of 0.89 mg/dL). ------------------------------------------------------------------------------------------------------------------ No results for input(s): "TSH", "T4TOTAL", "T3FREE", "THYROIDAB" in the last 72 hours.  Invalid input(s): "FREET3"  Coagulation profile Recent Labs  Lab 02/22/22 1330  INR 1.1   ------------------------------------------------------------------------------------------------------------------- No results for input(s): "DDIMER" in the last 72 hours. -------------------------------------------------------------------------------------------------------------------  Cardiac Enzymes No results for input(s): "CKMB", "TROPONINI", "MYOGLOBIN" in the last 168 hours.  Invalid input(s): "CK" ------------------------------------------------------------------------------------------------------------------ No results found for: "BNP"   ---------------------------------------------------------------------------------------------------------------  Urinalysis    Component Value Date/Time   COLORURINE YELLOW 02/22/2022 1400   APPEARANCEUR CLEAR 02/22/2022 1400   LABSPEC 1.023 02/22/2022 1400   PHURINE 5.0 02/22/2022 1400   GLUCOSEU 50 (A) 02/22/2022 1400   HGBUR NEGATIVE 02/22/2022 1400   BILIRUBINUR NEGATIVE 02/22/2022 1400   KETONESUR 80 (A) 02/22/2022 1400    PROTEINUR 30 (A) 02/22/2022 1400   UROBILINOGEN 0.2 10/11/2011 2111   NITRITE NEGATIVE 02/22/2022 1400   LEUKOCYTESUR NEGATIVE 02/22/2022 1400    ----------------------------------------------------------------------------------------------------------------   Imaging Results:    DG Chest Port 1 View  Result Date: 02/22/2022 CLINICAL DATA:  Possible sepsis.  Confusion. EXAM: PORTABLE CHEST 1 VIEW COMPARISON:  10/11/2011 FINDINGS: Patient is slightly rotated to the left as artifact overlies the mid and left upper chest. Lungs are hypoinflated without focal airspace consolidation or effusion. Cardiomediastinal silhouette and remainder of the exam is unremarkable. IMPRESSION: Hypoinflation without acute cardiopulmonary disease. Electronically Signed   By: Marin Olp M.D.   On: 02/22/2022 15:12   CT Head Wo Contrast  Result Date: 02/22/2022 CLINICAL DATA:  Mental status change, unknown cause EXAM: CT HEAD WITHOUT CONTRAST TECHNIQUE: Contiguous axial images were obtained from the base of the skull through the vertex without intravenous contrast. RADIATION DOSE  REDUCTION: This exam was performed according to the departmental dose-optimization program which includes automated exposure control, adjustment of the mA and/or kV according to patient size and/or use of iterative reconstruction technique. COMPARISON:  07/19/2021 FINDINGS: Brain: No evidence of acute infarction, hemorrhage, hydrocephalus, extra-axial collection or mass lesion/mass effect. Vascular: No hyperdense vessel or unexpected calcification. Skull: Normal. Negative for fracture or focal lesion. Sinuses/Orbits: No acute finding.  Chronic ethmoid sinusitis noted. IMPRESSION: No acute intracranial process. Electronically Signed   By: Sammie Bench M.D.   On: 02/22/2022 15:02    My personal review of EKG:   Vent. rate 83 BPM PR interval 147 ms QRS duration 99 ms QT/QTcB 442/520 ms P-R-T axes 65 83 51 Sinus rhythm Probable  inferior infarct, old Prolonged QTc  Assessment & Plan:    Principal Problem:   Toxic encephalopathy Active Problems:   Substance-induced disorder (HCC)   MDD (major depressive disorder), recurrent, severe, with psychosis ([place])   Drug abuse ([place])   Hypothermia -Exposure to the elements, from drug overdose and being in the outside and very cold weather. -Proving with [employer], will admit to ICU continue with Bair hugger  Polysubstance abuse Drug overdose -Patient tested positive for THC, cocaine and vitamin -Response to Narcan -He was discharged from detox center in [place], as discussed with aunt, they are willing to accept him back whenever he is discharged from our facility, will consult TOC to arrange for that upon discharge.  Acute toxic encephalopathy -Due to above, no acute finding on imaging  Prolonged QTc -check magnesium level, monitor on telemetry, and avoid prolonging agents  Leukocytosis -Stress related from above, but with history of IV drug abuse will keep empirically on IV antibiotics and follow-up blood cultures  MDD, recurrent, severe with psychosis Drug-induced psychosis -given his  prolonged QTc we will hold risperidone for now     DVT Prophylaxis Lovenox  AM Labs Ordered, also please review Full Orders  Family Communication: Admission, patients condition and plan of care including tests being ordered have been discussed with the patient and aunt Freida Busman [phone] who indicate understanding and agree with the plan and Code Status.  Code Status Full  Likely DC to  home  Condition GUARDED    Consults called: none    Admission status: inpatient    Time spent in minutes : 70 minutes   Phillips Climes M.D on 02/22/2022 at 3:36 PM   [facility] - Office  [phone]

## 2022-02-23 DIAGNOSIS — T68XXXD Hypothermia, subsequent encounter: Secondary | ICD-10-CM

## 2022-02-23 DIAGNOSIS — R338 Other retention of urine: Secondary | ICD-10-CM

## 2022-02-23 LAB — CBC
HCT: 37.9 % — ABNORMAL LOW (ref 39.0–52.0)
Hemoglobin: 12.6 g/dL — ABNORMAL LOW (ref 13.0–17.0)
MCH: 29 pg (ref 26.0–34.0)
MCHC: 33.2 g/dL (ref 30.0–36.0)
MCV: 87.1 fL (ref 80.0–100.0)
Platelets: 288 10*3/uL (ref 150–400)
RBC: 4.35 MIL/uL (ref 4.22–5.81)
RDW: 13.2 % (ref 11.5–15.5)
WBC: 12.6 10*3/uL — ABNORMAL HIGH (ref 4.0–10.5)
nRBC: 0 % (ref 0.0–0.2)

## 2022-02-23 LAB — BASIC METABOLIC PANEL
Anion gap: 9 (ref 5–15)
BUN: 14 mg/dL (ref 6–20)
CO2: 21 mmol/L — ABNORMAL LOW (ref 22–32)
Calcium: 7.9 mg/dL — ABNORMAL LOW (ref 8.9–10.3)
Chloride: 106 mmol/L (ref 98–111)
Creatinine, Ser: 0.82 mg/dL (ref 0.61–1.24)
GFR, Estimated: >60 mL/min (ref >60)
Glucose, Bld: 72 mg/dL (ref 70–99)
Potassium: 3.7 mmol/L (ref 3.5–5.1)
Sodium: 136 mmol/L (ref 135–145)

## 2022-02-23 MED ORDER — RISPERIDONE 1 MG PO TABS
3.0000 mg | ORAL_TABLET | Freq: Every day | ORAL | Status: DC
  Administered 2022-02-23: 3 mg via ORAL
  Filled 2022-02-23: qty 3

## 2022-02-23 MED ORDER — CHLORHEXIDINE GLUCONATE CLOTH 2 % EX PADS: 6.0000 | MEDICATED_PAD | Freq: Every day | CUTANEOUS | Status: DC

## 2022-02-23 MED ORDER — RISPERIDONE 1 MG PO TABS
1.0000 mg | ORAL_TABLET | Freq: Every day | ORAL | Status: DC
  Administered 2022-02-23 – 2022-02-24 (×2): 1 mg via ORAL
  Filled 2022-02-23 (×2): qty 1

## 2022-02-23 MED ORDER — DOXYCYCLINE HYCLATE 100 MG PO TABS
100.0000 mg | ORAL_TABLET | Freq: Two times a day (BID) | ORAL | Status: DC
  Administered 2022-02-23 – 2022-02-24 (×2): 100 mg via ORAL
  Filled 2022-02-23 (×2): qty 1

## 2022-02-23 MED ORDER — NICOTINE 14 MG/24HR TD PT24
14.0000 mg | MEDICATED_PATCH | Freq: Every day | TRANSDERMAL | Status: DC
  Administered 2022-02-23 – 2022-02-24 (×2): 14 mg via TRANSDERMAL
  Filled 2022-02-23 (×2): qty 1

## 2022-02-23 MED ORDER — LORAZEPAM 1 MG PO TABS: 1.0000 mg | ORAL_TABLET | Freq: Four times a day (QID) | ORAL | Status: DC | PRN

## 2022-02-23 NOTE — Hospital Course (Signed)
32 y.o. male with past medical history of polysubstance abuse including IV drug abuse, substance-induced psychotic disorder, major depression, severe, with psychosis, status post recent hospitalization at [facility] behavioral health hospital where he was discharged 02/04/22. He subsequently enrolled in a residential substance abuse program in [place], [place].  On presentation,  patient is altered cannot provide any history.  This history was obtained with aunt by phone.  Reportedly patient was discharged from [facility] facility a couple days ago given due to fever and unknown other reasons.   Patient returned to [place], [place] but apparently he went missing secondary due to drug abuse.  He was found in someone's yard obtunded where EMS were called.  His temperature on the scene was 81 degrees Fahrenheit.   -In ED patient was hypothermic at 85 degrees, up from 81 degrees at the scene.  Medical workup was significant for leukocytosis at 27 K and normal UA, lactic acid normal at 1.2, CT head with no acute findings, urine drug screen is positive for THC, amphetamine, cocaine, negative for opiates, patient had no response to 2 mg of Narcan given in ED, [facility] hospitalists consulted to admit.

## 2022-02-23 NOTE — Progress Notes (Signed)
  Transition of Care [facility]) Screening Note   Patient Details  Name: MCKAY TEGTMEYER Date of Birth: 12-Sep-1990   Transition of Care [facility]) CM/SW Contact:    Iona Beard, [place] Phone Number: 02/23/2022, 11:42 AM    Transition of Care Department [facility]) has reviewed patient and no TOC needs have been identified at this time. We will continue to monitor patient advancement through interdisciplinary progression rounds. If new patient transition needs arise, please place a TOC consult.

## 2022-02-23 NOTE — Progress Notes (Addendum)
PROGRESS NOTE   Roger [name]  FTD:[id number] DOB: 1990/03/27 DOA: 02/22/2022 PCP: Pcp, No   Chief Complaint  Patient presents with   Altered Mental Status   Level of care: Med-Surg  Brief Admission History:  32 y.o. male with past medical history of polysubstance abuse including IV drug abuse, substance-induced psychotic disorder, major depression, severe, with psychosis, status post recent hospitalization at [facility] behavioral [facility] hospital where he was discharged 02/04/22. He subsequently enrolled in a residential substance abuse program in [place], [place].  On presentation,  patient is altered cannot provide any history.  This history was obtained with aunt by phone.  Reportedly patient was discharged from [facility] facility a couple days ago given due to fever and unknown other reasons.   Patient returned to [place], [place] but apparently he went missing secondary due to drug abuse.  He was found in someone's yard obtunded where EMS were called.  His temperature on the scene was 81 degrees Fahrenheit.   -In ED patient was hypothermic at 85 degrees, up from 81 degrees at the scene.  Medical workup was significant for leukocytosis at 27 K and normal UA, lactic acid normal at 1.2, CT head with no acute findings, urine drug screen is positive for THC, amphetamine, cocaine, negative for opiates, patient had no response to 2 mg of Narcan given in ED, [facility] consulted to admit.   CURRENT Assessment and Plan:  Hypothermia - Treated and Resolved  -Secondary to exposure to the elements. Found outside unprotected for unknown amount of time.   -He was treated successfully in ICU with Bair Hugger device and his temperature has rebounded to normal.  -His sepsis work up has been unrevealing.  DC IV antibiotics.  -He is stable to discharge hospital from medical perspective.    Polysubstance abuse Recreational Drug Intoxication  -He tested positive for THC, cocaine and amphetamines on  02/22/22 -Opiates tested negative and he had NO response to naloxone -He is medically stable to return to drug rehabilitation as soon as possible.    Acute toxic encephalopathy - Resolved  -secondary to recreational drug use and severe hypothermia -this has RESOLVED and he is now at his baseline per family   Acute Urinary Retention - secondary to critical illness on presentation - DC Foley catheter today and monitor urine output   Leukocytosis -suspect stress reaction, his WBC has markedly improved from 27.6 down to 12.6 which is reassuring.    - no cause of infection has been found - follow up blood cultures: no growth to date  - de-escalating antibiotics    Major Depressive Disorder, recurrent, severe with psychosis Drug-induced psychosis -he was just discharged from a behavioral health hospitalization on 02/04/22 and he was discharged on risperdal which we have resumed    Tobacco User -nicotine patch 21 mg daily ordered    DVT Prophylaxis Lovenox   AM Labs Ordered, also please review Full Orders  DVT prophylaxis: enoxaparin  Code Status: Full  Family Communication: aunt, grandmother updated bedside 1/21 Disposition: Pt is medically cleared to discharge from hospital now.  Plan is for him to return to drug rehabilitation as soon as possible   Consultants:  N/a  Procedures:  N/a  Antimicrobials:  Cefepime/vanc 1/20-1/21   Subjective: Pt is awake, alert, cooperative, no specific complaints, he is agreeable to return to rehabilitation.    Objective: Vitals:   02/23/22 0300 02/23/22 0400 02/23/22 0500 02/23/22 0600  BP: 129/69 120/62 121/60 (!) 143/62  Pulse: 77 66  65 95  Resp: 10 10 12 14   Temp: 97.7 F (36.5 C) 97.7 F (36.5 C) (!) 97.5 F (36.4 C) (!) 97.5 F (36.4 C)  TempSrc:      SpO2: 99% 94% 98% 96%  Weight:      Height:        Intake/Output Summary (Last 24 hours) at 02/23/2022 1209 Last data filed at 02/23/2022 0214 Gross per 24 hour  Intake  3044.12 ml  Output 1200 ml  Net 1844.12 ml   Filed Weights   02/22/22 1310 02/22/22 1631  Weight: 83.4 kg 82.2 kg   Examination:  General exam: Appears calm and comfortable. No distress.    Respiratory system: BBS no increased work of breathing.  Respiratory effort normal. Cardiovascular system: normal S1 & S2 heard. No JVD, murmurs, rubs, gallops or clicks. No pedal edema. Gastrointestinal system: Abdomen is nondistended, soft and nontender. No organomegaly or masses felt. Normal bowel sounds heard. GU: foley cath in place.  Central nervous system: Alert and oriented. No focal neurological deficits. Extremities: Symmetric 5 x 5 power. Skin: No rashes, lesions or ulcers. Psychiatry: Judgement and insight appear poor. Mood & affect labile.   Data Reviewed: I have personally reviewed following labs and imaging studies  CBC: Recent Labs  Lab 02/22/22 1330 02/23/22 0449  WBC 27.6* 12.6*  NEUTROABS 25.1*  --   HGB 14.2 12.6*  HCT 42.6 37.9*  MCV 87.1 87.1  PLT 334 288    Basic Metabolic Panel: Recent Labs  Lab 02/22/22 1330 02/23/22 0449  NA 136 136  K 4.3 3.7  CL 98 106  CO2 24 21*  GLUCOSE 139* 72  BUN 19 14  CREATININE 0.89 0.82  CALCIUM 8.7* 7.9*  MG 2.2  --     CBG: Recent Labs  Lab 02/22/22 1253  GLUCAP 123*    Recent Results (from the past 240 hour(s))  Resp panel by RT-PCR (RSV, Flu A&B, Covid) Anterior Nasal Swab     Status: None   Collection Time: 02/22/22  1:21 PM   Specimen: Anterior Nasal Swab  Result Value Ref Range Status   SARS Coronavirus 2 by RT PCR NEGATIVE NEGATIVE Final    Comment: (NOTE) SARS-CoV-2 target nucleic acids are NOT DETECTED.  The SARS-CoV-2 RNA is generally detectable in upper respiratory specimens during the acute phase of infection. The lowest concentration of SARS-CoV-2 viral copies this assay can detect is 138 copies/mL. A negative result does not preclude SARS-Cov-2 infection and should not be used as the sole  basis for treatment or other patient management decisions. A negative result may occur with  improper specimen collection/handling, submission of specimen other than nasopharyngeal swab, presence of viral mutation(s) within the areas targeted by this assay, and inadequate number of viral copies(<138 copies/mL). A negative result must be combined with clinical observations, patient history, and epidemiological information. The expected result is Negative.  Fact Sheet for Patients:  02/24/22  Fact Sheet for Healthcare Providers:  BloggerCourse.com  This test is no t yet approved or cleared by the [employer] FDA and  has been authorized for detection and/or diagnosis of SARS-CoV-2 by FDA under an Emergency Use Authorization (EUA). This EUA will remain  in effect (meaning this test can be used) for the duration of the COVID-19 declaration under Section 564(b)(1) of the Act, 21 U.S.C.section 360bbb-3(b)(1), unless the authorization is terminated  or revoked sooner.       Influenza A by PCR NEGATIVE NEGATIVE Final   Influenza B  by PCR NEGATIVE NEGATIVE Final    Comment: (NOTE) The Xpert Xpress SARS-CoV-2/FLU/RSV plus assay is intended as an aid in the diagnosis of influenza from Nasopharyngeal swab specimens and should not be used as a sole basis for treatment. Nasal washings and aspirates are unacceptable for Xpert Xpress SARS-CoV-2/FLU/RSV testing.  Fact Sheet for Patients: EntrepreneurPulse.com.au  Fact Sheet for Healthcare Providers: IncredibleEmployment.be  This test is not yet approved or cleared by the [place] FDA and has been authorized for detection and/or diagnosis of SARS-CoV-2 by FDA under an Emergency Use Authorization (EUA). This EUA will remain in effect (meaning this test can be used) for the duration of the COVID-19 declaration under Section 564(b)(1) of the Act,  21 U.S.C. section 360bbb-3(b)(1), unless the authorization is terminated or revoked.     Resp Syncytial Virus by PCR NEGATIVE NEGATIVE Final    Comment: (NOTE) Fact Sheet for Patients: EntrepreneurPulse.com.au  Fact Sheet for Healthcare Providers: IncredibleEmployment.be  This test is not yet approved or cleared by the [place] FDA and has been authorized for detection and/or diagnosis of SARS-CoV-2 by FDA under an Emergency Use Authorization (EUA). This EUA will remain in effect (meaning this test can be used) for the duration of the COVID-19 declaration under Section 564(b)(1) of the Act, 21 U.S.C. section 360bbb-3(b)(1), unless the authorization is terminated or revoked.  Performed at [facility], [address] [place], [place] [address]   Blood Culture (routine x 2)     Status: None (Preliminary result)   Collection Time: 02/22/22  1:30 PM   Specimen: Left Antecubital; Blood  Result Value Ref Range Status   Specimen Description   Final    LEFT ANTECUBITAL BOTTLES DRAWN AEROBIC AND ANAEROBIC   Special Requests   Final    Blood Culture results may not be optimal due to an excessive volume of blood received in culture bottles   Culture   Final    NO GROWTH < 12 HOURS Performed at [facility], [address], [place], [place] [address]    Report Status PENDING  Incomplete  Blood Culture (routine x 2)     Status: None (Preliminary result)   Collection Time: 02/22/22  1:30 PM   Specimen: BLOOD LEFT HAND  Result Value Ref Range Status   Specimen Description   Final    BLOOD LEFT HAND BOTTLES DRAWN AEROBIC AND ANAEROBIC   Special Requests   Final    Blood Culture results may not be optimal due to an excessive volume of blood received in culture bottles   Culture   Final    NO GROWTH < 12 HOURS Performed at [facility], [address], [place], [place] [address]    Report Status PENDING  Incomplete  MRSA Next Gen by PCR, Nasal      Status: None   Collection Time: 02/22/22  5:45 PM   Specimen: Nasal Mucosa; Nasal Swab  Result Value Ref Range Status   MRSA by PCR Next Gen NOT DETECTED NOT DETECTED Final    Comment: (NOTE) The GeneXpert MRSA Assay (FDA approved for NASAL specimens only), is one component of a comprehensive MRSA colonization surveillance program. It is not intended to diagnose MRSA infection nor to guide or monitor treatment for MRSA infections. Test performance is not FDA approved in patients less than 9 years old. Performed at [facility], [address], [place], [place] [address]      Radiology Studies: [facility] Chest [facility] 1 View  Result Date: 02/22/2022 CLINICAL DATA:  Possible sepsis.  Confusion. EXAM: PORTABLE CHEST 1 VIEW COMPARISON:  10/11/2011 FINDINGS: Patient is slightly rotated to the left as artifact overlies the mid and left upper chest. Lungs are hypoinflated without focal airspace consolidation or effusion. Cardiomediastinal silhouette and remainder of the exam is unremarkable. IMPRESSION: Hypoinflation without acute cardiopulmonary disease. Electronically Signed   By: Marin Olp M.D.   On: 02/22/2022 15:12   CT Head Wo Contrast  Result Date: 02/22/2022 CLINICAL DATA:  Mental status change, unknown cause EXAM: CT HEAD WITHOUT CONTRAST TECHNIQUE: Contiguous axial images were obtained from the base of the skull through the vertex without intravenous contrast. RADIATION DOSE REDUCTION: This exam was performed according to the departmental dose-optimization program which includes automated exposure control, adjustment of the mA and/or kV according to patient size and/or use of iterative reconstruction technique. COMPARISON:  07/19/2021 FINDINGS: Brain: No evidence of acute infarction, hemorrhage, hydrocephalus, extra-axial collection or mass lesion/mass effect. Vascular: No hyperdense vessel or unexpected calcification. Skull: Normal. Negative for fracture or focal lesion. Sinuses/Orbits: No acute  finding.  Chronic ethmoid sinusitis noted. IMPRESSION: No acute intracranial process. Electronically Signed   By: Sammie Bench M.D.   On: 02/22/2022 15:02    Scheduled Meds:  Chlorhexidine Gluconate Cloth  6 each Topical Daily   enoxaparin (LOVENOX) injection  40 mg Subcutaneous Q24H   nicotine  14 mg Transdermal Daily   risperiDONE  1 mg Oral Daily   risperiDONE  3 mg Oral QHS   Continuous Infusions:  lactated ringers 100 mL/hr at 02/23/22 0214     LOS: 1 day   Time spent: 45 mins  Loc Feinstein Wynetta Emery, MD How to contact the [facility] Attending or Consulting provider [place] or covering provider during after hours [place], for this patient?  Check the care team in [facility] and look for a) attending/consulting [facility] provider listed and b) the [facility] team listed Log into [email or web address] and use [facility] universal password to access. If you do not have the password, please contact the hospital operator. Locate the [facility] provider you are looking for under [facility] and page to a number that you can be directly reached. If you still have difficulty reaching the provider, please page the [facility] (Director on Call) for the Hospitalists listed on amion for assistance.  02/23/2022, 12:09 PM

## 2022-02-24 DIAGNOSIS — F1999 Other psychoactive substance use, unspecified with unspecified psychoactive substance-induced disorder: Secondary | ICD-10-CM

## 2022-02-24 DIAGNOSIS — F1520 Other stimulant dependence, uncomplicated: Secondary | ICD-10-CM | POA: Diagnosis not present

## 2022-02-24 DIAGNOSIS — F17200 Nicotine dependence, unspecified, uncomplicated: Secondary | ICD-10-CM | POA: Diagnosis not present

## 2022-02-24 DIAGNOSIS — R69 Illness, unspecified: Secondary | ICD-10-CM | POA: Diagnosis not present

## 2022-02-24 DIAGNOSIS — F4310 Post-traumatic stress disorder, unspecified: Secondary | ICD-10-CM | POA: Diagnosis not present

## 2022-02-24 DIAGNOSIS — F419 Anxiety disorder, unspecified: Secondary | ICD-10-CM | POA: Diagnosis not present

## 2022-02-24 DIAGNOSIS — F14280 Cocaine dependence with cocaine-induced anxiety disorder: Secondary | ICD-10-CM | POA: Diagnosis not present

## 2022-02-24 LAB — URINE CULTURE: Culture: NO GROWTH

## 2022-02-24 NOTE — Discharge Summary (Signed)
Physician Discharge Summary  Roger [name] [id number] DOB: 04-12-1990 DOA: 02/22/2022   Admit date: 02/22/2022 Discharge date: 02/24/2022  Disposition: direct transport to rehab [facility]  Recommendations for Outpatient Follow-up:  Follow up with psychiatrist as scheduled 4-6 weeks  Discharge Condition: Medically Stable   CODE STATUS: FULL DIET: regular    Brief Hospitalization Summary: Please see all hospital notes, images, labs for full details of the hospitalization. ADMISSION HPI:  32 y.o. male with past medical history of polysubstance abuse including IV drug abuse, substance-induced psychotic disorder, major depression, severe, with psychosis, status post recent hospitalization at [facility] hospital where he was discharged 02/04/22. He subsequently enrolled in a residential substance abuse program in [place], [place].  On presentation,  patient is altered cannot provide any history.  This history was obtained with aunt by phone.  Reportedly patient was discharged from [facility] facility a couple days ago given due to fever and unknown other reasons.   Patient returned to [place], [place] but apparently he went missing secondary due to drug abuse.  He was found in someone's yard obtunded where EMS were called.  His temperature on the scene was 81 degrees Fahrenheit.   -In ED patient was hypothermic at 85 degrees, up from 81 degrees at the scene.  Medical workup was significant for leukocytosis at 27 K and normal UA, lactic acid normal at 1.2, CT head with no acute findings, urine drug screen is positive for THC, amphetamine, cocaine, negative for opiates, patient had no response to 2 mg of Narcan given in ED, [facility] consulted to admit.  HOSPITAL COURSE BY PROBLEM  Hypothermia - Treated and Resolved  -Secondary to exposure to the elements. Found outside unprotected for unknown amount of time.   -He was treated successfully in ICU with Bair Hugger device and  his temperature has rebounded to normal.  -His sepsis work up has been unrevealing.  DC IV antibiotics.  -He is stable to discharge hospital from medical perspective.    Polysubstance abuse Recreational Drug Intoxication  -He tested positive for THC, cocaine and amphetamines on 02/22/22 -Opiates tested negative and he had NO response to naloxone -He is medically stable to return to drug rehabilitation as soon as possible.    Acute toxic encephalopathy - Resolved  -secondary to recreational drug use and severe hypothermia -this has RESOLVED and he is now at his baseline per family    Acute Urinary Retention - secondary to critical illness on presentation - DC Foley catheter and he has been voiding without difficulty   Leukocytosis -suspect stress reaction, his WBC has markedly improved from 27.6 down to 12.6 which is reassuring.    - no cause of infection has been found - follow up blood cultures: no growth to date  - completed antibiotics    Major Depressive Disorder, recurrent, severe with psychosis Drug-induced psychosis -he was just discharged from a behavioral health hospitalization on 02/04/22 and he was discharged on risperdal which we have resumed -recommend he follow up with psychiatry as arranged in 4-6 weeks    Tobacco User -nicotine patch 21 mg daily ordered   Discharge Diagnoses:  Principal Problem:   Toxic encephalopathy Active Problems:   Substance-induced disorder ([place])   MDD (major depressive disorder), recurrent, severe, with psychosis ([place])   Drug abuse ([place])   Discharge Instructions:  Allergies as of 02/24/2022   No Known Allergies      Medication List     TAKE these medications  nicotine 14 mg/24hr patch Commonly known as: NICODERM CQ - dosed in mg/24 hours Place 1 patch (14 mg total) onto the skin daily.   risperiDONE 3 MG tablet Commonly known as: RISPERDAL Take 1 tablet (3 mg total) by mouth at bedtime.   risperiDONE 1 MG  tablet Commonly known as: RISPERDAL Take 1 tablet (1 mg total) by mouth daily.        Follow-up Information     Sarita Bottom, MD. Schedule an appointment as soon as possible for a visit in 1 month(s).   Specialty: Psychiatry Why: Hospital Follow Up Contact information: [address] [place] [address] [phone]                No Known Allergies Allergies as of 02/24/2022   No Known Allergies      Medication List     TAKE these medications    nicotine 14 mg/24hr patch Commonly known as: NICODERM CQ - dosed in mg/24 hours Place 1 patch (14 mg total) onto the skin daily.   risperiDONE 3 MG tablet Commonly known as: RISPERDAL Take 1 tablet (3 mg total) by mouth at bedtime.   risperiDONE 1 MG tablet Commonly known as: RISPERDAL Take 1 tablet (1 mg total) by mouth daily.        Procedures/Studies: DG Chest Port 1 View  Result Date: 02/22/2022 CLINICAL DATA:  Possible sepsis.  Confusion. EXAM: PORTABLE CHEST 1 VIEW COMPARISON:  10/11/2011 FINDINGS: Patient is slightly rotated to the left as artifact overlies the mid and left upper chest. Lungs are hypoinflated without focal airspace consolidation or effusion. Cardiomediastinal silhouette and remainder of the exam is unremarkable. IMPRESSION: Hypoinflation without acute cardiopulmonary disease. Electronically Signed   By: Elberta Fortis M.D.   On: 02/22/2022 15:12   CT Head Wo Contrast  Result Date: 02/22/2022 CLINICAL DATA:  Mental status change, unknown cause EXAM: CT HEAD WITHOUT CONTRAST TECHNIQUE: Contiguous axial images were obtained from the base of the skull through the vertex without intravenous contrast. RADIATION DOSE REDUCTION: This exam was performed according to the departmental dose-optimization program which includes automated exposure control, adjustment of the mA and/or kV according to patient size and/or use of iterative reconstruction technique. COMPARISON:  07/19/2021 FINDINGS: Brain:  No evidence of acute infarction, hemorrhage, hydrocephalus, extra-axial collection or mass lesion/mass effect. Vascular: No hyperdense vessel or unexpected calcification. Skull: Normal. Negative for fracture or focal lesion. Sinuses/Orbits: No acute finding.  Chronic ethmoid sinusitis noted. IMPRESSION: No acute intracranial process. Electronically Signed   By: Layla Maw M.D.   On: 02/22/2022 15:02     Subjective: Pt agreeable to rehab, no complaints.    Discharge Exam: Vitals:   02/23/22 2044 02/24/22 0525  BP: 122/77 113/72  Pulse: 76 (!) 55  Resp: 20 17  Temp: 98 F (36.7 C) 98.3 F (36.8 C)  SpO2: 99% 99%   Vitals:   02/23/22 1400 02/23/22 1530 02/23/22 2044 02/24/22 0525  BP: 120/64 118/87 122/77 113/72  Pulse: 76 84 76 (!) 55  Resp:   20 17  Temp:  98.4 F (36.9 C) 98 F (36.7 C) 98.3 F (36.8 C)  TempSrc:  Oral Oral Oral  SpO2: 94% 100% 99% 99%  Weight:      Height:       General: Pt is alert, awake, not in acute distress Cardiovascular: normal S1/S2 +, no rubs, no gallops Respiratory: CTA bilaterally, no wheezing, no rhonchi Abdominal: Soft, NT, ND, bowel sounds + Extremities: no edema, no  cyanosis   The results of significant diagnostics from this hospitalization (including imaging, microbiology, ancillary and laboratory) are listed below for reference.     Microbiology: Recent Results (from the past 240 hour(s))  Resp panel by RT-PCR (RSV, Flu A&B, Covid) Anterior Nasal Swab     Status: None   Collection Time: 02/22/22  1:21 PM   Specimen: Anterior Nasal Swab  Result Value Ref Range Status   SARS Coronavirus 2 by RT PCR NEGATIVE NEGATIVE Final    Comment: (NOTE) SARS-CoV-2 target nucleic acids are NOT DETECTED.  The SARS-CoV-2 RNA is generally detectable in upper respiratory specimens during the acute phase of infection. The lowest concentration of SARS-CoV-2 viral copies this assay can detect is 138 copies/mL. A negative result does not preclude  SARS-Cov-2 infection and should not be used as the sole basis for treatment or other patient management decisions. A negative result may occur with  improper specimen collection/handling, submission of specimen other than nasopharyngeal swab, presence of viral mutation(s) within the areas targeted by this assay, and inadequate number of viral copies(<138 copies/mL). A negative result must be combined with clinical observations, patient history, and epidemiological information. The expected result is Negative.  Fact Sheet for Patients:  EntrepreneurPulse.com.au  Fact Sheet for Healthcare Providers:  IncredibleEmployment.be  This test is no t yet approved or cleared by the [place] FDA and  has been authorized for detection and/or diagnosis of SARS-CoV-2 by FDA under an Emergency Use Authorization (EUA). This EUA will remain  in effect (meaning this test can be used) for the duration of the COVID-19 declaration under Section 564(b)(1) of the Act, 21 U.S.C.section 360bbb-3(b)(1), unless the authorization is terminated  or revoked sooner.       Influenza A by PCR NEGATIVE NEGATIVE Final   Influenza B by PCR NEGATIVE NEGATIVE Final    Comment: (NOTE) The Xpert Xpress SARS-CoV-2/FLU/RSV plus assay is intended as an aid in the diagnosis of influenza from Nasopharyngeal swab specimens and should not be used as a sole basis for treatment. Nasal washings and aspirates are unacceptable for Xpert Xpress SARS-CoV-2/FLU/RSV testing.  Fact Sheet for Patients: EntrepreneurPulse.com.au  Fact Sheet for Healthcare Providers: IncredibleEmployment.be  This test is not yet approved or cleared by the [place] FDA and has been authorized for detection and/or diagnosis of SARS-CoV-2 by FDA under an Emergency Use Authorization (EUA). This EUA will remain in effect (meaning this test can be used) for the duration of  the COVID-19 declaration under Section 564(b)(1) of the Act, 21 U.S.C. section 360bbb-3(b)(1), unless the authorization is terminated or revoked.     Resp Syncytial Virus by PCR NEGATIVE NEGATIVE Final    Comment: (NOTE) Fact Sheet for Patients: EntrepreneurPulse.com.au  Fact Sheet for Healthcare Providers: IncredibleEmployment.be  This test is not yet approved or cleared by the [place] FDA and has been authorized for detection and/or diagnosis of SARS-CoV-2 by FDA under an Emergency Use Authorization (EUA). This EUA will remain in effect (meaning this test can be used) for the duration of the COVID-19 declaration under Section 564(b)(1) of the Act, 21 U.S.C. section 360bbb-3(b)(1), unless the authorization is terminated or revoked.  Performed at [facility], [address] [place], [place] [address]   Blood Culture (routine x 2)     Status: None (Preliminary result)   Collection Time: 02/22/22  1:30 PM   Specimen: Left Antecubital; Blood  Result Value Ref Range Status   Specimen Description   Final    LEFT ANTECUBITAL BOTTLES  DRAWN AEROBIC AND ANAEROBIC   Special Requests   Final    Blood Culture results may not be optimal due to an excessive volume of blood received in culture bottles   Culture   Final    NO GROWTH 2 DAYS Performed at [facility], [address], [place], [place] [address]    Report Status PENDING  Incomplete  Blood Culture (routine x 2)     Status: None (Preliminary result)   Collection Time: 02/22/22  1:30 PM   Specimen: BLOOD LEFT HAND  Result Value Ref Range Status   Specimen Description   Final    BLOOD LEFT HAND BOTTLES DRAWN AEROBIC AND ANAEROBIC   Special Requests   Final    Blood Culture results may not be optimal due to an excessive volume of blood received in culture bottles   Culture   Final    NO GROWTH 2 DAYS Performed at [facility], [address] [place], [place] [address]    Report Status  PENDING  Incomplete  Urine Culture     Status: None   Collection Time: 02/22/22  2:00 PM   Specimen: Urine, Catheterized  Result Value Ref Range Status   Specimen Description   Final    URINE, CATHETERIZED Performed at [facility], [address], [place], [place] [address]    Special Requests   Final    NONE Performed at [facility], [address] [place], [place] [address]    Culture   Final    NO GROWTH Performed at [facility], [place] [address] [place], [place] [address]    Report Status 02/24/2022 FINAL  Final  MRSA Next Gen by PCR, Nasal     Status: None   Collection Time: 02/22/22  5:45 PM   Specimen: Nasal Mucosa; Nasal Swab  Result Value Ref Range Status   MRSA by PCR Next Gen NOT DETECTED NOT DETECTED Final    Comment: (NOTE) The GeneXpert MRSA Assay (FDA approved for NASAL specimens only), is one component of a comprehensive MRSA colonization surveillance program. It is not intended to diagnose MRSA infection nor to guide or monitor treatment for MRSA infections. Test performance is not FDA approved in patients less than 52 years old. Performed at [facility], [address] [place], [place] [address]      Labs: BNP (last 3 results) No results for input(s): "BNP" in the last 8760 hours. Basic Metabolic Panel: Recent Labs  Lab 02/22/22 1330 02/23/22 0449  NA 136 136  K 4.3 3.7  CL 98 106  CO2 24 21*  GLUCOSE 139* 72  BUN 19 14  CREATININE 0.89 0.82  CALCIUM 8.7* 7.9*  MG 2.2  --    Liver Function Tests: Recent Labs  Lab 02/22/22 1330  AST 55*  ALT 38  ALKPHOS 96  BILITOT 0.9  PROT 8.6*  ALBUMIN 4.9   No results for input(s): "LIPASE", "AMYLASE" in the last 168 hours. No results for input(s): "AMMONIA" in the last 168 hours. CBC: Recent Labs  Lab 02/22/22 1330 02/23/22 0449  WBC 27.6* 12.6*  NEUTROABS 25.1*  --   HGB 14.2 12.6*  HCT 42.6 37.9*  MCV 87.1 87.1  PLT 334 288   Cardiac Enzymes: No results for input(s):  "CKTOTAL", "CKMB", "CKMBINDEX", "TROPONINI" in the last 168 hours. BNP: Invalid input(s): "POCBNP" CBG: Recent Labs  Lab 02/22/22 1253  GLUCAP 123*   D-Dimer No results for input(s): "DDIMER" in the last 72 hours. Hgb A1c  No results for input(s): "HGBA1C" in the last 72 hours. Lipid Profile No results for input(s): "CHOL", "HDL", "LDLCALC", "TRIG", "CHOLHDL", "LDLDIRECT" in the last 72 hours. Thyroid function studies No results for input(s): "TSH", "T4TOTAL", "T3FREE", "THYROIDAB" in the last 72 hours.  Invalid input(s): "FREET3" Anemia work up No results for input(s): "VITAMINB12", "FOLATE", "FERRITIN", "TIBC", "IRON", "RETICCTPCT" in the last 72 hours. Urinalysis    Component Value Date/Time   COLORURINE YELLOW 02/22/2022 1400   APPEARANCEUR CLEAR 02/22/2022 1400   LABSPEC 1.023 02/22/2022 1400   PHURINE 5.0 02/22/2022 1400   GLUCOSEU 50 (A) 02/22/2022 1400   HGBUR NEGATIVE 02/22/2022 1400   BILIRUBINUR NEGATIVE 02/22/2022 1400   KETONESUR 80 (A) 02/22/2022 1400   PROTEINUR 30 (A) 02/22/2022 1400   UROBILINOGEN 0.2 10/11/2011 2111   NITRITE NEGATIVE 02/22/2022 1400   LEUKOCYTESUR NEGATIVE 02/22/2022 1400   Sepsis Labs Recent Labs  Lab 02/22/22 1330 02/23/22 0449  WBC 27.6* 12.6*   Microbiology Recent Results (from the past 240 hour(s))  Resp panel by RT-PCR (RSV, Flu A&B, Covid) Anterior Nasal Swab     Status: None   Collection Time: 02/22/22  1:21 PM   Specimen: Anterior Nasal Swab  Result Value Ref Range Status   SARS Coronavirus 2 by RT PCR NEGATIVE NEGATIVE Final    Comment: (NOTE) SARS-CoV-2 target nucleic acids are NOT DETECTED.  The SARS-CoV-2 RNA is generally detectable in upper respiratory specimens during the acute phase of infection. The lowest concentration of SARS-CoV-2 viral copies this assay can detect is 138 copies/mL. A negative result does not preclude SARS-Cov-2 infection and should not be used as the sole basis for treatment or other  patient management decisions. A negative result may occur with  improper specimen collection/handling, submission of specimen other than nasopharyngeal swab, presence of viral mutation(s) within the areas targeted by this assay, and inadequate number of viral copies(<138 copies/mL). A negative result must be combined with clinical observations, patient history, and epidemiological information. The expected result is Negative.  Fact Sheet for Patients:  EntrepreneurPulse.com.au  Fact Sheet for Healthcare Providers:  IncredibleEmployment.be  This test is no t yet approved or cleared by the [place] FDA and  has been authorized for detection and/or diagnosis of SARS-CoV-2 by FDA under an Emergency Use Authorization (EUA). This EUA will remain  in effect (meaning this test can be used) for the duration of the COVID-19 declaration under Section 564(b)(1) of the Act, 21 U.S.C.section 360bbb-3(b)(1), unless the authorization is terminated  or revoked sooner.       Influenza A by PCR NEGATIVE NEGATIVE Final   Influenza B by PCR NEGATIVE NEGATIVE Final    Comment: (NOTE) The Xpert Xpress SARS-CoV-2/FLU/RSV plus assay is intended as an aid in the diagnosis of influenza from Nasopharyngeal swab specimens and should not be used as a sole basis for treatment. Nasal washings and aspirates are unacceptable for Xpert Xpress SARS-CoV-2/FLU/RSV testing.  Fact Sheet for Patients: EntrepreneurPulse.com.au  Fact Sheet for Healthcare Providers: IncredibleEmployment.be  This test is not yet approved or cleared by the [place] FDA and has been authorized for detection and/or diagnosis of SARS-CoV-2 by FDA under an Emergency Use Authorization (EUA). This EUA will remain in effect (meaning this test can be used) for the duration of the COVID-19 declaration under Section 564(b)(1) of the Act, 21 U.S.C. section  360bbb-3(b)(1), unless the authorization is terminated or revoked.     Resp Syncytial Virus by PCR NEGATIVE NEGATIVE Final    Comment: (NOTE) Fact  Sheet for Patients: EntrepreneurPulse.com.au  Fact Sheet for Healthcare Providers: IncredibleEmployment.be  This test is not yet approved or cleared by the [place] FDA and has been authorized for detection and/or diagnosis of SARS-CoV-2 by FDA under an Emergency Use Authorization (EUA). This EUA will remain in effect (meaning this test can be used) for the duration of the COVID-19 declaration under Section 564(b)(1) of the Act, 21 U.S.C. section 360bbb-3(b)(1), unless the authorization is terminated or revoked.  Performed at [facility], [address] [place], [place] [address]   Blood Culture (routine x 2)     Status: None (Preliminary result)   Collection Time: 02/22/22  1:30 PM   Specimen: Left Antecubital; Blood  Result Value Ref Range Status   Specimen Description   Final    LEFT ANTECUBITAL BOTTLES DRAWN AEROBIC AND ANAEROBIC   Special Requests   Final    Blood Culture results may not be optimal due to an excessive volume of blood received in culture bottles   Culture   Final    NO GROWTH 2 DAYS Performed at [facility], [address], [place], [place] [address]    Report Status PENDING  Incomplete  Blood Culture (routine x 2)     Status: None (Preliminary result)   Collection Time: 02/22/22  1:30 PM   Specimen: BLOOD LEFT HAND  Result Value Ref Range Status   Specimen Description   Final    BLOOD LEFT HAND BOTTLES DRAWN AEROBIC AND ANAEROBIC   Special Requests   Final    Blood Culture results may not be optimal due to an excessive volume of blood received in culture bottles   Culture   Final    NO GROWTH 2 DAYS Performed at [facility], [address] [place], [place] [address]    Report Status PENDING  Incomplete  Urine Culture     Status: None   Collection Time: 02/22/22   2:00 PM   Specimen: Urine, Catheterized  Result Value Ref Range Status   Specimen Description   Final    URINE, CATHETERIZED Performed at [facility], [address], [place], [place] [address]    Special Requests   Final    NONE Performed at [facility], [address], [place], [place] [address]    Culture   Final    NO GROWTH Performed at [facility], [place] [address], [place], [place] [address]    Report Status 02/24/2022 FINAL  Final  MRSA Next Gen by PCR, Nasal     Status: None   Collection Time: 02/22/22  5:45 PM   Specimen: Nasal Mucosa; Nasal Swab  Result Value Ref Range Status   MRSA by PCR Next Gen NOT DETECTED NOT DETECTED Final    Comment: (NOTE) The GeneXpert MRSA Assay (FDA approved for NASAL specimens only), is one component of a comprehensive MRSA colonization surveillance program. It is not intended to diagnose MRSA infection nor to guide or monitor treatment for MRSA infections. Test performance is not FDA approved in patients less than 68 years old. Performed at [facility], [address] [place], [place] [address]     Time coordinating discharge:   SIGNED:  Irwin Brakeman, MD  [facility] 02/24/2022, 10:52 AM How to contact the [facility] Attending or Consulting provider [place] or covering provider during after hours [place], for this patient?  Check the care team in [facility] and look for a) attending/consulting [facility] provider listed and b) the [facility] team listed  Log into [email or web address] and use [place]'s universal password to access. If you do not have the password, please contact the hospital operator. Locate the [facility] provider you are looking for under [facility] and page to a number that you can be directly reached. If you still have difficulty reaching the provider, please page the DOC (Director on Call) for the Hospitalists listed on amion for assistance.  

## 2022-02-24 NOTE — Discharge Instructions (Signed)

## 2022-02-24 NOTE — Progress Notes (Signed)
Patient being discharged to rehab facility in [place], IV's removed, discharge papers reviewed with patient and placed in a packet for rehab facility. Patient wheeled down to main entrance for rehab to pick patient up to transport.

## 2022-02-27 LAB — CULTURE, BLOOD (ROUTINE X 2)
Culture: NO GROWTH
Culture: NO GROWTH

## 2022-03-06 DIAGNOSIS — F17200 Nicotine dependence, unspecified, uncomplicated: Secondary | ICD-10-CM | POA: Diagnosis not present

## 2022-03-06 DIAGNOSIS — F4310 Post-traumatic stress disorder, unspecified: Secondary | ICD-10-CM | POA: Diagnosis not present

## 2022-03-06 DIAGNOSIS — F1520 Other stimulant dependence, uncomplicated: Secondary | ICD-10-CM | POA: Diagnosis not present

## 2022-03-06 DIAGNOSIS — F14280 Cocaine dependence with cocaine-induced anxiety disorder: Secondary | ICD-10-CM | POA: Diagnosis not present

## 2022-03-06 DIAGNOSIS — F419 Anxiety disorder, unspecified: Secondary | ICD-10-CM | POA: Diagnosis not present

## 2022-03-07 DIAGNOSIS — F419 Anxiety disorder, unspecified: Secondary | ICD-10-CM | POA: Diagnosis not present

## 2022-03-07 DIAGNOSIS — F1520 Other stimulant dependence, uncomplicated: Secondary | ICD-10-CM | POA: Diagnosis not present

## 2022-03-07 DIAGNOSIS — F14280 Cocaine dependence with cocaine-induced anxiety disorder: Secondary | ICD-10-CM | POA: Diagnosis not present

## 2022-03-07 DIAGNOSIS — F4310 Post-traumatic stress disorder, unspecified: Secondary | ICD-10-CM | POA: Diagnosis not present

## 2022-03-07 DIAGNOSIS — F17200 Nicotine dependence, unspecified, uncomplicated: Secondary | ICD-10-CM | POA: Diagnosis not present

## 2022-03-08 DIAGNOSIS — F1520 Other stimulant dependence, uncomplicated: Secondary | ICD-10-CM | POA: Diagnosis not present

## 2022-03-08 DIAGNOSIS — F17200 Nicotine dependence, unspecified, uncomplicated: Secondary | ICD-10-CM | POA: Diagnosis not present

## 2022-03-08 DIAGNOSIS — F4310 Post-traumatic stress disorder, unspecified: Secondary | ICD-10-CM | POA: Diagnosis not present

## 2022-03-08 DIAGNOSIS — F419 Anxiety disorder, unspecified: Secondary | ICD-10-CM | POA: Diagnosis not present

## 2022-03-08 DIAGNOSIS — F14280 Cocaine dependence with cocaine-induced anxiety disorder: Secondary | ICD-10-CM | POA: Diagnosis not present

## 2022-03-09 DIAGNOSIS — F1520 Other stimulant dependence, uncomplicated: Secondary | ICD-10-CM | POA: Diagnosis not present

## 2022-03-09 DIAGNOSIS — F4310 Post-traumatic stress disorder, unspecified: Secondary | ICD-10-CM | POA: Diagnosis not present

## 2022-03-09 DIAGNOSIS — F17200 Nicotine dependence, unspecified, uncomplicated: Secondary | ICD-10-CM | POA: Diagnosis not present

## 2022-03-09 DIAGNOSIS — F14280 Cocaine dependence with cocaine-induced anxiety disorder: Secondary | ICD-10-CM | POA: Diagnosis not present

## 2022-03-09 DIAGNOSIS — F419 Anxiety disorder, unspecified: Secondary | ICD-10-CM | POA: Diagnosis not present

## 2022-03-10 DIAGNOSIS — F4310 Post-traumatic stress disorder, unspecified: Secondary | ICD-10-CM | POA: Diagnosis not present

## 2022-03-10 DIAGNOSIS — F14280 Cocaine dependence with cocaine-induced anxiety disorder: Secondary | ICD-10-CM | POA: Diagnosis not present

## 2022-03-10 DIAGNOSIS — F17200 Nicotine dependence, unspecified, uncomplicated: Secondary | ICD-10-CM | POA: Diagnosis not present

## 2022-03-10 DIAGNOSIS — F1520 Other stimulant dependence, uncomplicated: Secondary | ICD-10-CM | POA: Diagnosis not present

## 2022-03-10 DIAGNOSIS — F419 Anxiety disorder, unspecified: Secondary | ICD-10-CM | POA: Diagnosis not present

## 2022-03-11 DIAGNOSIS — F4310 Post-traumatic stress disorder, unspecified: Secondary | ICD-10-CM | POA: Diagnosis not present

## 2022-03-11 DIAGNOSIS — F17200 Nicotine dependence, unspecified, uncomplicated: Secondary | ICD-10-CM | POA: Diagnosis not present

## 2022-03-11 DIAGNOSIS — F14280 Cocaine dependence with cocaine-induced anxiety disorder: Secondary | ICD-10-CM | POA: Diagnosis not present

## 2022-03-11 DIAGNOSIS — F1520 Other stimulant dependence, uncomplicated: Secondary | ICD-10-CM | POA: Diagnosis not present

## 2022-03-11 DIAGNOSIS — F419 Anxiety disorder, unspecified: Secondary | ICD-10-CM | POA: Diagnosis not present

## 2022-03-12 DIAGNOSIS — F4310 Post-traumatic stress disorder, unspecified: Secondary | ICD-10-CM | POA: Diagnosis not present

## 2022-03-12 DIAGNOSIS — F1520 Other stimulant dependence, uncomplicated: Secondary | ICD-10-CM | POA: Diagnosis not present

## 2022-03-12 DIAGNOSIS — F17200 Nicotine dependence, unspecified, uncomplicated: Secondary | ICD-10-CM | POA: Diagnosis not present

## 2022-03-12 DIAGNOSIS — F419 Anxiety disorder, unspecified: Secondary | ICD-10-CM | POA: Diagnosis not present

## 2022-03-12 DIAGNOSIS — F14280 Cocaine dependence with cocaine-induced anxiety disorder: Secondary | ICD-10-CM | POA: Diagnosis not present

## 2022-03-13 DIAGNOSIS — F17200 Nicotine dependence, unspecified, uncomplicated: Secondary | ICD-10-CM | POA: Diagnosis not present

## 2022-03-13 DIAGNOSIS — F419 Anxiety disorder, unspecified: Secondary | ICD-10-CM | POA: Diagnosis not present

## 2022-03-13 DIAGNOSIS — F1520 Other stimulant dependence, uncomplicated: Secondary | ICD-10-CM | POA: Diagnosis not present

## 2022-03-13 DIAGNOSIS — F4310 Post-traumatic stress disorder, unspecified: Secondary | ICD-10-CM | POA: Diagnosis not present

## 2022-03-13 DIAGNOSIS — F14280 Cocaine dependence with cocaine-induced anxiety disorder: Secondary | ICD-10-CM | POA: Diagnosis not present

## 2022-03-14 DIAGNOSIS — F14280 Cocaine dependence with cocaine-induced anxiety disorder: Secondary | ICD-10-CM | POA: Diagnosis not present

## 2022-03-14 DIAGNOSIS — F419 Anxiety disorder, unspecified: Secondary | ICD-10-CM | POA: Diagnosis not present

## 2022-03-14 DIAGNOSIS — F17200 Nicotine dependence, unspecified, uncomplicated: Secondary | ICD-10-CM | POA: Diagnosis not present

## 2022-03-14 DIAGNOSIS — F1520 Other stimulant dependence, uncomplicated: Secondary | ICD-10-CM | POA: Diagnosis not present

## 2022-03-14 DIAGNOSIS — F4310 Post-traumatic stress disorder, unspecified: Secondary | ICD-10-CM | POA: Diagnosis not present

## 2022-03-15 DIAGNOSIS — F419 Anxiety disorder, unspecified: Secondary | ICD-10-CM | POA: Diagnosis not present

## 2022-03-15 DIAGNOSIS — F17200 Nicotine dependence, unspecified, uncomplicated: Secondary | ICD-10-CM | POA: Diagnosis not present

## 2022-03-15 DIAGNOSIS — F4310 Post-traumatic stress disorder, unspecified: Secondary | ICD-10-CM | POA: Diagnosis not present

## 2022-03-15 DIAGNOSIS — F1520 Other stimulant dependence, uncomplicated: Secondary | ICD-10-CM | POA: Diagnosis not present

## 2022-03-15 DIAGNOSIS — F14280 Cocaine dependence with cocaine-induced anxiety disorder: Secondary | ICD-10-CM | POA: Diagnosis not present

## 2022-03-16 DIAGNOSIS — F14280 Cocaine dependence with cocaine-induced anxiety disorder: Secondary | ICD-10-CM | POA: Diagnosis not present

## 2022-03-16 DIAGNOSIS — F4310 Post-traumatic stress disorder, unspecified: Secondary | ICD-10-CM | POA: Diagnosis not present

## 2022-03-16 DIAGNOSIS — F419 Anxiety disorder, unspecified: Secondary | ICD-10-CM | POA: Diagnosis not present

## 2022-03-16 DIAGNOSIS — F17200 Nicotine dependence, unspecified, uncomplicated: Secondary | ICD-10-CM | POA: Diagnosis not present

## 2022-03-16 DIAGNOSIS — F1520 Other stimulant dependence, uncomplicated: Secondary | ICD-10-CM | POA: Diagnosis not present

## 2022-03-17 DIAGNOSIS — F419 Anxiety disorder, unspecified: Secondary | ICD-10-CM | POA: Diagnosis not present

## 2022-03-17 DIAGNOSIS — F4310 Post-traumatic stress disorder, unspecified: Secondary | ICD-10-CM | POA: Diagnosis not present

## 2022-03-17 DIAGNOSIS — F14280 Cocaine dependence with cocaine-induced anxiety disorder: Secondary | ICD-10-CM | POA: Diagnosis not present

## 2022-03-17 DIAGNOSIS — F17200 Nicotine dependence, unspecified, uncomplicated: Secondary | ICD-10-CM | POA: Diagnosis not present

## 2022-03-17 DIAGNOSIS — F1520 Other stimulant dependence, uncomplicated: Secondary | ICD-10-CM | POA: Diagnosis not present

## 2022-03-18 DIAGNOSIS — F419 Anxiety disorder, unspecified: Secondary | ICD-10-CM | POA: Diagnosis not present

## 2022-03-18 DIAGNOSIS — F1520 Other stimulant dependence, uncomplicated: Secondary | ICD-10-CM | POA: Diagnosis not present

## 2022-03-18 DIAGNOSIS — F4310 Post-traumatic stress disorder, unspecified: Secondary | ICD-10-CM | POA: Diagnosis not present

## 2022-03-18 DIAGNOSIS — F17200 Nicotine dependence, unspecified, uncomplicated: Secondary | ICD-10-CM | POA: Diagnosis not present

## 2022-03-18 DIAGNOSIS — F14280 Cocaine dependence with cocaine-induced anxiety disorder: Secondary | ICD-10-CM | POA: Diagnosis not present

## 2022-03-19 DIAGNOSIS — F419 Anxiety disorder, unspecified: Secondary | ICD-10-CM | POA: Diagnosis not present

## 2022-03-19 DIAGNOSIS — F1520 Other stimulant dependence, uncomplicated: Secondary | ICD-10-CM | POA: Diagnosis not present

## 2022-03-19 DIAGNOSIS — F4310 Post-traumatic stress disorder, unspecified: Secondary | ICD-10-CM | POA: Diagnosis not present

## 2022-03-19 DIAGNOSIS — F17200 Nicotine dependence, unspecified, uncomplicated: Secondary | ICD-10-CM | POA: Diagnosis not present

## 2022-03-19 DIAGNOSIS — F14280 Cocaine dependence with cocaine-induced anxiety disorder: Secondary | ICD-10-CM | POA: Diagnosis not present

## 2022-03-20 ENCOUNTER — Telehealth: Payer: Self-pay

## 2022-03-20 DIAGNOSIS — F4310 Post-traumatic stress disorder, unspecified: Secondary | ICD-10-CM | POA: Diagnosis not present

## 2022-03-20 DIAGNOSIS — F419 Anxiety disorder, unspecified: Secondary | ICD-10-CM | POA: Diagnosis not present

## 2022-03-20 DIAGNOSIS — F1520 Other stimulant dependence, uncomplicated: Secondary | ICD-10-CM | POA: Diagnosis not present

## 2022-03-20 DIAGNOSIS — F17200 Nicotine dependence, unspecified, uncomplicated: Secondary | ICD-10-CM | POA: Diagnosis not present

## 2022-03-20 DIAGNOSIS — F14280 Cocaine dependence with cocaine-induced anxiety disorder: Secondary | ICD-10-CM | POA: Diagnosis not present

## 2022-03-20 NOTE — Telephone Encounter (Signed)
  It was attempted to follow up with the patient by phone call on 1.4.24  however pt was unavailable to answer at this time.  Based on review of notes seems to be already connected with with a healthcare provider to address his current needs as it relates to behavioral health  A voicemail and text message was left at the time of calls and asked for pt to return call

## 2022-03-21 DIAGNOSIS — F4310 Post-traumatic stress disorder, unspecified: Secondary | ICD-10-CM | POA: Diagnosis not present

## 2022-03-21 DIAGNOSIS — F17200 Nicotine dependence, unspecified, uncomplicated: Secondary | ICD-10-CM | POA: Diagnosis not present

## 2022-03-21 DIAGNOSIS — F419 Anxiety disorder, unspecified: Secondary | ICD-10-CM | POA: Diagnosis not present

## 2022-03-21 DIAGNOSIS — F14280 Cocaine dependence with cocaine-induced anxiety disorder: Secondary | ICD-10-CM | POA: Diagnosis not present

## 2022-03-21 DIAGNOSIS — F1520 Other stimulant dependence, uncomplicated: Secondary | ICD-10-CM | POA: Diagnosis not present

## 2022-03-22 DIAGNOSIS — F14280 Cocaine dependence with cocaine-induced anxiety disorder: Secondary | ICD-10-CM | POA: Diagnosis not present

## 2022-03-22 DIAGNOSIS — F17200 Nicotine dependence, unspecified, uncomplicated: Secondary | ICD-10-CM | POA: Diagnosis not present

## 2022-03-22 DIAGNOSIS — F1520 Other stimulant dependence, uncomplicated: Secondary | ICD-10-CM | POA: Diagnosis not present

## 2022-03-22 DIAGNOSIS — F4310 Post-traumatic stress disorder, unspecified: Secondary | ICD-10-CM | POA: Diagnosis not present

## 2022-03-22 DIAGNOSIS — F419 Anxiety disorder, unspecified: Secondary | ICD-10-CM | POA: Diagnosis not present

## 2022-03-23 DIAGNOSIS — F14280 Cocaine dependence with cocaine-induced anxiety disorder: Secondary | ICD-10-CM | POA: Diagnosis not present

## 2022-03-23 DIAGNOSIS — F1520 Other stimulant dependence, uncomplicated: Secondary | ICD-10-CM | POA: Diagnosis not present

## 2022-03-23 DIAGNOSIS — F419 Anxiety disorder, unspecified: Secondary | ICD-10-CM | POA: Diagnosis not present

## 2022-03-23 DIAGNOSIS — F17200 Nicotine dependence, unspecified, uncomplicated: Secondary | ICD-10-CM | POA: Diagnosis not present

## 2022-03-23 DIAGNOSIS — F4310 Post-traumatic stress disorder, unspecified: Secondary | ICD-10-CM | POA: Diagnosis not present

## 2022-03-24 DIAGNOSIS — F4310 Post-traumatic stress disorder, unspecified: Secondary | ICD-10-CM | POA: Diagnosis not present

## 2022-03-24 DIAGNOSIS — F17200 Nicotine dependence, unspecified, uncomplicated: Secondary | ICD-10-CM | POA: Diagnosis not present

## 2022-03-24 DIAGNOSIS — F14280 Cocaine dependence with cocaine-induced anxiety disorder: Secondary | ICD-10-CM | POA: Diagnosis not present

## 2022-03-24 DIAGNOSIS — F1520 Other stimulant dependence, uncomplicated: Secondary | ICD-10-CM | POA: Diagnosis not present

## 2022-03-24 DIAGNOSIS — F419 Anxiety disorder, unspecified: Secondary | ICD-10-CM | POA: Diagnosis not present

## 2022-03-25 DIAGNOSIS — F17200 Nicotine dependence, unspecified, uncomplicated: Secondary | ICD-10-CM | POA: Diagnosis not present

## 2022-03-25 DIAGNOSIS — F4310 Post-traumatic stress disorder, unspecified: Secondary | ICD-10-CM | POA: Diagnosis not present

## 2022-03-25 DIAGNOSIS — F1520 Other stimulant dependence, uncomplicated: Secondary | ICD-10-CM | POA: Diagnosis not present

## 2022-03-25 DIAGNOSIS — F419 Anxiety disorder, unspecified: Secondary | ICD-10-CM | POA: Diagnosis not present

## 2022-03-25 DIAGNOSIS — F14280 Cocaine dependence with cocaine-induced anxiety disorder: Secondary | ICD-10-CM | POA: Diagnosis not present

## 2022-03-26 DIAGNOSIS — F4310 Post-traumatic stress disorder, unspecified: Secondary | ICD-10-CM | POA: Diagnosis not present

## 2022-03-26 DIAGNOSIS — F419 Anxiety disorder, unspecified: Secondary | ICD-10-CM | POA: Diagnosis not present

## 2022-03-26 DIAGNOSIS — F1520 Other stimulant dependence, uncomplicated: Secondary | ICD-10-CM | POA: Diagnosis not present

## 2022-03-26 DIAGNOSIS — F17200 Nicotine dependence, unspecified, uncomplicated: Secondary | ICD-10-CM | POA: Diagnosis not present

## 2022-03-26 DIAGNOSIS — F14280 Cocaine dependence with cocaine-induced anxiety disorder: Secondary | ICD-10-CM | POA: Diagnosis not present

## 2022-03-27 DIAGNOSIS — F419 Anxiety disorder, unspecified: Secondary | ICD-10-CM | POA: Diagnosis not present

## 2022-03-27 DIAGNOSIS — F14280 Cocaine dependence with cocaine-induced anxiety disorder: Secondary | ICD-10-CM | POA: Diagnosis not present

## 2022-03-27 DIAGNOSIS — F4310 Post-traumatic stress disorder, unspecified: Secondary | ICD-10-CM | POA: Diagnosis not present

## 2022-03-27 DIAGNOSIS — F1520 Other stimulant dependence, uncomplicated: Secondary | ICD-10-CM | POA: Diagnosis not present

## 2022-03-27 DIAGNOSIS — F17200 Nicotine dependence, unspecified, uncomplicated: Secondary | ICD-10-CM | POA: Diagnosis not present

## 2022-03-28 DIAGNOSIS — F17200 Nicotine dependence, unspecified, uncomplicated: Secondary | ICD-10-CM | POA: Diagnosis not present

## 2022-03-28 DIAGNOSIS — F14280 Cocaine dependence with cocaine-induced anxiety disorder: Secondary | ICD-10-CM | POA: Diagnosis not present

## 2022-03-28 DIAGNOSIS — F419 Anxiety disorder, unspecified: Secondary | ICD-10-CM | POA: Diagnosis not present

## 2022-03-28 DIAGNOSIS — F1520 Other stimulant dependence, uncomplicated: Secondary | ICD-10-CM | POA: Diagnosis not present

## 2022-03-28 DIAGNOSIS — F4310 Post-traumatic stress disorder, unspecified: Secondary | ICD-10-CM | POA: Diagnosis not present

## 2022-03-29 DIAGNOSIS — F1520 Other stimulant dependence, uncomplicated: Secondary | ICD-10-CM | POA: Diagnosis not present

## 2022-03-29 DIAGNOSIS — F17200 Nicotine dependence, unspecified, uncomplicated: Secondary | ICD-10-CM | POA: Diagnosis not present

## 2022-03-29 DIAGNOSIS — F4310 Post-traumatic stress disorder, unspecified: Secondary | ICD-10-CM | POA: Diagnosis not present

## 2022-03-29 DIAGNOSIS — F14280 Cocaine dependence with cocaine-induced anxiety disorder: Secondary | ICD-10-CM | POA: Diagnosis not present

## 2022-03-29 DIAGNOSIS — F419 Anxiety disorder, unspecified: Secondary | ICD-10-CM | POA: Diagnosis not present

## 2022-03-30 DIAGNOSIS — F4310 Post-traumatic stress disorder, unspecified: Secondary | ICD-10-CM | POA: Diagnosis not present

## 2022-03-30 DIAGNOSIS — F1520 Other stimulant dependence, uncomplicated: Secondary | ICD-10-CM | POA: Diagnosis not present

## 2022-03-30 DIAGNOSIS — F419 Anxiety disorder, unspecified: Secondary | ICD-10-CM | POA: Diagnosis not present

## 2022-03-30 DIAGNOSIS — F14280 Cocaine dependence with cocaine-induced anxiety disorder: Secondary | ICD-10-CM | POA: Diagnosis not present

## 2022-03-30 DIAGNOSIS — F17200 Nicotine dependence, unspecified, uncomplicated: Secondary | ICD-10-CM | POA: Diagnosis not present

## 2022-03-31 DIAGNOSIS — F4310 Post-traumatic stress disorder, unspecified: Secondary | ICD-10-CM | POA: Diagnosis not present

## 2022-03-31 DIAGNOSIS — F419 Anxiety disorder, unspecified: Secondary | ICD-10-CM | POA: Diagnosis not present

## 2022-03-31 DIAGNOSIS — F17200 Nicotine dependence, unspecified, uncomplicated: Secondary | ICD-10-CM | POA: Diagnosis not present

## 2022-03-31 DIAGNOSIS — F14280 Cocaine dependence with cocaine-induced anxiety disorder: Secondary | ICD-10-CM | POA: Diagnosis not present

## 2022-03-31 DIAGNOSIS — F1520 Other stimulant dependence, uncomplicated: Secondary | ICD-10-CM | POA: Diagnosis not present

## 2022-04-01 DIAGNOSIS — F17200 Nicotine dependence, unspecified, uncomplicated: Secondary | ICD-10-CM | POA: Diagnosis not present

## 2022-04-01 DIAGNOSIS — F14280 Cocaine dependence with cocaine-induced anxiety disorder: Secondary | ICD-10-CM | POA: Diagnosis not present

## 2022-04-01 DIAGNOSIS — F1520 Other stimulant dependence, uncomplicated: Secondary | ICD-10-CM | POA: Diagnosis not present

## 2022-04-01 DIAGNOSIS — F4310 Post-traumatic stress disorder, unspecified: Secondary | ICD-10-CM | POA: Diagnosis not present

## 2022-04-01 DIAGNOSIS — F419 Anxiety disorder, unspecified: Secondary | ICD-10-CM | POA: Diagnosis not present

## 2022-04-04 DIAGNOSIS — F14280 Cocaine dependence with cocaine-induced anxiety disorder: Secondary | ICD-10-CM | POA: Diagnosis not present

## 2022-04-04 DIAGNOSIS — F4310 Post-traumatic stress disorder, unspecified: Secondary | ICD-10-CM | POA: Diagnosis not present

## 2022-04-04 DIAGNOSIS — F419 Anxiety disorder, unspecified: Secondary | ICD-10-CM | POA: Diagnosis not present

## 2022-04-04 DIAGNOSIS — F1520 Other stimulant dependence, uncomplicated: Secondary | ICD-10-CM | POA: Diagnosis not present

## 2022-04-04 DIAGNOSIS — F17200 Nicotine dependence, unspecified, uncomplicated: Secondary | ICD-10-CM | POA: Diagnosis not present

## 2022-04-05 DIAGNOSIS — F17200 Nicotine dependence, unspecified, uncomplicated: Secondary | ICD-10-CM | POA: Diagnosis not present

## 2022-04-05 DIAGNOSIS — F1520 Other stimulant dependence, uncomplicated: Secondary | ICD-10-CM | POA: Diagnosis not present

## 2022-04-05 DIAGNOSIS — F14280 Cocaine dependence with cocaine-induced anxiety disorder: Secondary | ICD-10-CM | POA: Diagnosis not present

## 2022-04-05 DIAGNOSIS — F419 Anxiety disorder, unspecified: Secondary | ICD-10-CM | POA: Diagnosis not present

## 2022-04-05 DIAGNOSIS — F4310 Post-traumatic stress disorder, unspecified: Secondary | ICD-10-CM | POA: Diagnosis not present

## 2022-04-06 DIAGNOSIS — F1520 Other stimulant dependence, uncomplicated: Secondary | ICD-10-CM | POA: Diagnosis not present

## 2022-04-06 DIAGNOSIS — F17200 Nicotine dependence, unspecified, uncomplicated: Secondary | ICD-10-CM | POA: Diagnosis not present

## 2022-04-06 DIAGNOSIS — F4310 Post-traumatic stress disorder, unspecified: Secondary | ICD-10-CM | POA: Diagnosis not present

## 2022-04-06 DIAGNOSIS — F419 Anxiety disorder, unspecified: Secondary | ICD-10-CM | POA: Diagnosis not present

## 2022-04-06 DIAGNOSIS — F14280 Cocaine dependence with cocaine-induced anxiety disorder: Secondary | ICD-10-CM | POA: Diagnosis not present

## 2022-04-07 DIAGNOSIS — F1520 Other stimulant dependence, uncomplicated: Secondary | ICD-10-CM | POA: Diagnosis not present

## 2022-04-07 DIAGNOSIS — F14280 Cocaine dependence with cocaine-induced anxiety disorder: Secondary | ICD-10-CM | POA: Diagnosis not present

## 2022-04-07 DIAGNOSIS — F419 Anxiety disorder, unspecified: Secondary | ICD-10-CM | POA: Diagnosis not present

## 2022-04-07 DIAGNOSIS — F17200 Nicotine dependence, unspecified, uncomplicated: Secondary | ICD-10-CM | POA: Diagnosis not present

## 2022-04-07 DIAGNOSIS — F4310 Post-traumatic stress disorder, unspecified: Secondary | ICD-10-CM | POA: Diagnosis not present

## 2022-04-08 DIAGNOSIS — F17200 Nicotine dependence, unspecified, uncomplicated: Secondary | ICD-10-CM | POA: Diagnosis not present

## 2022-04-08 DIAGNOSIS — F1520 Other stimulant dependence, uncomplicated: Secondary | ICD-10-CM | POA: Diagnosis not present

## 2022-04-08 DIAGNOSIS — F4310 Post-traumatic stress disorder, unspecified: Secondary | ICD-10-CM | POA: Diagnosis not present

## 2022-04-08 DIAGNOSIS — F419 Anxiety disorder, unspecified: Secondary | ICD-10-CM | POA: Diagnosis not present

## 2022-04-08 DIAGNOSIS — F14280 Cocaine dependence with cocaine-induced anxiety disorder: Secondary | ICD-10-CM | POA: Diagnosis not present

## 2022-04-09 DIAGNOSIS — F17200 Nicotine dependence, unspecified, uncomplicated: Secondary | ICD-10-CM | POA: Diagnosis not present

## 2022-04-09 DIAGNOSIS — F14280 Cocaine dependence with cocaine-induced anxiety disorder: Secondary | ICD-10-CM | POA: Diagnosis not present

## 2022-04-09 DIAGNOSIS — F419 Anxiety disorder, unspecified: Secondary | ICD-10-CM | POA: Diagnosis not present

## 2022-04-09 DIAGNOSIS — F4310 Post-traumatic stress disorder, unspecified: Secondary | ICD-10-CM | POA: Diagnosis not present

## 2022-04-09 DIAGNOSIS — F1520 Other stimulant dependence, uncomplicated: Secondary | ICD-10-CM | POA: Diagnosis not present

## 2022-04-10 DIAGNOSIS — F4310 Post-traumatic stress disorder, unspecified: Secondary | ICD-10-CM | POA: Diagnosis not present

## 2022-04-10 DIAGNOSIS — F1520 Other stimulant dependence, uncomplicated: Secondary | ICD-10-CM | POA: Diagnosis not present

## 2022-04-10 DIAGNOSIS — F17200 Nicotine dependence, unspecified, uncomplicated: Secondary | ICD-10-CM | POA: Diagnosis not present

## 2022-04-10 DIAGNOSIS — F419 Anxiety disorder, unspecified: Secondary | ICD-10-CM | POA: Diagnosis not present

## 2022-04-10 DIAGNOSIS — F14280 Cocaine dependence with cocaine-induced anxiety disorder: Secondary | ICD-10-CM | POA: Diagnosis not present

## 2022-04-11 DIAGNOSIS — F4310 Post-traumatic stress disorder, unspecified: Secondary | ICD-10-CM | POA: Diagnosis not present

## 2022-04-11 DIAGNOSIS — F1520 Other stimulant dependence, uncomplicated: Secondary | ICD-10-CM | POA: Diagnosis not present

## 2022-04-11 DIAGNOSIS — F14280 Cocaine dependence with cocaine-induced anxiety disorder: Secondary | ICD-10-CM | POA: Diagnosis not present

## 2022-04-11 DIAGNOSIS — F17200 Nicotine dependence, unspecified, uncomplicated: Secondary | ICD-10-CM | POA: Diagnosis not present

## 2022-04-11 DIAGNOSIS — F419 Anxiety disorder, unspecified: Secondary | ICD-10-CM | POA: Diagnosis not present

## 2022-04-12 DIAGNOSIS — F419 Anxiety disorder, unspecified: Secondary | ICD-10-CM | POA: Diagnosis not present

## 2022-04-12 DIAGNOSIS — F17200 Nicotine dependence, unspecified, uncomplicated: Secondary | ICD-10-CM | POA: Diagnosis not present

## 2022-04-12 DIAGNOSIS — F1520 Other stimulant dependence, uncomplicated: Secondary | ICD-10-CM | POA: Diagnosis not present

## 2022-04-12 DIAGNOSIS — F4310 Post-traumatic stress disorder, unspecified: Secondary | ICD-10-CM | POA: Diagnosis not present

## 2022-04-12 DIAGNOSIS — F14280 Cocaine dependence with cocaine-induced anxiety disorder: Secondary | ICD-10-CM | POA: Diagnosis not present

## 2022-04-13 DIAGNOSIS — F14280 Cocaine dependence with cocaine-induced anxiety disorder: Secondary | ICD-10-CM | POA: Diagnosis not present

## 2022-04-13 DIAGNOSIS — F4310 Post-traumatic stress disorder, unspecified: Secondary | ICD-10-CM | POA: Diagnosis not present

## 2022-04-13 DIAGNOSIS — F419 Anxiety disorder, unspecified: Secondary | ICD-10-CM | POA: Diagnosis not present

## 2022-04-13 DIAGNOSIS — F17200 Nicotine dependence, unspecified, uncomplicated: Secondary | ICD-10-CM | POA: Diagnosis not present

## 2022-04-13 DIAGNOSIS — F1520 Other stimulant dependence, uncomplicated: Secondary | ICD-10-CM | POA: Diagnosis not present

## 2022-04-14 DIAGNOSIS — F419 Anxiety disorder, unspecified: Secondary | ICD-10-CM | POA: Diagnosis not present

## 2022-04-14 DIAGNOSIS — F4310 Post-traumatic stress disorder, unspecified: Secondary | ICD-10-CM | POA: Diagnosis not present

## 2022-04-14 DIAGNOSIS — F17200 Nicotine dependence, unspecified, uncomplicated: Secondary | ICD-10-CM | POA: Diagnosis not present

## 2022-04-14 DIAGNOSIS — F14280 Cocaine dependence with cocaine-induced anxiety disorder: Secondary | ICD-10-CM | POA: Diagnosis not present

## 2022-04-14 DIAGNOSIS — F1520 Other stimulant dependence, uncomplicated: Secondary | ICD-10-CM | POA: Diagnosis not present

## 2022-05-13 DIAGNOSIS — Z79899 Other long term (current) drug therapy: Secondary | ICD-10-CM | POA: Diagnosis not present

## 2022-05-13 DIAGNOSIS — F1120 Opioid dependence, uncomplicated: Secondary | ICD-10-CM | POA: Diagnosis not present

## 2022-05-13 DIAGNOSIS — Z5181 Encounter for therapeutic drug level monitoring: Secondary | ICD-10-CM | POA: Diagnosis not present

## 2022-05-14 DIAGNOSIS — F1120 Opioid dependence, uncomplicated: Secondary | ICD-10-CM | POA: Diagnosis not present

## 2022-05-16 DIAGNOSIS — F1120 Opioid dependence, uncomplicated: Secondary | ICD-10-CM | POA: Diagnosis not present

## 2022-05-22 DIAGNOSIS — F1120 Opioid dependence, uncomplicated: Secondary | ICD-10-CM | POA: Diagnosis not present

## 2022-05-23 DIAGNOSIS — F1120 Opioid dependence, uncomplicated: Secondary | ICD-10-CM | POA: Diagnosis not present

## 2022-05-26 DIAGNOSIS — F1120 Opioid dependence, uncomplicated: Secondary | ICD-10-CM | POA: Diagnosis not present

## 2022-05-28 DIAGNOSIS — Z5181 Encounter for therapeutic drug level monitoring: Secondary | ICD-10-CM | POA: Diagnosis not present

## 2022-05-28 DIAGNOSIS — Z79899 Other long term (current) drug therapy: Secondary | ICD-10-CM | POA: Diagnosis not present

## 2022-05-28 DIAGNOSIS — F1120 Opioid dependence, uncomplicated: Secondary | ICD-10-CM | POA: Diagnosis not present

## 2022-05-30 DIAGNOSIS — F1120 Opioid dependence, uncomplicated: Secondary | ICD-10-CM | POA: Diagnosis not present

## 2022-06-02 DIAGNOSIS — Z5181 Encounter for therapeutic drug level monitoring: Secondary | ICD-10-CM | POA: Diagnosis not present

## 2022-06-02 DIAGNOSIS — Z79899 Other long term (current) drug therapy: Secondary | ICD-10-CM | POA: Diagnosis not present

## 2022-06-02 DIAGNOSIS — F1120 Opioid dependence, uncomplicated: Secondary | ICD-10-CM | POA: Diagnosis not present

## 2022-06-03 DIAGNOSIS — F1120 Opioid dependence, uncomplicated: Secondary | ICD-10-CM | POA: Diagnosis not present

## 2022-06-04 DIAGNOSIS — F1120 Opioid dependence, uncomplicated: Secondary | ICD-10-CM | POA: Diagnosis not present

## 2022-06-06 DIAGNOSIS — F1120 Opioid dependence, uncomplicated: Secondary | ICD-10-CM | POA: Diagnosis not present

## 2022-06-09 DIAGNOSIS — Z5181 Encounter for therapeutic drug level monitoring: Secondary | ICD-10-CM | POA: Diagnosis not present

## 2022-06-09 DIAGNOSIS — Z79899 Other long term (current) drug therapy: Secondary | ICD-10-CM | POA: Diagnosis not present

## 2022-06-09 DIAGNOSIS — F1120 Opioid dependence, uncomplicated: Secondary | ICD-10-CM | POA: Diagnosis not present

## 2022-06-10 DIAGNOSIS — F1120 Opioid dependence, uncomplicated: Secondary | ICD-10-CM | POA: Diagnosis not present

## 2022-06-12 DIAGNOSIS — F1120 Opioid dependence, uncomplicated: Secondary | ICD-10-CM | POA: Diagnosis not present

## 2022-06-13 DIAGNOSIS — F1120 Opioid dependence, uncomplicated: Secondary | ICD-10-CM | POA: Diagnosis not present

## 2022-06-17 DIAGNOSIS — F1120 Opioid dependence, uncomplicated: Secondary | ICD-10-CM | POA: Diagnosis not present

## 2022-06-17 DIAGNOSIS — Z5181 Encounter for therapeutic drug level monitoring: Secondary | ICD-10-CM | POA: Diagnosis not present

## 2022-06-17 DIAGNOSIS — Z79899 Other long term (current) drug therapy: Secondary | ICD-10-CM | POA: Diagnosis not present

## 2022-06-18 DIAGNOSIS — F1120 Opioid dependence, uncomplicated: Secondary | ICD-10-CM | POA: Diagnosis not present

## 2022-06-19 DIAGNOSIS — F1120 Opioid dependence, uncomplicated: Secondary | ICD-10-CM | POA: Diagnosis not present

## 2022-06-20 DIAGNOSIS — F1120 Opioid dependence, uncomplicated: Secondary | ICD-10-CM | POA: Diagnosis not present

## 2022-06-26 DIAGNOSIS — F1120 Opioid dependence, uncomplicated: Secondary | ICD-10-CM | POA: Diagnosis not present

## 2022-07-02 DIAGNOSIS — F1120 Opioid dependence, uncomplicated: Secondary | ICD-10-CM | POA: Diagnosis not present

## 2022-07-02 DIAGNOSIS — Z79899 Other long term (current) drug therapy: Secondary | ICD-10-CM | POA: Diagnosis not present

## 2022-07-02 DIAGNOSIS — Z5181 Encounter for therapeutic drug level monitoring: Secondary | ICD-10-CM | POA: Diagnosis not present

## 2022-07-03 DIAGNOSIS — F1120 Opioid dependence, uncomplicated: Secondary | ICD-10-CM | POA: Diagnosis not present

## 2022-07-04 DIAGNOSIS — F1120 Opioid dependence, uncomplicated: Secondary | ICD-10-CM | POA: Diagnosis not present

## 2022-07-07 DIAGNOSIS — Z79899 Other long term (current) drug therapy: Secondary | ICD-10-CM | POA: Diagnosis not present

## 2022-07-07 DIAGNOSIS — Z5181 Encounter for therapeutic drug level monitoring: Secondary | ICD-10-CM | POA: Diagnosis not present

## 2022-07-07 DIAGNOSIS — F1120 Opioid dependence, uncomplicated: Secondary | ICD-10-CM | POA: Diagnosis not present

## 2022-07-08 DIAGNOSIS — F1120 Opioid dependence, uncomplicated: Secondary | ICD-10-CM | POA: Diagnosis not present

## 2022-07-10 DIAGNOSIS — F1120 Opioid dependence, uncomplicated: Secondary | ICD-10-CM | POA: Diagnosis not present

## 2022-07-14 DIAGNOSIS — F1120 Opioid dependence, uncomplicated: Secondary | ICD-10-CM | POA: Diagnosis not present

## 2022-07-15 DIAGNOSIS — Z5181 Encounter for therapeutic drug level monitoring: Secondary | ICD-10-CM | POA: Diagnosis not present

## 2022-07-15 DIAGNOSIS — F1120 Opioid dependence, uncomplicated: Secondary | ICD-10-CM | POA: Diagnosis not present

## 2022-07-15 DIAGNOSIS — Z79899 Other long term (current) drug therapy: Secondary | ICD-10-CM | POA: Diagnosis not present

## 2022-07-16 DIAGNOSIS — F1120 Opioid dependence, uncomplicated: Secondary | ICD-10-CM | POA: Diagnosis not present

## 2022-07-17 DIAGNOSIS — F1120 Opioid dependence, uncomplicated: Secondary | ICD-10-CM | POA: Diagnosis not present

## 2022-07-18 DIAGNOSIS — F1120 Opioid dependence, uncomplicated: Secondary | ICD-10-CM | POA: Diagnosis not present

## 2022-07-25 DIAGNOSIS — F1120 Opioid dependence, uncomplicated: Secondary | ICD-10-CM | POA: Diagnosis not present

## 2022-07-25 DIAGNOSIS — Z79899 Other long term (current) drug therapy: Secondary | ICD-10-CM | POA: Diagnosis not present

## 2022-07-25 DIAGNOSIS — Z5181 Encounter for therapeutic drug level monitoring: Secondary | ICD-10-CM | POA: Diagnosis not present

## 2022-07-28 DIAGNOSIS — Z79899 Other long term (current) drug therapy: Secondary | ICD-10-CM | POA: Diagnosis not present

## 2022-07-28 DIAGNOSIS — Z5181 Encounter for therapeutic drug level monitoring: Secondary | ICD-10-CM | POA: Diagnosis not present

## 2022-07-28 DIAGNOSIS — F1120 Opioid dependence, uncomplicated: Secondary | ICD-10-CM | POA: Diagnosis not present

## 2022-08-19 DIAGNOSIS — Z79899 Other long term (current) drug therapy: Secondary | ICD-10-CM | POA: Diagnosis not present

## 2022-08-19 DIAGNOSIS — Z5181 Encounter for therapeutic drug level monitoring: Secondary | ICD-10-CM | POA: Diagnosis not present

## 2022-08-19 DIAGNOSIS — F1120 Opioid dependence, uncomplicated: Secondary | ICD-10-CM | POA: Diagnosis not present

## 2022-08-19 DIAGNOSIS — F1610 Hallucinogen abuse, uncomplicated: Secondary | ICD-10-CM | POA: Diagnosis not present

## 2022-08-19 DIAGNOSIS — Z743 Need for continuous supervision: Secondary | ICD-10-CM | POA: Diagnosis not present

## 2022-08-19 DIAGNOSIS — R4689 Other symptoms and signs involving appearance and behavior: Secondary | ICD-10-CM | POA: Diagnosis not present

## 2022-09-01 DIAGNOSIS — F14921 Cocaine use, unspecified with intoxication delirium: Secondary | ICD-10-CM | POA: Diagnosis not present

## 2022-09-01 DIAGNOSIS — E860 Dehydration: Secondary | ICD-10-CM | POA: Diagnosis not present

## 2022-09-01 DIAGNOSIS — F11229 Opioid dependence with intoxication, unspecified: Secondary | ICD-10-CM | POA: Diagnosis not present

## 2022-09-01 DIAGNOSIS — R7989 Other specified abnormal findings of blood chemistry: Secondary | ICD-10-CM | POA: Diagnosis not present

## 2022-09-01 DIAGNOSIS — F14121 Cocaine abuse with intoxication with delirium: Secondary | ICD-10-CM | POA: Diagnosis not present

## 2022-09-01 DIAGNOSIS — R0602 Shortness of breath: Secondary | ICD-10-CM | POA: Diagnosis not present

## 2022-09-01 DIAGNOSIS — F17210 Nicotine dependence, cigarettes, uncomplicated: Secondary | ICD-10-CM | POA: Diagnosis not present

## 2022-09-01 DIAGNOSIS — N179 Acute kidney failure, unspecified: Secondary | ICD-10-CM | POA: Diagnosis not present

## 2022-09-01 DIAGNOSIS — Z743 Need for continuous supervision: Secondary | ICD-10-CM | POA: Diagnosis not present

## 2022-09-01 DIAGNOSIS — R0789 Other chest pain: Secondary | ICD-10-CM | POA: Diagnosis not present

## 2022-09-02 DIAGNOSIS — R0602 Shortness of breath: Secondary | ICD-10-CM | POA: Diagnosis not present

## 2022-09-07 DIAGNOSIS — F99 Mental disorder, not otherwise specified: Secondary | ICD-10-CM | POA: Diagnosis not present

## 2022-09-07 DIAGNOSIS — Z743 Need for continuous supervision: Secondary | ICD-10-CM | POA: Diagnosis not present

## 2022-09-10 ENCOUNTER — Emergency Department (HOSPITAL_COMMUNITY): Payer: 59

## 2022-09-10 ENCOUNTER — Other Ambulatory Visit: Payer: Self-pay

## 2022-09-10 ENCOUNTER — Emergency Department (HOSPITAL_COMMUNITY)
Admission: EM | Admit: 2022-09-10 | Discharge: 2022-09-12 | Disposition: A | Discharge status: Home / Self Care | Payer: 59 | Attending: Emergency Medicine | Admitting: Emergency Medicine

## 2022-09-10 DIAGNOSIS — E876 Hypokalemia: Secondary | ICD-10-CM

## 2022-09-10 DIAGNOSIS — R4689 Other symptoms and signs involving appearance and behavior: Secondary | ICD-10-CM

## 2022-09-10 DIAGNOSIS — F1910 Other psychoactive substance abuse, uncomplicated: Secondary | ICD-10-CM | POA: Insufficient documentation

## 2022-09-10 DIAGNOSIS — Z0389 Encounter for observation for other suspected diseases and conditions ruled out: Secondary | ICD-10-CM | POA: Diagnosis not present

## 2022-09-10 DIAGNOSIS — Z1152 Encounter for screening for COVID-19: Secondary | ICD-10-CM | POA: Diagnosis not present

## 2022-09-10 DIAGNOSIS — R0989 Other specified symptoms and signs involving the circulatory and respiratory systems: Secondary | ICD-10-CM | POA: Diagnosis not present

## 2022-09-10 DIAGNOSIS — J329 Chronic sinusitis, unspecified: Secondary | ICD-10-CM | POA: Diagnosis not present

## 2022-09-10 DIAGNOSIS — F19959 Other psychoactive substance use, unspecified with psychoactive substance-induced psychotic disorder, unspecified: Secondary | ICD-10-CM | POA: Diagnosis not present

## 2022-09-10 DIAGNOSIS — F419 Anxiety disorder, unspecified: Secondary | ICD-10-CM | POA: Diagnosis not present

## 2022-09-10 DIAGNOSIS — R456 Violent behavior: Secondary | ICD-10-CM | POA: Diagnosis not present

## 2022-09-10 DIAGNOSIS — R4182 Altered mental status, unspecified: Secondary | ICD-10-CM | POA: Insufficient documentation

## 2022-09-10 DIAGNOSIS — R440 Auditory hallucinations: Secondary | ICD-10-CM | POA: Diagnosis not present

## 2022-09-10 HISTORY — DX: Anxiety disorder, unspecified: F41.9

## 2022-09-10 HISTORY — DX: Depression, unspecified: F32.A

## 2022-09-10 HISTORY — DX: Post-traumatic stress disorder, unspecified: F43.10

## 2022-09-10 LAB — RAPID URINE DRUG SCREEN, HOSP PERFORMED
Amphetamines: POSITIVE — AB
Barbiturates: NOT DETECTED
Benzodiazepines: POSITIVE — AB
Cocaine: NOT DETECTED
Opiates: NOT DETECTED
Tetrahydrocannabinol: POSITIVE — AB

## 2022-09-10 LAB — CBC WITH DIFFERENTIAL/PLATELET
Abs Immature Granulocytes: 0.17 10*3/uL — ABNORMAL HIGH (ref 0.00–0.07)
Abs Immature Granulocytes: 0.11 10*3/uL — ABNORMAL HIGH (ref 0.00–0.07)
Basophils Absolute: 0.1 10*3/uL (ref 0.0–0.1)
Basophils Absolute: 0.1 10*3/uL (ref 0.0–0.1)
Basophils Relative: 0 %
Basophils Relative: 0 %
Eosinophils Absolute: 0 10*3/uL (ref 0.0–0.5)
Eosinophils Absolute: 0.1 10*3/uL (ref 0.0–0.5)
Eosinophils Relative: 0 %
Eosinophils Relative: 1 %
HCT: 39.4 % (ref 39.0–52.0)
HCT: 38 % — ABNORMAL LOW (ref 39.0–52.0)
Hemoglobin: 13.4 g/dL (ref 13.0–17.0)
Hemoglobin: 12.9 g/dL — ABNORMAL LOW (ref 13.0–17.0)
Immature Granulocytes: 1 %
Immature Granulocytes: 1 %
Lymphocytes Relative: 10 %
Lymphocytes Relative: 20 %
Lymphs Abs: 2.7 10*3/uL (ref 0.7–4.0)
Lymphs Abs: 4.7 10*3/uL — ABNORMAL HIGH (ref 0.7–4.0)
MCH: 28.8 pg (ref 26.0–34.0)
MCH: 29.1 pg (ref 26.0–34.0)
MCHC: 34 g/dL (ref 30.0–36.0)
MCHC: 33.9 g/dL (ref 30.0–36.0)
MCV: 84.5 fL (ref 80.0–100.0)
MCV: 85.6 fL (ref 80.0–100.0)
Monocytes Absolute: 2 10*3/uL — ABNORMAL HIGH (ref 0.1–1.0)
Monocytes Absolute: 1.9 10*3/uL — ABNORMAL HIGH (ref 0.1–1.0)
Monocytes Relative: 7 %
Monocytes Relative: 8 %
Neutro Abs: 23.1 10*3/uL — ABNORMAL HIGH (ref 1.7–7.7)
Neutro Abs: 16.5 10*3/uL — ABNORMAL HIGH (ref 1.7–7.7)
Neutrophils Relative %: 82 %
Neutrophils Relative %: 70 %
Platelets: 305 10*3/uL (ref 150–400)
Platelets: 276 10*3/uL (ref 150–400)
RBC: 4.66 MIL/uL (ref 4.22–5.81)
RBC: 4.44 MIL/uL (ref 4.22–5.81)
RDW: 12.5 % (ref 11.5–15.5)
RDW: 12.5 % (ref 11.5–15.5)
WBC: 28 10*3/uL — ABNORMAL HIGH (ref 4.0–10.5)
WBC: 23.5 10*3/uL — ABNORMAL HIGH (ref 4.0–10.5)
nRBC: 0 % (ref 0.0–0.2)
nRBC: 0 % (ref 0.0–0.2)

## 2022-09-10 LAB — URINALYSIS, ROUTINE W REFLEX MICROSCOPIC
Bilirubin Urine: NEGATIVE
Glucose, UA: NEGATIVE mg/dL
Hgb urine dipstick: NEGATIVE
Ketones, ur: 20 mg/dL — AB
Leukocytes,Ua: NEGATIVE
Nitrite: NEGATIVE
Protein, ur: >=300 mg/dL — AB
Specific Gravity, Urine: 1.033 — ABNORMAL HIGH (ref 1.005–1.030)
pH: 5 (ref 5.0–8.0)

## 2022-09-10 LAB — BASIC METABOLIC PANEL
Anion gap: 13 (ref 5–15)
BUN: 18 mg/dL (ref 6–20)
CO2: 24 mmol/L (ref 22–32)
Calcium: 8.9 mg/dL (ref 8.9–10.3)
Chloride: 102 mmol/L (ref 98–111)
Creatinine, Ser: 0.96 mg/dL (ref 0.61–1.24)
GFR, Estimated: 53 mL/min — ABNORMAL LOW (ref >60)
Glucose, Bld: 93 mg/dL (ref 70–99)
Potassium: 2.5 mmol/L — CL (ref 3.5–5.1)
Sodium: 139 mmol/L (ref 135–145)

## 2022-09-10 LAB — ETHANOL: Alcohol, Ethyl (B): <10 mg/dL (ref <10)

## 2022-09-10 LAB — HEPATIC FUNCTION PANEL
ALT: 26 U/L (ref 0–44)
AST: 41 U/L (ref 15–41)
Albumin: 4.8 g/dL (ref 3.5–5.0)
Alkaline Phosphatase: 78 U/L (ref 38–126)
Bilirubin, Direct: 0.2 mg/dL (ref 0.0–0.2)
Indirect Bilirubin: 1.4 mg/dL — ABNORMAL HIGH (ref 0.3–0.9)
Total Bilirubin: 1.6 mg/dL — ABNORMAL HIGH (ref 0.3–1.2)
Total Protein: 7.9 g/dL (ref 6.5–8.1)

## 2022-09-10 LAB — AMMONIA: Ammonia: 26 umol/L (ref 9–35)

## 2022-09-10 LAB — POTASSIUM: Potassium: 3.1 mmol/L — ABNORMAL LOW (ref 3.5–5.1)

## 2022-09-10 MED ORDER — POTASSIUM CHLORIDE 10 MEQ/100ML IV SOLN
10.0000 meq | Freq: Once | INTRAVENOUS | Status: AC
  Administered 2022-09-10: 10 meq via INTRAVENOUS
  Filled 2022-09-10: qty 100

## 2022-09-10 MED ORDER — LORAZEPAM 2 MG/ML IJ SOLN
2.0000 mg | Freq: Once | INTRAMUSCULAR | Status: AC
  Administered 2022-09-10: 2 mg via INTRAVENOUS
  Filled 2022-09-10: qty 1

## 2022-09-10 MED ORDER — POTASSIUM CHLORIDE CRYS ER 20 MEQ PO TBCR
40.0000 meq | EXTENDED_RELEASE_TABLET | Freq: Once | ORAL | Status: AC
  Administered 2022-09-10: 40 meq via ORAL
  Filled 2022-09-10: qty 2

## 2022-09-10 MED ORDER — OLANZAPINE 5 MG PO TABS: 10.0000 mg | ORAL_TABLET | Freq: Once | ORAL | Status: DC

## 2022-09-10 MED ORDER — LACTATED RINGERS IV BOLUS
1000.0000 mL | Freq: Once | INTRAVENOUS | Status: AC
  Administered 2022-09-10: 1000 mL via INTRAVENOUS

## 2022-09-10 MED ORDER — OLANZAPINE 5 MG PO TABS
5.0000 mg | ORAL_TABLET | Freq: Once | ORAL | Status: AC
  Administered 2022-09-10: 5 mg via ORAL
  Filled 2022-09-10: qty 1

## 2022-09-10 MED ORDER — MAGNESIUM OXIDE -MG SUPPLEMENT 400 (240 MG) MG PO TABS
800.0000 mg | ORAL_TABLET | Freq: Once | ORAL | Status: AC
  Administered 2022-09-10: 800 mg via ORAL
  Filled 2022-09-10: qty 2

## 2022-09-10 MED ORDER — OLANZAPINE 5 MG PO TBDP
5.0000 mg | ORAL_TABLET | Freq: Every day | ORAL | Status: DC
  Administered 2022-09-10 – 2022-09-11 (×2): 5 mg via ORAL
  Filled 2022-09-10 (×2): qty 1

## 2022-09-10 MED ORDER — OLANZAPINE 5 MG PO TABS
5.0000 mg | ORAL_TABLET | Freq: Two times a day (BID) | ORAL | Status: DC
  Administered 2022-09-10 – 2022-09-11 (×2): 5 mg via ORAL
  Filled 2022-09-10 (×2): qty 1

## 2022-09-10 NOTE — ED Provider Notes (Addendum)
Patient cleared by behavioral health for discharge home.  Patient's repeat potassium was 3.1 so no longer critical.  Do not feel that patient needs potassium supplementation.   Vanetta Mulders, MD 09/10/22 2154    Vanetta Mulders, MD 09/10/22 2155  Canceled above.  Just got called by behavioral health again they have changed their mind about letting him go.  They are thinking that he still has some psychotic features.  They want a give him Zyprexa 10 mg tonight they will reassess tomorrow.  Want him to start on Zyprexa 5 mg twice daily starting tomorrow.    Vanetta Mulders, MD 09/10/22 2159

## 2022-09-10 NOTE — ED Notes (Signed)
Secure message sent to Counselors at [facility] as the TTS order was put in at 1122 this morning and have not heard anything, nor has this pt had TTS at this time.

## 2022-09-10 NOTE — ED Notes (Addendum)
Pt more aware/alert. Not having as many hallucinations and more calm. Talking with aunt Drue Flirt on phone at this time.

## 2022-09-10 NOTE — ED Notes (Signed)
Pt on telephone- request that nurse write down a number for a rehab facility that he had received from phone call.  nurse wrote down number and pt is calling it now.

## 2022-09-10 NOTE — ED Notes (Signed)
Pt more responsive to voice at this time. When asked his name he still states he doesn't know. Pt given water per request. C collar removed. Pt restful, did c/o pain to inside of mouth with swallowing the water.

## 2022-09-10 NOTE — ED Notes (Signed)
Still no response from [facility]- Anette Riedel, [facility] made aware- reports will follow up.

## 2022-09-10 NOTE — ED Notes (Signed)
Pt given another milk per request- pt calm and cooperative

## 2022-09-10 NOTE — ED Notes (Signed)
Called telesiiter back and waiting for them to configure their screen.

## 2022-09-10 NOTE — ED Notes (Signed)
Pt given several cups of water.

## 2022-09-10 NOTE — Progress Notes (Signed)
Roger [name]  Patient Name: Roger [name] MRN: [id number] DOB: Apr 20, 1990 DATE OF Consult: 09/10/2022  PRIMARY PSYCHIATRIC DIAGNOSES  Substance induced psychotic disorder - resolving; Polysubstance use disorder; Rule out delirium due to general medical condition - resolving  FORMULATION Based on my current evaluation and assessment of the patient, he is a 32 year old male with psychotic decompensation in the context of relapse on illicit substances. Following this evaluation, it was documented by primary team that patient was complaining of persistent auditory perceptual disturbances and is becoming more agitated. There is concern that patient is still experiencing acute psychosis following his illicit substance use. As such, this case was staffed again with primary provider, and agreed to maintain patient in observation overnight while continuing him on a scheduled antipsychotic and re-evaluate again tomorrow. Of [name], collateral from patient's aunt indicates that while he is sober, he does not exhibit symptoms of decompensated psychosis at baseline. Aunt asserts that his current psychotic symptoms are secondary to the illicit substance use. The patient's presentation is consistent with Substance induced psychotic disorder - resolving; Polysubstance use disorder; Rule out delirium due to general medical condition - resolving. Would re-evaluate patient again tomorrow to determine whether his psychotic symptoms are remitting; however, if he continues to demonstrate psychotic symptoms and is unable to contract for safety, he may require an inpatient psychiatric stay.   RECOMMENDATIONS  Medication recommendations:  Risks, benefits, side effects and alternatives to treatments reviewed:  -Consider giving olanzapine 10 mg at this time for mood stabilization and psychosis. Then tomorrow begin olanzapine 5 mg twice daily for mood stabilization and psychosis. Side effects include: Dizziness,  lightheadedness, drowsiness, nausea, vomiting, tiredness, excess saliva/drooling, blurred vision, weight gain, constipation, headache, restlessness (especially in the legs), shaking (tremor), muscle spasm, mask-like expression of the face, unusual uncontrolled movements called tardive dyskinesia (these uncontrolled movements are often of the face, mouth, tongue, arms, or legs),  and trouble sleeping may occur. [name] the following serious side effects:  fainting, suicidal thoughts, trouble swallowing, and seizures.    As needed medications to manage patient's acute symptoms while in hospital care: QTc is 452 ms as of 09/2022 -Consider diphenhydramine 25 mg three times daily as needed for anxiety or as needed for extrapyramidal side effects associated with antipsychotic treatment (muscle stiffness, parkinsonism)  -Maximize utilization of verbal de-escalation techniques, if attempts are unsuccessful and patient poses a threat to self and others: -Consider olanzapine (Zyprexa) 5 mg to 10 mg PO/IM with diphenhydramine 25 mg to 50 mg PO/IM every 8 hours as needed for severe agitation. Would offer patient the option of taking PO medication first, but if patient refuses then may administer IM medication as a last resort. Would not exceed 30 mg of olanzapine within a 24-hour period. Avoid co-administering intramuscular olanzapine with intravenous benzodiazepine, as giving both medications concurrently is associated with respiratory depression.   Non-Medication recommendations:  -Re-evaluate patient tomorrow given his mental status appears to not have cleared following his relapse on illicit substances as of this assessment  -[name]: Please stop all antipsychotic and QTc prolonging medications if patient's QTc is greater than 480 ms. Of [name], to decrease the risk of prolonged QTc, please maintain potassium and magnesium levels within normal ranges. -Agree with work up for organic causes of altered mentation and mood  dysregulation, consider the following if not already performed: CT of head; CBC and differential, basic metabolic profile, liver function tests (if abnormal consider ammonia level), urinalysis, urine toxicology screen, vitamin B12 level, vitamin  D level, TSH with reflex free T4   Observation recommendations:  per unit protocol for the management of psychotic patient   Recommendations: Communication: Treatment team members (and family members if applicable) who were involved in treatment/care discussions and planning, and with whom we spoke or engaged with via secure text/chat, include the following: Primary provider  Thank you for involving [place] in the care of this patient. If you have any additional questions or concerns, please call [phone] and ask for me or the provider on-call.  TELEPSYCHIATRY ATTESTATION & CONSENT  As the provider for this telehealth consult, I attest that I verified the patient's identity using two separate identifiers, introduced myself to the patient, provided my credentials, disclosed my location, and performed this encounter via a HIPAA-compliant, real-time, face-to-face, two-way, interactive audio and video platform and with the full consent and agreement of the patient (or guardian as applicable.)  [id number]. Telehealth provider physical location: home office in state of [place].  Video start time: 1920 (Central Time) Video end time: 1935 (Central Time)  IDENTIFYING DATA  Roger [name] is a 32 y.o. year-old male for whom a psychiatric consultation has been ordered by the primary provider. The patient was identified using two separate identifiers.  CHIEF COMPLAINT/REASON FOR CONSULT  Altered mental status   HISTORY OF PRESENT ILLNESS (HPI)  I evaluated the patient today face-to-face via secure, HIPAA-compliant telepsychiatric connection, and at the request of the primary treatment team. The reason for the telepsychiatric consultation is  that the patient is a 32 year old male with a history of polysubstance use disorder, anxiety, depression and posttraumatic stress disorder who presents for psychiatric evaluation given severe behavioral and thought disorganization in the context of urine toxicology screen with presumptive positives for amphetamines and tetrahydrocannabinol. Primary team is seeking psychotropic medication recommendations, safety evaluation to determine appropriateness for more intensive psychiatric services and diagnostic clarity as to the patient's presentation.   During one-on-one evaluation with this provider, patient was alert and oriented to self and generally to location and situation. The patient did not appear to be inappropriately internally preoccupied; patient's thought process was linear and concrete. Patient related that he was recently released from rehab and then relapsed. He related that he is remorseful for relapsing, explaining that he was "6 months sober" prior to this presentation to the ED. He denies suicidal and homicidal intent with plan; he is future oriented to engage in substance use treatment though [facility].  Per collateral from patient's aunt: The patient has a history of severe substance use. He was in intensive substance use rehabilitation from January 2024 until this week, when he came home. However, the patient went to see all of his "old friends" with whom he used illicit substances in the past and relapsed. Aunt asserted that she does not feel that patient is a risk to himself or others; she explained that patient's behavioral and thought disorganization are all attributed to the illicit substances that he uses. She is hopeful that he will re-engage in chemical dependency treatment again.   PAST PSYCHIATRIC HISTORY  Inpatient psychiatric treatment: per patient in 02/2022 he was admitted to a psychiatric hospital for management of acute psychosis in the context of intoxication  with illicit substances  Outpatient mental health treatment: per patient, he wishes to establish care with [facility]  Previous mental health diagnoses: per patient, anxiety, depression, PTSD and polysubstance use disorder  Suicide attempts: per patient, denies   Trauma history: patient asserts  a history of trauma  Otherwise as per HPI above.  PAST MEDICAL HISTORY  No past medical history on file.   HOME MEDICATIONS  Facility Ordered Medications  Medication   [COMPLETED] lactated ringers bolus 1,000 mL   [COMPLETED] potassium chloride SA (KLOR-CON M) CR tablet 40 mEq   [COMPLETED] potassium chloride 10 mEq in 100 mL IVPB   [COMPLETED] magnesium oxide (MAG-OX) tablet 800 mg   [COMPLETED] LORazepam (ATIVAN) injection 2 mg   OLANZapine zydis (ZYPREXA) disintegrating tablet 5 mg   [COMPLETED] potassium chloride SA (KLOR-CON M) CR tablet 40 mEq   OLANZapine (ZYPREXA) tablet 5 mg   [COMPLETED] OLANZapine (ZYPREXA) tablet 5 mg     ALLERGIES  Not on File  SOCIAL & SUBSTANCE USE HISTORY  Social History   Socioeconomic History   Marital status: Single    Spouse name: Not on file   Number of children: Not on file   Years of education: Not on file   Highest education level: Not on file  Occupational History   Not on file  Tobacco Use   Smoking status: Not on file   Smokeless tobacco: Not on file  Substance and Sexual Activity   Alcohol use: Not on file   Drug use: Not on file   Sexual activity: Not on file  Other Topics Concern   Not on file  Social History Narrative   Not on file   Social Determinants of Health   Financial Resource Strain: Not on file  Food Insecurity: Not on file  Transportation Needs: Not on file  Physical Activity: Not on file  Stress: Not on file  Social Connections: Not on file   Social History   Tobacco Use  Smoking Status Not on file  Smokeless Tobacco Not on file   Social History   Substance and Sexual Activity  Alcohol Use  Not on file   Social History   Substance and Sexual Activity  Drug Use Not on file    Additional pertinent information methamphetamine and cannabis use.  FAMILY HISTORY  No family history on file. Family Psychiatric History (if known):  none disclosed   MENTAL STATUS EXAM (MSE)  Presentation  General Appearance:  Appropriate for Environment  Eye Contact: Fair  Speech: Normal Rate  Speech Volume: Normal  Handedness:No data recorded  Mood and Affect  Mood: Anxious  Affect: Constricted   Thought Process  Thought Processes: Goal Directed  Descriptions of Associations: Intact  Orientation: Other (comment) (oriented to self, location but not fully to situation)  Thought Content: Delusions  History of Schizophrenia/Schizoaffective disorder:No data recorded Duration of Psychotic Symptoms:No data recorded Hallucinations: Hallucinations: Auditory  Ideas of Reference: Paranoia; Percusatory  Suicidal Thoughts: Suicidal Thoughts: No  Homicidal Thoughts: Homicidal Thoughts: No   Sensorium  Memory: Immediate Poor  Judgment: Fair  Insight: Fair   Chartered certified accountant: Fair  Attention Span: Fair  Recall: Fair  Fund of Knowledge: Fair  Language: Fair   Psychomotor Activity  Psychomotor Activity: Psychomotor Activity: Psychomotor Retardation   Assets  Assets: Desire for Improvement   Sleep  Sleep: Sleep: Poor   VITALS  Blood pressure 122/71, pulse 65, temperature 98.1 F (36.7 C), temperature source Oral, resp. rate 16, SpO2 98%.  LABS  Admission on 09/10/2022  Component Date Value Ref Range Status   WBC 09/10/2022 28.0 (H)  4.0 - 10.5 K/uL Final   RBC 09/10/2022 4.66  4.22 - 5.81 MIL/uL Final   Hemoglobin 09/10/2022 13.4  13.0 - 17.0  g/dL Final   HCT 33/29/5188 39.4  39.0 - 52.0 % Final   MCV 09/10/2022 84.5  80.0 - 100.0 fL Final   MCH 09/10/2022 28.8  26.0 - 34.0 pg Final   MCHC 09/10/2022 34.0  30.0 -  36.0 g/dL Final   RDW 41/66/0630 12.5  11.5 - 15.5 % Final   Platelets 09/10/2022 305  150 - 400 K/uL Final   nRBC 09/10/2022 0.0  0.0 - 0.2 % Final   Neutrophils Relative % 09/10/2022 82  % Final   Neutro Abs 09/10/2022 23.1 (H)  1.7 - 7.7 K/uL Final   Lymphocytes Relative 09/10/2022 10  % Final   Lymphs Abs 09/10/2022 2.7  0.7 - 4.0 K/uL Final   Monocytes Relative 09/10/2022 7  % Final   Monocytes Absolute 09/10/2022 2.0 (H)  0.1 - 1.0 K/uL Final   Eosinophils Relative 09/10/2022 0  % Final   Eosinophils Absolute 09/10/2022 0.0  0.0 - 0.5 K/uL Final   Basophils Relative 09/10/2022 0  % Final   Basophils Absolute 09/10/2022 0.1  0.0 - 0.1 K/uL Final   WBC Morphology 09/10/2022 TOXIC GRANULATION   Final   Smear Review 09/10/2022 MORPHOLOGY UNREMARKABLE   Final   Immature Granulocytes 09/10/2022 1  % Final   Abs Immature Granulocytes 09/10/2022 0.17 (H)  0.00 - 0.07 K/uL Final   Ovalocytes 09/10/2022 PRESENT   Final   Performed at [facility], [address], [place], [place] [address]   Color, Urine 09/10/2022 AMBER (A)  YELLOW Final   BIOCHEMICALS MAY BE AFFECTED BY COLOR   APPearance 09/10/2022 HAZY (A)  CLEAR Final   Specific Gravity, Urine 09/10/2022 1.033 (H)  1.005 - 1.030 Final   pH 09/10/2022 5.0  5.0 - 8.0 Final   Glucose, UA 09/10/2022 NEGATIVE  NEGATIVE mg/dL Final   Hgb urine dipstick 09/10/2022 NEGATIVE  NEGATIVE Final   Bilirubin Urine 09/10/2022 NEGATIVE  NEGATIVE Final   Ketones, ur 09/10/2022 20 (A)  NEGATIVE mg/dL Final   Protein, ur 93/23/5573 >=300 (A)  NEGATIVE mg/dL Final   Nitrite 22/03/5425 NEGATIVE  NEGATIVE Final   Leukocytes,Ua 09/10/2022 NEGATIVE  NEGATIVE Final   RBC / HPF 09/10/2022 6-10  0 - 5 RBC/hpf Final   WBC, UA 09/10/2022 0-5  0 - 5 WBC/hpf Final   Bacteria, UA 09/10/2022 FEW (A)  NONE SEEN Final   Squamous Epithelial / HPF 09/10/2022 0-5  0 - 5 /HPF Final   Mucus 09/10/2022 PRESENT   Final   Hyaline Casts, UA 09/10/2022 PRESENT   Final    Sperm, UA 09/10/2022 PRESENT   Final   Performed at [facility], [address], [place], [place] [address]   Ammonia 09/10/2022 26  9 - 35 umol/L Final   Performed at [facility], [address], [place], [place] [address]   Sodium 09/10/2022 139  135 - 145 mmol/L Final   Potassium 09/10/2022 2.5 (LL)  3.5 - 5.1 mmol/L Final   CRITICAL RESULT CALLED TO, READ BACK BY AND VERIFIED WITH FOWLER,D AT 7:45AM ON 09/10/22 BY FESTERMAN,C   Chloride 09/10/2022 102  98 - 111 mmol/L Final   CO2 09/10/2022 24  22 - 32 mmol/L Final   Glucose, Bld 09/10/2022 93  70 - 99 mg/dL Final   Glucose reference range applies only to samples taken after fasting for at least 8 hours.   BUN 09/10/2022 18  6 - 20 mg/dL Final   QA FLAGS AND/OR RANGES MODIFIED BY DEMOGRAPHIC UPDATE ON 08/07  AT 1151   Creatinine, Ser 09/10/2022 0.96  0.61 - 1.24 mg/dL Final   Calcium 09/81/1914 8.9  8.9 - 10.3 mg/dL Final   GFR, Estimated 09/10/2022 53 (L)  >60 mL/min Final   Comment: ([name]) Calculated using the CKD-EPI Creatinine Equation (2021)    Anion gap 09/10/2022 13  5 - 15 Final   Performed at [facility], [address], [place], [place] [address]   Total Protein 09/10/2022 7.9  6.5 - 8.1 g/dL Final   Albumin 62/13/0865 4.8  3.5 - 5.0 g/dL Final   AST 78/46/9629 41  15 - 41 U/L Final   ALT 09/10/2022 26  0 - 44 U/L Final   Alkaline Phosphatase 09/10/2022 78  38 - 126 U/L Final   Total Bilirubin 09/10/2022 1.6 (H)  0.3 - 1.2 mg/dL Final   Bilirubin, Direct 09/10/2022 0.2  0.0 - 0.2 mg/dL Final   Indirect Bilirubin 09/10/2022 1.4 (H)  0.3 - 0.9 mg/dL Final   Performed at [facility], [address], [place], [place] [address]   Opiates 09/10/2022 NONE DETECTED  NONE DETECTED Final   Cocaine 09/10/2022 NONE DETECTED  NONE DETECTED Final   Benzodiazepines 09/10/2022 POSITIVE (A)  NONE DETECTED Final   Amphetamines 09/10/2022 POSITIVE (A)  NONE DETECTED Final   Tetrahydrocannabinol 09/10/2022 POSITIVE (A)  NONE DETECTED Final    Barbiturates 09/10/2022 NONE DETECTED  NONE DETECTED Final   Comment: ([name]) DRUG SCREEN FOR MEDICAL PURPOSES ONLY.  IF CONFIRMATION IS NEEDED FOR ANY PURPOSE, NOTIFY LAB WITHIN 5 DAYS.  LOWEST DETECTABLE LIMITS FOR URINE DRUG SCREEN Drug Class                     Cutoff (ng/mL) Amphetamine and metabolites    1000 Barbiturate and metabolites    200 Benzodiazepine                 200 Opiates and metabolites        300 Cocaine and metabolites        300 THC                            50 Performed at [facility], [address] [place], [place] [address]    Alcohol, Ethyl (B) 09/10/2022 <10  <10 mg/dL Final   Comment: ([name]) Lowest detectable limit for serum alcohol is 10 mg/dL.  For medical purposes only. Performed at [facility], [address], [place], [place] [address]    Potassium 09/10/2022 3.1 (L)  3.5 - 5.1 mmol/L Final   Performed at [facility], [address] [place], [place] [address]   WBC 09/10/2022 23.5 (H)  4.0 - 10.5 K/uL Final   RBC 09/10/2022 4.44  4.22 - 5.81 MIL/uL Final   Hemoglobin 09/10/2022 12.9 (L)  13.0 - 17.0 g/dL Final   HCT 40/34/7425 38.0 (L)  39.0 - 52.0 % Final   MCV 09/10/2022 85.6  80.0 - 100.0 fL Final   MCH 09/10/2022 29.1  26.0 - 34.0 pg Final   MCHC 09/10/2022 33.9  30.0 - 36.0 g/dL Final   RDW 95/63/8756 12.5  11.5 - 15.5 % Final   Platelets 09/10/2022 276  150 - 400 K/uL Final   nRBC 09/10/2022 0.0  0.0 - 0.2 % Final   Neutrophils Relative % 09/10/2022 70  % Final   Neutro Abs 09/10/2022 16.5 (H)  1.7 - 7.7 K/uL Final   Lymphocytes Relative 09/10/2022 20  %  Final   Lymphs Abs 09/10/2022 4.7 (H)  0.7 - 4.0 K/uL Final   Monocytes Relative 09/10/2022 8  % Final   Monocytes Absolute 09/10/2022 1.9 (H)  0.1 - 1.0 K/uL Final   Eosinophils Relative 09/10/2022 1  % Final   Eosinophils Absolute 09/10/2022 0.1  0.0 - 0.5 K/uL Final   Basophils Relative 09/10/2022 0  % Final   Basophils Absolute 09/10/2022 0.1  0.0 - 0.1 K/uL Final    Immature Granulocytes 09/10/2022 1  % Final   Abs Immature Granulocytes 09/10/2022 0.11 (H)  0.00 - 0.07 K/uL Final   Performed at [facility], [address], [place], [place] [address]    PSYCHIATRIC REVIEW OF SYSTEMS (ROS)  ROS: Notable for the following relevant positive findings: ROS  Additional findings:      Musculoskeletal: No abnormal movements observed      Gait & Station: Laying/Sitting      Pain Screening: Present - mild to moderate      Nutrition & Dental Concerns: Dental problems  RISK FORMULATION/ASSESSMENT  Is the patient experiencing any suicidal or homicidal ideations: No     Protective factors considered for safety management: Patient is not endorsing current suicidal and homicidal ideations, future orientation, willingness to engage in mental health treatment, no history of suicide attempts  Risk factors/concerns considered for safety management:  Substance abuse/dependence Physical illness/chronic pain Impulsivity Male gender Unmarried  Is there a safety management plan with the patient and treatment team to minimize risk factors and promote protective factors: Yes           Explain: Would re-evaluate patient again tomorrow to determine whether his psychotic symptoms are remitting; however, if he continues to demonstrate psychotic symptoms and is unable to contract for safety, he may require an inpatient psychiatric stay. Agree with maintaining patient in observation overnight Is crisis care placement or psychiatric hospitalization recommended: will re-evaluate tomorrow to determine this     Based on my current evaluation and risk assessment, patient is determined at this time to be at:  Moderate Risk  *RISK ASSESSMENT Risk assessment is a dynamic process; it is possible that this patient's condition, and risk level, may change. This should be re-evaluated and managed over time as appropriate. Please re-consult psychiatric consult services if additional  assistance is needed in terms of risk assessment and management. If your team decides to discharge this patient, please advise the patient how to best access emergency psychiatric services, or to call 911, if their condition worsens or they feel unsafe in any way.   Rodena Medin, MD Telepsychiatry Consult Services

## 2022-09-10 NOTE — ED Notes (Signed)
Patient being evaluated by TTS °

## 2022-09-10 NOTE — ED Notes (Signed)
Pt only responding to pain. Vs wdl. Color wnl. Non diaphoretic. Dried blood noted to lips. C collar in place. Pt restful

## 2022-09-10 NOTE — BH Assessment (Signed)
Clinician contacted [facility] and spoke to coordinator Sierrah.  She said that Dr. Maryelizabeth Kaufmann would be able to see the patient at 20:00.  RN Claris Gower informed along with [facility] coordinators and Dr. Deretha Emory via secure messaging.

## 2022-09-10 NOTE — ED Notes (Signed)
Pt ambulatory to bathroom

## 2022-09-10 NOTE — ED Notes (Signed)
[facility] has not called back- oncoming EMTP made aware and has number.

## 2022-09-10 NOTE — ED Provider Notes (Signed)
[place] EMERGENCY DEPARTMENT AT [facility] Provider Note   CSN: [id number] Arrival date & time: 09/10/22  0555     History  Chief Complaint  Patient presents with   Aggressive Behavior    Roger [name] is a 32 y.o. male.  HPI Patient presents for altered mental status.  Medical history is unknown but acquaintance on scene does report that he uses drugs, although they were unable to specify which drugs.  Patient reportedly had acute onset of bizarre behavior earlier this morning.  His acquaintance found him in the bathroom, eating toilet paper, and smashing his face on the toilet.  911 was called.  Police arrived on scene and patient was combative and agitated.  EMS arrived and gave 5 mg of intramuscular Versed and 5 mg of intramuscular Haldol.  He was placed in restraints.  Halfway through transit to the hospital, patient's agitation resolved and he has been somnolent since that time.  EMS noted normal vital signs and normal CBG.    Home Medications Prior to Admission medications   Not on File      Allergies    Patient has no allergy information on record.    Review of Systems   Review of Systems  Unable to perform ROS: Mental status change    Physical Exam Updated Vital Signs BP 119/77   Pulse 100   Temp 98.8 F (37.1 C)   Resp (!) 22   SpO2 94%  Physical Exam Vitals and nursing note reviewed.  Constitutional:      General: He is not in acute distress.    Appearance: Normal appearance. He is well-developed. He is not ill-appearing, toxic-appearing or diaphoretic.  HENT:     Head: Normocephalic and atraumatic.     Right Ear: External ear normal.     Left Ear: External ear normal.     Nose: Nose normal.     Mouth/Throat:     Comments: Very poor dentition.  Dried blood on lips with no active intraoral bleeding. Eyes:     Conjunctiva/sclera: Conjunctivae normal.     Pupils: Pupils are equal, round, and reactive to light.  Cardiovascular:      Rate and Rhythm: Normal rate and regular rhythm.     Heart sounds: No murmur heard. Pulmonary:     Effort: Pulmonary effort is normal. No respiratory distress.     Breath sounds: Normal breath sounds. No wheezing or rales.  Chest:     Chest wall: No tenderness.  Abdominal:     General: There is no distension.     Palpations: Abdomen is soft.     Tenderness: There is no abdominal tenderness.  Musculoskeletal:        General: No swelling or deformity.     Cervical back: Neck supple.     Right lower leg: No edema.     Left lower leg: No edema.  Skin:    General: Skin is warm and dry.     Coloration: Skin is not jaundiced or pale.  Neurological:     Mental Status: He is alert.     GCS: GCS eye subscore is 1. GCS verbal subscore is 1. GCS motor subscore is 5.     Comments: Moving all extremities     ED Results / Procedures / Treatments   Labs (all labs ordered are listed, but only abnormal results are displayed) Labs Reviewed  CBC WITH DIFFERENTIAL/PLATELET  URINALYSIS, ROUTINE W REFLEX MICROSCOPIC  AMMONIA  BASIC METABOLIC  PANEL  HEPATIC FUNCTION PANEL  RAPID URINE DRUG SCREEN, HOSP PERFORMED  ETHANOL  CBG MONITORING, ED  I-STAT CHEM 8, ED    EKG None  Radiology No results found.  Procedures Procedures    Medications Ordered in ED Medications  lactated ringers bolus 1,000 mL (has no administration in time range)    ED Course/ Medical Decision Making/ A&P                                 Medical Decision Making Amount and/or Complexity of Data Reviewed Labs: ordered. Radiology: ordered.   This patient presents to the ED for concern of altered mental status, this involves an extensive number of treatment options, and is a complaint that carries with it a high risk of complications and morbidity.  The differential diagnosis includes drug intoxication, head trauma, psychosis   Co morbidities that complicate the patient evaluation  Unknown   Additional  history obtained:  Additional history obtained from EMS External records from outside source obtained and reviewed including EMR   Lab Tests:  I Ordered, and personally interpreted labs.  The pertinent results include: (Results pending at time of signout)   Imaging Studies ordered:  I ordered imaging studies including chest x-ray, CT of head, face, cervical spine I independently visualized and interpreted imaging which showed (pending at time of signout) I agree with the radiologist interpretation   Cardiac Monitoring: / EKG:  The patient was maintained on a cardiac monitor.  I personally viewed and interpreted the cardiac monitored which showed an underlying rhythm of: Sinus rhythm   Problem List / ED Course / Critical interventions / Medication management  Patient with history of substance abuse, presenting for acute onset of bizarre behavior, agitation, combativeness this morning.  EMS provided 5 mg of intramuscular Versed and Haldol prior to arrival.  On arrival, patient is GCS 7.  He has some dried blood on his lips with no identifiable source of any active bleeding.  He has very poor dentition.  He has no obvious areas of deformities.  Patient was placed on bedside cardiac monitor with ETCO2 monitoring.  Workup was initiated.  Care of patient was signed out to oncoming ED provider. I ordered medication including IV fluids for hydration Reevaluation of the patient after these medicines showed that the patient stayed the same I have reviewed the patients home medicines and have made adjustments as needed   Social Determinants of Health:  Unknown         Final Clinical Impression(s) / ED Diagnoses Final diagnoses:  Altered behavior    Rx / DC Orders ED Discharge Orders     None         Gloris Manchester, MD 09/10/22 [phone]

## 2022-09-10 NOTE — ED Triage Notes (Signed)
Pt was found by PD banging head on toilet and eating toilet paper. Pt has history of drug abuse. Pt has blood in mouth and is missing some teeth. Pt received 5 haldol and 5mg  versed with EMS. Pt is asleep on arrival. NAD NARD

## 2022-09-10 NOTE — ED Notes (Signed)
Verified olanzapine dose with Dr Deretha Emory, and informed that Arvilla Market, NP had already ordered 5mg  for pt and that it had been given already. Dose was changed by Dr Deretha Emory. Will administer updated dose as instructed.

## 2022-09-10 NOTE — ED Provider Notes (Signed)
  Physical Exam  BP 122/80   Pulse 78   Temp 98.3 F (36.8 C) (Oral)   Resp 18   SpO2 100%   Physical Exam Vitals and nursing note reviewed.  Constitutional:      General: He is not in acute distress.    Appearance: He is well-developed.  HENT:     Head: Normocephalic and atraumatic.  Eyes:     Conjunctiva/sclera: Conjunctivae normal.  Cardiovascular:     Rate and Rhythm: Regular rhythm. Tachycardia present.     Heart sounds: No murmur heard. Pulmonary:     Effort: Pulmonary effort is normal. No respiratory distress.  Musculoskeletal:        General: No swelling.     Cervical back: Neck supple.  Skin:    General: Skin is warm and dry.  Neurological:     Mental Status: He is alert.  Psychiatric:        Mood and Affect: Mood normal.     Procedures  .Critical Care  Performed by: Glendora Score, MD Authorized by: Glendora Score, MD   Critical care provider statement:    Critical care time (minutes):  30   Critical care was necessary to treat or prevent imminent or life-threatening deterioration of the following conditions:  Metabolic crisis (Psychosis requiring chemical restraint)   Critical care was time spent personally by me on the following activities:  Development of treatment plan with patient or surrogate, discussions with consultants, evaluation of patient's response to treatment, examination of patient, ordering and review of laboratory studies, ordering and review of radiographic studies, ordering and performing treatments and interventions, pulse oximetry, re-evaluation of patient's condition and review of old charts   ED Course / MDM    Medical Decision Making Amount and/or Complexity of Data Reviewed Labs: ordered. Radiology: ordered.  Risk OTC drugs. Prescription drug management.   Patient received an handoff.  Aggressive behavior pending CT evaluation and laboratory evaluation.  Required chemical restraint by EMS.  Laboratory valuation with severe  hypokalemia to 2.5, significant leukocytosis to 28, UDS positive for benzodiazepines, amphetamines and THC.  Trauma imaging reassuringly negative.  Repeat potassium is 3.1.  Leukocytosis downtrending on repeat.  Patient metabolized underlying substances but awoke very agitated and actively hallucinating.  Required an additional dose of Ativan.  TTS consulted for persistent psychosis.  Patient then signed out to oncoming provider.  Please see provider sign up for continuation of workup    Potassium aggressively repleted and on   Glendora Score, MD 09/10/22 2047

## 2022-09-10 NOTE — ED Notes (Addendum)
Pt calls this nurse to bedside, saying he is hearing more than one voice and it is starting to agitate him- says "I'm trying not to get aggravated, but it is hard. Pt reports the voices are "laughing at him and pointing" Pt says the person he talked with over telepsych said he could be given an antipsychotic medication to help with that, but did not know the name, informed pt that I would reach out to [facility] and see what medication is recommended.

## 2022-09-10 NOTE — Discharge Instructions (Addendum)
Cleared by behavioral health for discharge home.  Return for any new or worse symptoms.  Potassium was a little low here at 3.1 recommend that she have it rechecked in a week or 2.

## 2022-09-10 NOTE — ED Notes (Signed)
Pt asking for food, reports to this nurse he is hearing the voices again and trying to ignore them. Pt says you can pray with me anytime you want, as nurse had offered this a few hours ago. Nurse prayed with pt per pt request. Pt given two sandwiches, water, and carton of milk, pt verbalized appreciation for everything. TTS has been completed. Pt calm and cooperative at this time. Pt alert and oriented x 4.

## 2022-09-10 NOTE — ED Notes (Signed)
Tele sitter called and cart in room- staff at tele monitoring says they will call back

## 2022-09-11 ENCOUNTER — Encounter (HOSPITAL_COMMUNITY): Payer: Self-pay | Admitting: *Deleted

## 2022-09-11 DIAGNOSIS — F1910 Other psychoactive substance abuse, uncomplicated: Secondary | ICD-10-CM

## 2022-09-11 DIAGNOSIS — R443 Hallucinations, unspecified: Secondary | ICD-10-CM

## 2022-09-11 LAB — CBC WITH DIFFERENTIAL/PLATELET
Abs Immature Granulocytes: 0.06 10*3/uL (ref 0.00–0.07)
Basophils Absolute: 0.1 10*3/uL (ref 0.0–0.1)
Basophils Relative: 0 %
Eosinophils Absolute: 0.7 10*3/uL — ABNORMAL HIGH (ref 0.0–0.5)
Eosinophils Relative: 5 %
HCT: 38.6 % — ABNORMAL LOW (ref 39.0–52.0)
Hemoglobin: 13.1 g/dL (ref 13.0–17.0)
Immature Granulocytes: 0 %
Lymphocytes Relative: 31 %
Lymphs Abs: 4.5 10*3/uL — ABNORMAL HIGH (ref 0.7–4.0)
MCH: 28.7 pg (ref 26.0–34.0)
MCHC: 33.9 g/dL (ref 30.0–36.0)
MCV: 84.5 fL (ref 80.0–100.0)
Monocytes Absolute: 0.8 10*3/uL (ref 0.1–1.0)
Monocytes Relative: 6 %
Neutro Abs: 8.4 10*3/uL — ABNORMAL HIGH (ref 1.7–7.7)
Neutrophils Relative %: 58 %
Platelets: 298 10*3/uL (ref 150–400)
RBC: 4.57 MIL/uL (ref 4.22–5.81)
RDW: 12.5 % (ref 11.5–15.5)
WBC: 14.5 10*3/uL — ABNORMAL HIGH (ref 4.0–10.5)
nRBC: 0 % (ref 0.0–0.2)

## 2022-09-11 MED ORDER — RISPERIDONE 1 MG PO TBDP
1.0000 mg | ORAL_TABLET | Freq: Two times a day (BID) | ORAL | Status: DC
  Administered 2022-09-11: 1 mg via ORAL
  Filled 2022-09-11: qty 1

## 2022-09-11 MED ORDER — IBUPROFEN 400 MG PO TABS
600.0000 mg | ORAL_TABLET | Freq: Three times a day (TID) | ORAL | Status: DC | PRN
  Administered 2022-09-11: 600 mg via ORAL
  Filled 2022-09-11: qty 2

## 2022-09-11 MED ORDER — ACETAMINOPHEN 325 MG PO TABS
650.0000 mg | ORAL_TABLET | Freq: Four times a day (QID) | ORAL | Status: DC | PRN
  Administered 2022-09-12: 650 mg via ORAL
  Filled 2022-09-11: qty 2

## 2022-09-11 MED ORDER — DIPHENHYDRAMINE HCL 25 MG PO CAPS: 50.0000 mg | ORAL_CAPSULE | Freq: Two times a day (BID) | ORAL | Status: DC | PRN

## 2022-09-11 NOTE — ED Notes (Signed)
Pt c/o hearing voices that are continuing to get louder.

## 2022-09-11 NOTE — ED Notes (Signed)
Pt endorses hearing voices and states he would like his medication for that. Pt unable to articulate what exactly voices are saying at this time, pt just says "hearing voices"

## 2022-09-11 NOTE — ED Notes (Signed)
Spoke with Cassandra with [facility] with possible acceptance tomorrow.

## 2022-09-11 NOTE — ED Notes (Signed)
TTS re-evaluation in progress at this time.

## 2022-09-11 NOTE — ED Provider Notes (Signed)
Emergency Medicine Observation Re-evaluation Note  Roger [name] is a 32 y.o. male, seen on rounds today.  Pt initially presented to the ED for complaints of Aggressive Behavior Currently, the patient is asleep.  Physical Exam  BP 120/71   Pulse 98   Temp 98.1 F (36.7 C) (Oral)   Resp (!) 21   SpO2 100%  Physical Exam General: asleep Cardiac: asleep Lungs: asleep Psych: asleep  ED Course / MDM  EKG:EKG Interpretation Date/Time:  Wednesday September 10 2022 06:04:11 EDT Ventricular Rate:  100 PR Interval:  137 QRS Duration:  100 QT Interval:  350 QTC Calculation: 452 R Axis:   54  Text Interpretation: Sinus tachycardia Probable inferior infarct, old No old tracing to compare Confirmed by Dione Booze ([id number]) on 09/11/2022 5:09:17 AM  I have reviewed the labs performed to date as well as medications administered while in observation.  Recent changes in the last 24 hours include psychiatric medication adjustment.  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    Pricilla Loveless, MD 09/11/22 [phone]

## 2022-09-11 NOTE — Progress Notes (Signed)
[facility] is reporting that pt white blood count needs to trend down. Cassandra from [facility] has spoke with the Charge RN at [facility]. Nursing advised that labs will be repeated. CSW will inform care team.   Maryjean Ka, MSW, [facility] 09/11/2022 10:15 PM

## 2022-09-11 NOTE — ED Notes (Addendum)
Dois Davenport, intake RN from [facility] called asking about pt WBC being high, requesting a repeat panel be drawn, Dr Maple Hudson gave verbal order for CBC, WBC result has came back significantly lower- Dois Davenport says they will accept pt and she will update [place], SW. Soledad Gerlach primary nurse made aware.

## 2022-09-11 NOTE — Progress Notes (Signed)
Pt was accepted to [facility] TOMORROW 09/12/2022; Bed Assignment Deatra Canter A  Pt meets inpatient criteria per Inez Catalina  Attending Physician will be Dr. Norton Pastel, MD  Report can be called to: [phone] or [phone]  Pt can arrive after: 8:00am  Care Team notified: Arlean Hopping [facility] Guss [place]unde, [address] [place], [place] 09/11/2022 @ 5:38 PM

## 2022-09-11 NOTE — Consult Note (Addendum)
Telepsych Consultation   Reason for Consult:  Hallucinations Referring Physician:  EDP Location of [facility]: [facility] Location of Provider: Other: [facility]  Patient Identification: Liang Stolle MRN:  [id number] Principal Diagnosis: <principal problem not specified> Diagnosis:  Active Problems:   * No active hospital problems. *   Total Time spent with patient: 30 minutes  Subjective:   Chelsey Alix is a 32 y.o. male patient admitted with hallucinations in the context of polysubstance abuse. Patient seen and reassessed by this provider via telepsychiatry, following a full evaluation by clinician yesterday night.   Currently on interview, the patient endorses symptoms of psychosis. He describes familiar AH that have increased in intensity. He states the voices are riduculing him, telling him he doesn't desrve to live. At times he is visibly distraught by the voices and smacks ear and face to get rid of them. He reports that he usually stays busy to drown out the voices.  The patient reports he does not remember onset of current episode prior to ED presentation. Describes mood as "tired of the voices." Patient unable to quantify how much or last use of his substances, and often derails. Endorses prior psychiatric diagnoses of polysubstance use disorder, depression, PTSD. He denies any diagnosis of schizophrenia or schizoaffective. Denies current withdrawal symptoms, suicidal ideation, homicidal ideation, auditory/visual hallucination, and self-harm. 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 Patient endorses adequate appetite and endorses sleep disturbance.He does appear to be responding to internal stimuli.    Patient denies a history of seizure, denies a history of SI/HI, and denies a history of suicide attempt.    Patient was hyperfocused on discharge to [facility], and has a bed offer. It is unclear at this time if patient will  require psychiatric hospitalization as he appears to be improving to his baseline relatively quickly after methamphetamines are metabolized. Much of patient's social and psychiatric history was obtained last night, see note.    HPI:  During one-on-one evaluation with this provider, patient was alert and oriented to self and generally to location and situation. The patient did not appear to be inappropriately internally preoccupied; patient's thought process was linear and concrete. Patient related that he was recently released from rehab and then relapsed. He related that he is remorseful for relapsing, explaining that he was "6 months sober" prior to this presentation to the ED. He denies suicidal and homicidal intent with plan; he is future oriented to engage in substance use treatment though [facility].   Per collateral from patient's aunt: The patient has a history of severe substance use. He was in intensive substance use rehabilitation from January 2024 until this week, when he came home. However, the patient went to see all of his "old friends" with whom he used illicit substances in the past and relapsed. Aunt asserted that she does not feel that patient is a risk to himself or others; she explained that patient's behavioral and thought disorganization are all attributed to the illicit substances that he uses. She is hopeful that he will re-engage in chemical dependency treatment again.   Past Psychiatric History: See above  Risk to Self:   Denies Risk to Others:   Denies Prior Inpatient Therapy:   [facility] 12/23, inpatient rehab? Prior Outpatient Therapy:   Denies  Past Medical History: No past medical history on file.  Family History: No family history on file. Family Psychiatric  History: Noncontributory Social History:  Social History   Substance and Sexual  Activity  Alcohol Use Not on file     Social History   Substance and Sexual Activity  Drug Use Not on file     Social History   Socioeconomic History   Marital status: Single    Spouse name: Not on file   Number of children: Not on file   Years of education: Not on file   Highest education level: Not on file  Occupational History   Not on file  Tobacco Use   Smoking status: Not on file   Smokeless tobacco: Not on file  Substance and Sexual Activity   Alcohol use: Not on file   Drug use: Not on file   Sexual activity: Not on file  Other Topics Concern   Not on file  Social History Narrative   Not on file   Social Determinants of Health   Financial Resource Strain: Not on file  Food Insecurity: Not on file  Transportation Needs: Not on file  Physical Activity: Not on file  Stress: Not on file  Social Connections: Not on file   Additional Social History:    Allergies:  Not on File  Labs:  Results for orders placed or performed during the hospital encounter of 09/10/22 (from the past 48 hour(s))  Urinalysis, Routine w reflex microscopic -Urine, Clean Catch     Status: Abnormal   Collection Time: 09/10/22  6:10 AM  Result Value Ref Range   Color, Urine AMBER (A) YELLOW    Comment: BIOCHEMICALS MAY BE AFFECTED BY COLOR   APPearance HAZY (A) CLEAR   Specific Gravity, Urine 1.033 (H) 1.005 - 1.030   pH 5.0 5.0 - 8.0   Glucose, UA NEGATIVE NEGATIVE mg/dL   Hgb urine dipstick NEGATIVE NEGATIVE   Bilirubin Urine NEGATIVE NEGATIVE   Ketones, ur 20 (A) NEGATIVE mg/dL   Protein, ur >=073 (A) NEGATIVE mg/dL   Nitrite NEGATIVE NEGATIVE   Leukocytes,Ua NEGATIVE NEGATIVE   RBC / HPF 6-10 0 - 5 RBC/hpf   WBC, UA 0-5 0 - 5 WBC/hpf   Bacteria, UA FEW (A) NONE SEEN   Squamous Epithelial / HPF 0-5 0 - 5 /HPF   Mucus PRESENT    Hyaline Casts, UA PRESENT    Sperm, UA PRESENT     Comment: Performed at [facility], [address], [place], [place] [address]  Urine rapid drug screen (hosp performed)     Status: Abnormal   Collection Time: 09/10/22  6:10 AM  Result Value Ref Range    Opiates NONE DETECTED NONE DETECTED   Cocaine NONE DETECTED NONE DETECTED   Benzodiazepines POSITIVE (A) NONE DETECTED   Amphetamines POSITIVE (A) NONE DETECTED   Tetrahydrocannabinol POSITIVE (A) NONE DETECTED   Barbiturates NONE DETECTED NONE DETECTED    Comment: (NOTE) DRUG SCREEN FOR MEDICAL PURPOSES ONLY.  IF CONFIRMATION IS NEEDED FOR ANY PURPOSE, NOTIFY LAB WITHIN 5 DAYS.  LOWEST DETECTABLE LIMITS FOR URINE DRUG SCREEN Drug Class                     Cutoff (ng/mL) Amphetamine and metabolites    1000 Barbiturate and metabolites    200 Benzodiazepine                 200 Opiates and metabolites        300 Cocaine and metabolites        300 THC  50 Performed at [facility], [address], [place], [place] [address]   CBC WITH DIFFERENTIAL     Status: Abnormal   Collection Time: 09/10/22  7:19 AM  Result Value Ref Range   WBC 28.0 (H) 4.0 - 10.5 K/uL   RBC 4.66 4.22 - 5.81 MIL/uL   Hemoglobin 13.4 13.0 - 17.0 g/dL   HCT 60.4 54.0 - 98.1 %   MCV 84.5 80.0 - 100.0 fL   MCH 28.8 26.0 - 34.0 pg   MCHC 34.0 30.0 - 36.0 g/dL   RDW 19.1 47.8 - 29.5 %   Platelets 305 150 - 400 K/uL   nRBC 0.0 0.0 - 0.2 %   Neutrophils Relative % 82 %   Neutro Abs 23.1 (H) 1.7 - 7.7 K/uL   Lymphocytes Relative 10 %   Lymphs Abs 2.7 0.7 - 4.0 K/uL   Monocytes Relative 7 %   Monocytes Absolute 2.0 (H) 0.1 - 1.0 K/uL   Eosinophils Relative 0 %   Eosinophils Absolute 0.0 0.0 - 0.5 K/uL   Basophils Relative 0 %   Basophils Absolute 0.1 0.0 - 0.1 K/uL   WBC Morphology TOXIC GRANULATION    Smear Review MORPHOLOGY UNREMARKABLE    Immature Granulocytes 1 %   Abs Immature Granulocytes 0.17 (H) 0.00 - 0.07 K/uL   Ovalocytes PRESENT     Comment: Performed at [facility], [address] [place], [place] [address]  Ammonia     Status: None   Collection Time: 09/10/22  7:19 AM  Result Value Ref Range   Ammonia 26 9 - 35 umol/L    Comment: Performed at [facility],  [address] [place], [place] [address]  Basic metabolic panel     Status: Abnormal   Collection Time: 09/10/22  7:19 AM  Result Value Ref Range   Sodium 139 135 - 145 mmol/L   Potassium 2.5 (LL) 3.5 - 5.1 mmol/L    Comment: CRITICAL RESULT CALLED TO, READ BACK BY AND VERIFIED WITH FOWLER,D AT 7:45AM ON 09/10/22 BY FESTERMAN,C   Chloride 102 98 - 111 mmol/L   CO2 24 22 - 32 mmol/L   Glucose, Bld 93 70 - 99 mg/dL    Comment: Glucose reference range applies only to samples taken after fasting for at least 8 hours.   BUN 18 6 - 20 mg/dL    Comment: QA FLAGS AND/OR RANGES MODIFIED BY DEMOGRAPHIC UPDATE ON 08/07 AT 1151   Creatinine, Ser 0.96 0.61 - 1.24 mg/dL   Calcium 8.9 8.9 - 46.9 mg/dL   GFR, Estimated 53 (L) >60 mL/min    Comment: (NOTE) Calculated using the CKD-EPI Creatinine Equation (2021)    Anion gap 13 5 - 15    Comment: Performed at [facility], [address], [place], [place] [address]  Hepatic function panel     Status: Abnormal   Collection Time: 09/10/22  7:19 AM  Result Value Ref Range   Total Protein 7.9 6.5 - 8.1 g/dL   Albumin 4.8 3.5 - 5.0 g/dL   AST 41 15 - 41 U/L   ALT 26 0 - 44 U/L   Alkaline Phosphatase 78 38 - 126 U/L   Total Bilirubin 1.6 (H) 0.3 - 1.2 mg/dL   Bilirubin, Direct 0.2 0.0 - 0.2 mg/dL   Indirect Bilirubin 1.4 (H) 0.3 - 0.9 mg/dL    Comment: Performed at [facility], [address] [place], [place] [address]  Ethanol     Status: None  Collection Time: 09/10/22  7:19 AM  Result Value Ref Range   Alcohol, Ethyl (B) <10 <10 mg/dL    Comment: (NOTE) Lowest detectable limit for serum alcohol is 10 mg/dL.  For medical purposes only. Performed at [facility], [address], [place], [place] [address]   Potassium     Status: Abnormal   Collection Time: 09/10/22 12:08 PM  Result Value Ref Range   Potassium 3.1 (L) 3.5 - 5.1 mmol/L    Comment: Performed at [facility], [address], [place], [place] [address]  CBC with Differential     Status:  Abnormal   Collection Time: 09/10/22 12:08 PM  Result Value Ref Range   WBC 23.5 (H) 4.0 - 10.5 K/uL   RBC 4.44 4.22 - 5.81 MIL/uL   Hemoglobin 12.9 (L) 13.0 - 17.0 g/dL   HCT 78.4 (L) 69.6 - 29.5 %   MCV 85.6 80.0 - 100.0 fL   MCH 29.1 26.0 - 34.0 pg   MCHC 33.9 30.0 - 36.0 g/dL   RDW 28.4 13.2 - 44.0 %   Platelets 276 150 - 400 K/uL   nRBC 0.0 0.0 - 0.2 %   Neutrophils Relative % 70 %   Neutro Abs 16.5 (H) 1.7 - 7.7 K/uL   Lymphocytes Relative 20 %   Lymphs Abs 4.7 (H) 0.7 - 4.0 K/uL   Monocytes Relative 8 %   Monocytes Absolute 1.9 (H) 0.1 - 1.0 K/uL   Eosinophils Relative 1 %   Eosinophils Absolute 0.1 0.0 - 0.5 K/uL   Basophils Relative 0 %   Basophils Absolute 0.1 0.0 - 0.1 K/uL   Immature Granulocytes 1 %   Abs Immature Granulocytes 0.11 (H) 0.00 - 0.07 K/uL    Comment: Performed at [facility], [address] [place], [place] [address]    Medications:  Current Facility-Administered Medications  Medication Dose Route Frequency Provider Last Rate Last Admin   acetaminophen (TYLENOL) tablet 650 mg  650 mg Oral Q6H PRN Pricilla Loveless, MD       diphenhydrAMINE (BENADRYL) capsule 50 mg  50 mg Oral BID PRN Ophelia Shoulder E, NP       ibuprofen (ADVIL) tablet 600 mg  600 mg Oral Q8H PRN Pricilla Loveless, MD   600 mg at 09/11/22 1230   OLANZapine (ZYPREXA) tablet 5 mg  5 mg Oral BID Vanetta Mulders, MD   5 mg at 09/11/22 0947   OLANZapine zydis (ZYPREXA) disintegrating tablet 5 mg  5 mg Oral QHS Ophelia Shoulder E, NP   5 mg at 09/10/22 2147   risperiDONE (RISPERDAL M-TABS) disintegrating tablet 1 mg  1 mg Oral BID Maryagnes Amos, FNP       No current outpatient medications on file.    Musculoskeletal: Strength & Muscle Tone: within normal limits Gait & Station: normal Patient leans: N/A          Psychiatric Specialty Exam:  Presentation  General Appearance:  Appropriate for Environment  Eye Contact: Fair  Speech: Pressured  Speech  Volume: Normal  Handedness: Right   Mood and Affect  Mood: Anxious  Affect: Congruent   Thought Process  Thought Processes: Irrevelant; Linear  Descriptions of Associations:Intact  Orientation:Full (Time, Place and Person)  Thought Content:Perseveration  History of Schizophrenia/Schizoaffective disorder:No  Duration of Psychotic Symptoms:Less than six months  Hallucinations:Hallucinations: Auditory Description of Auditory Hallucinations: common voices that have increased in intesnsity "they multiplied". Ive heard them before but try to drown them out.  Ideas of Reference:None  Suicidal Thoughts:Suicidal Thoughts: No  Homicidal Thoughts:Homicidal Thoughts: No   Sensorium  Memory: Immediate Poor; Recent Fair  Judgment: Poor  Insight: Shallow   Executive Functions  Concentration: Fair  Attention Span: Poor  Recall: [employer] of Knowledge: Fair  Language: Fair   Psychomotor Activity  Psychomotor Activity: Psychomotor Activity: Normal   Assets  Assets: Desire for Improvement; Communication Skills   Sleep  Sleep: Sleep: Fair    Physical Exam: Physical Exam Vitals and nursing note reviewed.  Constitutional:      Appearance: Normal appearance. He is normal weight.  Neurological:     Mental Status: He is alert and oriented to person, place, and time.  Psychiatric:        Attention and Perception: He is inattentive. He perceives auditory hallucinations.        Mood and Affect: Mood is anxious.        Speech: Speech is tangential.        Behavior: Behavior is hyperactive. Behavior is cooperative.        Thought Content: Thought content normal.        Cognition and Memory: Cognition normal.        Judgment: Judgment is impulsive.    ROS Blood pressure 118/67, pulse 74, temperature 98.5 F (36.9 C), resp. rate 18, SpO2 96%. There is no height or weight on file to calculate BMI.  Patient was brought into the emergency  department by police for psychiatric evaluation. 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 Today he is more alert and coherent, when compared to yesterdays evaluation.  Patient denied any depression or suicidal thoughts. Patient had no physical complaints. I do not feel that patient needs to be put under IVC commitment nor to have any emergent psychiatric evaluation. Patient states that he is wanting to go to [facility]. He did well in recovery after attending [facility]. .  Treatment Plan Summary: Medication management and Plan Start risperdal M tab 1mg  po BID Previously discharged from [facility] on 4mg  in December 2023.  -Continue agitation protocol.  EKG obtained shows Qtc to be WNL. If QTc is > 480 discontinue medication.  -TOC to facilitate referral to [facility].    Disposition:  Final disposition is pending. Hopeful he will improve to baseline after metabolization of substances. Maybe a good candidate for [facility] observation.  If not patient will need inpatient psychiatric hospitalization for treatment of ongoing hallucinations outside the metabolic clearance window.   This service was provided via telemedicine using a 2-way, interactive audio and video technology.  Names of all persons participating in this telemedicine service and their role in this encounter. Name: Caryn Bee Role: DNP  Name: Andy Gauss, Role: RN          Maryagnes Amos, FNP 09/11/2022 5:30 PM

## 2022-09-12 ENCOUNTER — Encounter (HOSPITAL_COMMUNITY): Payer: Self-pay | Admitting: Psychiatry

## 2022-09-12 DIAGNOSIS — R4182 Altered mental status, unspecified: Secondary | ICD-10-CM | POA: Diagnosis not present

## 2022-09-12 DIAGNOSIS — R456 Violent behavior: Secondary | ICD-10-CM | POA: Diagnosis not present

## 2022-09-12 DIAGNOSIS — F1010 Alcohol abuse, uncomplicated: Secondary | ICD-10-CM | POA: Diagnosis not present

## 2022-09-12 DIAGNOSIS — F333 Major depressive disorder, recurrent, severe with psychotic symptoms: Secondary | ICD-10-CM | POA: Diagnosis not present

## 2022-09-12 DIAGNOSIS — R440 Auditory hallucinations: Secondary | ICD-10-CM | POA: Diagnosis not present

## 2022-09-12 DIAGNOSIS — F1520 Other stimulant dependence, uncomplicated: Secondary | ICD-10-CM | POA: Diagnosis not present

## 2022-09-12 DIAGNOSIS — F1910 Other psychoactive substance abuse, uncomplicated: Secondary | ICD-10-CM | POA: Diagnosis not present

## 2022-09-12 DIAGNOSIS — F900 Attention-deficit hyperactivity disorder, predominantly inattentive type: Secondary | ICD-10-CM | POA: Diagnosis not present

## 2022-09-12 DIAGNOSIS — Z1152 Encounter for screening for COVID-19: Secondary | ICD-10-CM | POA: Diagnosis not present

## 2022-09-12 DIAGNOSIS — F419 Anxiety disorder, unspecified: Secondary | ICD-10-CM | POA: Diagnosis not present

## 2022-09-12 DIAGNOSIS — F17210 Nicotine dependence, cigarettes, uncomplicated: Secondary | ICD-10-CM | POA: Diagnosis not present

## 2022-09-12 DIAGNOSIS — E876 Hypokalemia: Secondary | ICD-10-CM | POA: Diagnosis not present

## 2022-09-12 DIAGNOSIS — Z5900 Homelessness unspecified: Secondary | ICD-10-CM | POA: Diagnosis not present

## 2022-09-12 LAB — SARS CORONAVIRUS 2 BY RT PCR: SARS Coronavirus 2 by RT PCR: NEGATIVE

## 2022-09-12 NOTE — ED Notes (Signed)
Attempted to call report

## 2022-09-12 NOTE — ED Provider Notes (Signed)
Emergency Medicine Observation Re-evaluation Note  Roger [name] is a 32 y.o. male, seen on rounds today.  Pt initially presented to the ED for complaints of Aggressive Behavior Currently, the patient is resting comfortably.  Physical Exam  BP 125/79 (BP Location: Left Arm)   Pulse 74   Temp 98.4 F (36.9 C) (Oral)   Resp 14   Ht 5\' 6"  (1.676 m)   Wt 79.4 kg   SpO2 100%   BMI 28.25 kg/m  Physical Exam Vitals and nursing note reviewed.  Constitutional:      General: He is not in acute distress.    Appearance: He is well-developed.  HENT:     Head: Normocephalic and atraumatic.  Eyes:     Conjunctiva/sclera: Conjunctivae normal.  Cardiovascular:     Rate and Rhythm: Normal rate and regular rhythm.     Heart sounds: No murmur heard. Pulmonary:     Effort: Pulmonary effort is normal. No respiratory distress.  Musculoskeletal:        General: No swelling.     Cervical back: Neck supple.  Skin:    General: Skin is warm and dry.  Neurological:     Mental Status: He is alert.  Psychiatric:        Mood and Affect: Mood normal.      ED Course / MDM  EKG:EKG Interpretation Date/Time:  Wednesday September 10 2022 06:04:11 EDT Ventricular Rate:  100 PR Interval:  137 QRS Duration:  100 QT Interval:  350 QTC Calculation: 452 R Axis:   54  Text Interpretation: Sinus tachycardia Probable inferior infarct, old No old tracing to compare Confirmed by Dione Booze ([id number]) on 09/11/2022 5:09:17 AM  I have reviewed the labs performed to date as well as medications administered while in observation.  Recent changes in the last 24 hours include TTS evaluation and excepted at inpatient facility.  Plan  Current plan is for transfer to inpatient psychiatric facility.    Glendora Score, MD 09/12/22 [phone]

## 2022-09-12 NOTE — ED Notes (Signed)
Report given to Thayer Ohm, RN at [facility]

## 2022-09-12 NOTE — Progress Notes (Signed)
Per Cassandra  with [facility] Intake informed that pt is fully accepted with the previous accepted information. Updated labs were accepted.    Pt was accepted to [facility] TOMORROW 09/12/2022; Bed Assignment Deatra Canter A   Pt meets inpatient criteria per Inez Catalina   Attending Physician will be Dr. Norton Pastel, MD   Report can be called to: - [phone] or [phone]   Pt can arrive after: 8:00am   Care Team notified: Arlean Hopping [facility] Guss Bunde, Brandy Emelia Loron, RN   Maryjean Ka, MSW, [facility] 09/12/2022 12:35 AM

## 2022-09-12 NOTE — ED Notes (Signed)
Attempted to call report. Facility not answering phone.

## 2022-09-17 DIAGNOSIS — F333 Major depressive disorder, recurrent, severe with psychotic symptoms: Secondary | ICD-10-CM | POA: Diagnosis not present

## 2022-10-21 DIAGNOSIS — F14280 Cocaine dependence with cocaine-induced anxiety disorder: Secondary | ICD-10-CM | POA: Diagnosis not present

## 2022-10-21 DIAGNOSIS — F330 Major depressive disorder, recurrent, mild: Secondary | ICD-10-CM | POA: Diagnosis not present

## 2022-10-21 DIAGNOSIS — F15180 Other stimulant abuse with stimulant-induced anxiety disorder: Secondary | ICD-10-CM | POA: Diagnosis not present

## 2022-10-30 DIAGNOSIS — Z5181 Encounter for therapeutic drug level monitoring: Secondary | ICD-10-CM | POA: Diagnosis not present

## 2022-10-30 DIAGNOSIS — Z79899 Other long term (current) drug therapy: Secondary | ICD-10-CM | POA: Diagnosis not present

## 2022-11-06 DIAGNOSIS — R462 Strange and inexplicable behavior: Secondary | ICD-10-CM | POA: Diagnosis not present

## 2022-11-06 DIAGNOSIS — Z743 Need for continuous supervision: Secondary | ICD-10-CM | POA: Diagnosis not present

## 2022-11-22 DIAGNOSIS — T7840XA Allergy, unspecified, initial encounter: Secondary | ICD-10-CM | POA: Diagnosis not present

## 2022-11-22 DIAGNOSIS — Z743 Need for continuous supervision: Secondary | ICD-10-CM | POA: Diagnosis not present

## 2022-12-13 DIAGNOSIS — Z743 Need for continuous supervision: Secondary | ICD-10-CM | POA: Diagnosis not present

## 2022-12-13 DIAGNOSIS — F1410 Cocaine abuse, uncomplicated: Secondary | ICD-10-CM | POA: Diagnosis not present

## 2022-12-13 DIAGNOSIS — R1110 Vomiting, unspecified: Secondary | ICD-10-CM | POA: Diagnosis not present

## 2023-01-03 DIAGNOSIS — Z743 Need for continuous supervision: Secondary | ICD-10-CM | POA: Diagnosis not present

## 2023-01-03 DIAGNOSIS — R462 Strange and inexplicable behavior: Secondary | ICD-10-CM | POA: Diagnosis not present

## 2023-04-24 IMAGING — CT CT HEAD W/O CM
4 series · 16 of 47 positions shown, 18 images · non-contrast
Comparison: CT 10/25/2014.

CLINICAL DATA: Mental status change, unknown cause



[Series 2: head wo · axial · 0.47mm/px · z∈[-120,+0]mm · 7 of 33 slices shown, 9 images]
[im 5/33  brain]
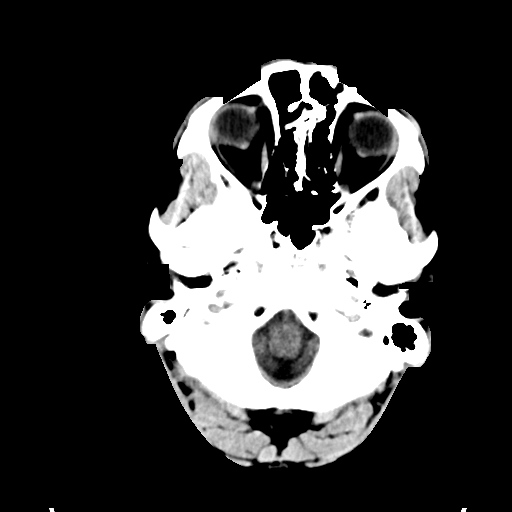
[im 5/33  bone]
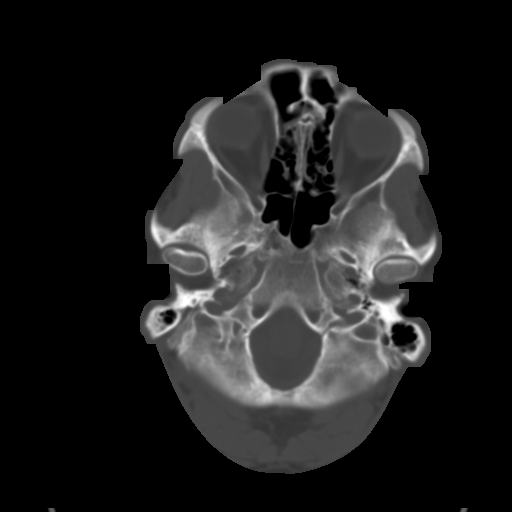
[im 9/33  brain]
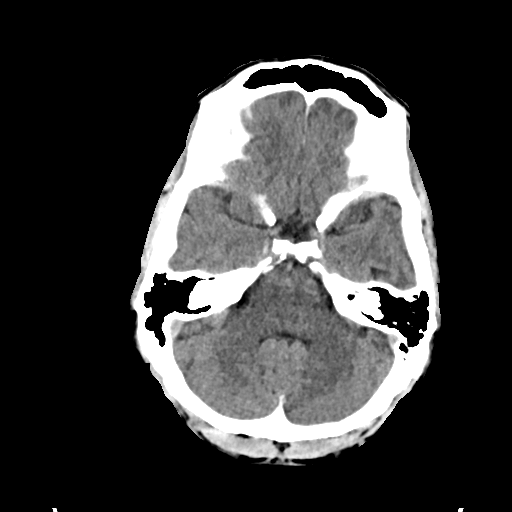
[im 13/33  brain]
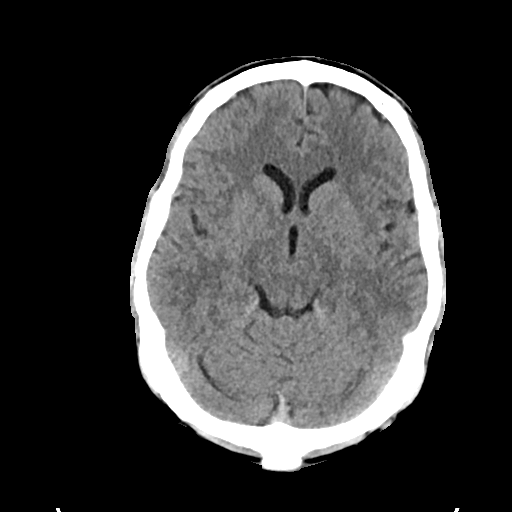
[im 17/33  brain]
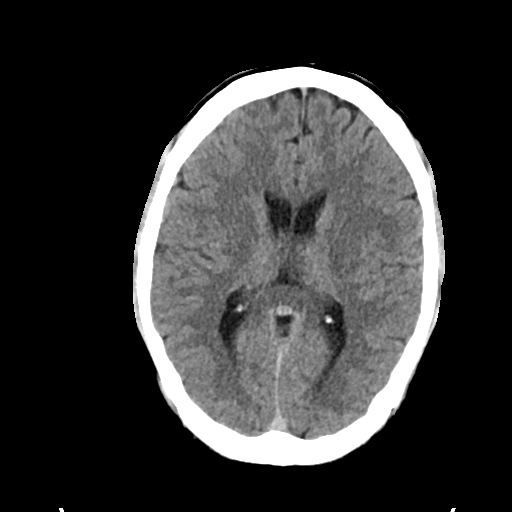
[im 21/33  brain]
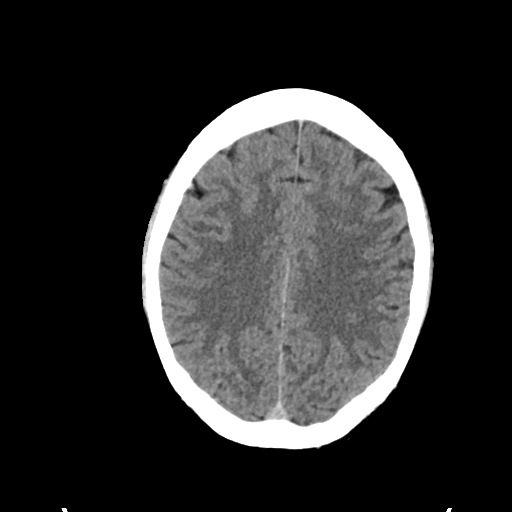
[im 21/33  bone]
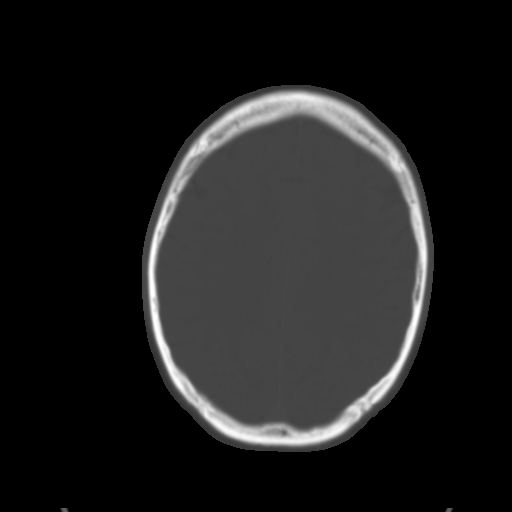
[im 25/33  brain]
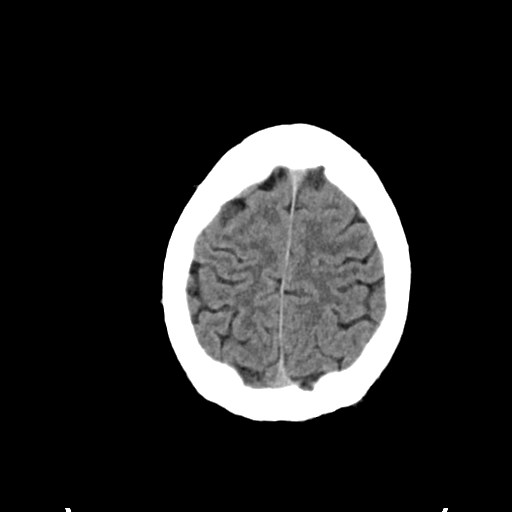
[im 29/33  brain]
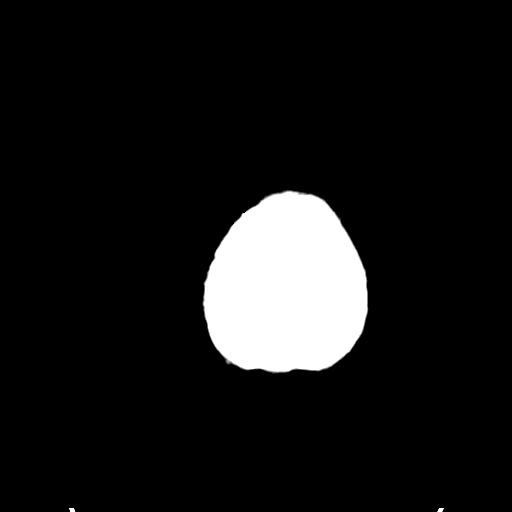

[Series 3: head bone · axial · 0.47mm/px · z∈[-124,-92]mm · 3 of 82 slices shown]
[im 9/82  bone]
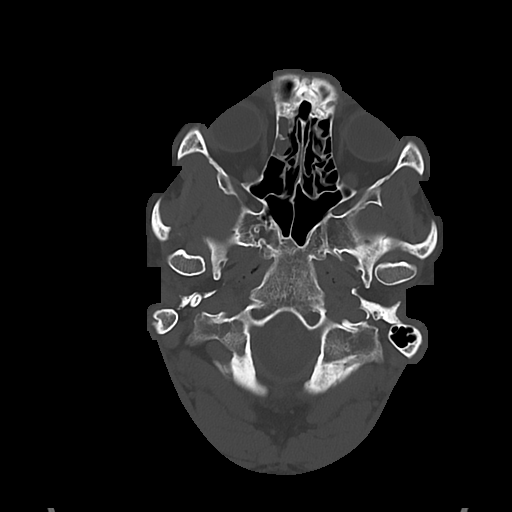
[im 17/82  bone]
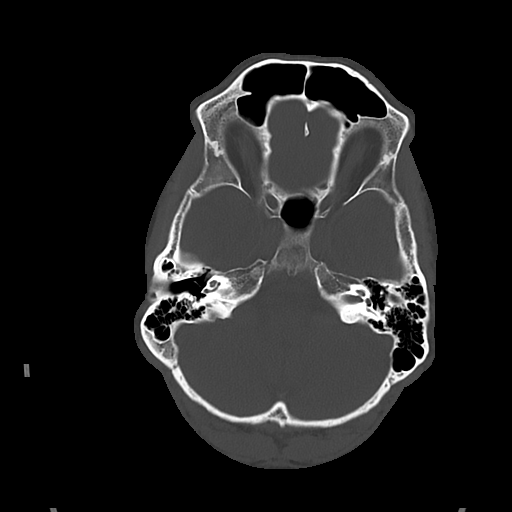
[im 25/82  bone]
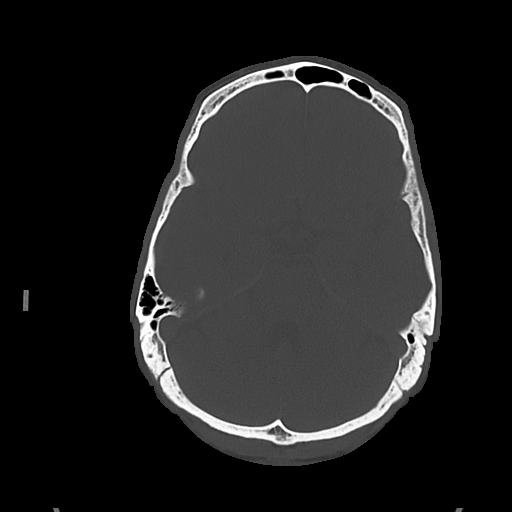

[Series 4: cor soft · coronal · 0.36mm/px · 3 of 78 slices shown]
[im 26/78  brain]
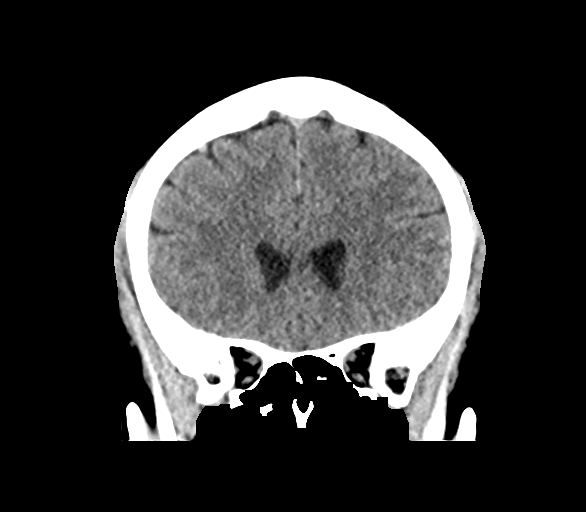
[im 35/78  brain]
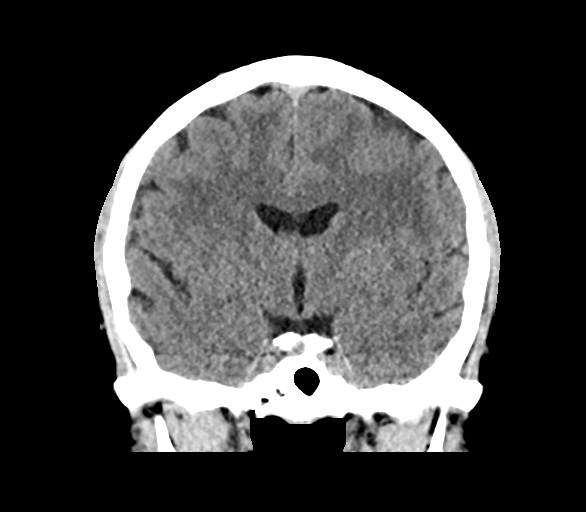
[im 43/78  brain]
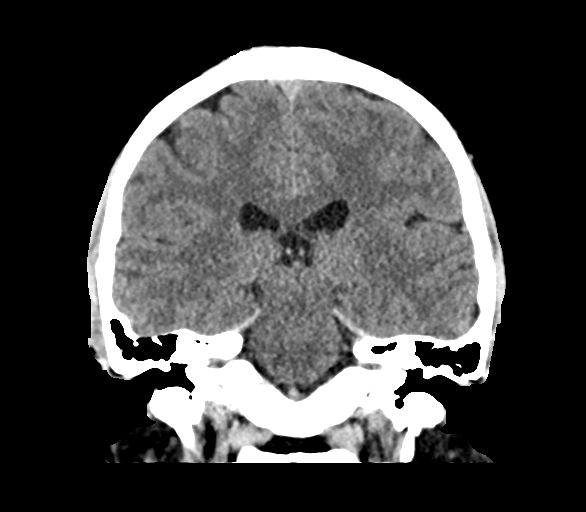

[Series 5: sag soft · sagittal · 0.31mm/px · 3 of 62 slices shown]
[im 21/62  brain]
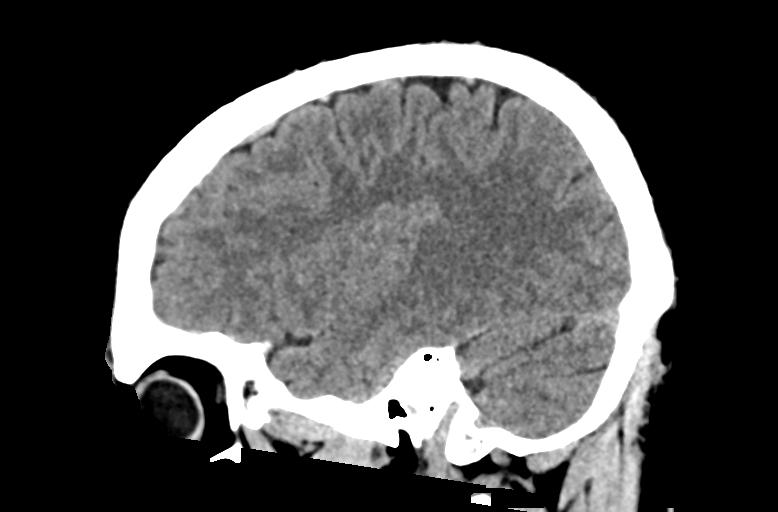
[im 31/62  brain]
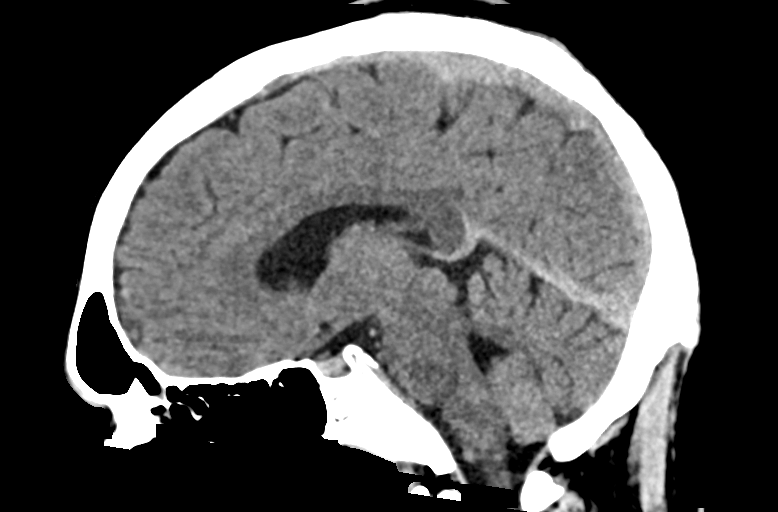
[im 41/62  brain]
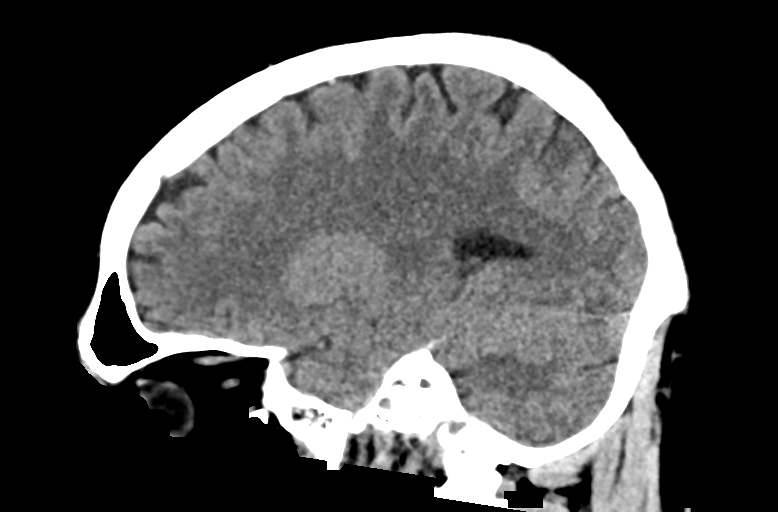

[16 of 47 positions shown; findings below may reference images not displayed]

FINDINGS: Brain: No evidence of acute infarction, hemorrhage, hydrocephalus,
extra-axial collection or mass lesion/mass effect.

Vascular: No hyperdense vessel identified.

Skull: No acute fracture.

Sinuses/Orbits: Minimal paranasal sinus mucosal thickening. No acute
orbital findings.

Other: No mastoid effusions.
IMPRESSION: No evidence of acute intracranial abnormality.

## 2023-10-14 ENCOUNTER — Emergency Department (HOSPITAL_COMMUNITY)
Admission: EM | Admit: 2023-10-14 | Discharge: 2023-10-14 | Disposition: A | Discharge status: Home / Self Care | Attending: Emergency Medicine | Admitting: Emergency Medicine

## 2023-10-14 ENCOUNTER — Encounter (HOSPITAL_COMMUNITY): Payer: Self-pay | Admitting: Emergency Medicine

## 2023-10-14 ENCOUNTER — Emergency Department (HOSPITAL_COMMUNITY)

## 2023-10-14 DIAGNOSIS — T50901A Poisoning by unspecified drugs, medicaments and biological substances, accidental (unintentional), initial encounter: Secondary | ICD-10-CM | POA: Diagnosis present

## 2023-10-14 DIAGNOSIS — X58XXXA Exposure to other specified factors, initial encounter: Secondary | ICD-10-CM | POA: Insufficient documentation

## 2023-10-14 DIAGNOSIS — R0789 Other chest pain: Secondary | ICD-10-CM | POA: Diagnosis not present

## 2023-10-14 DIAGNOSIS — R0781 Pleurodynia: Secondary | ICD-10-CM | POA: Diagnosis not present

## 2023-10-14 DIAGNOSIS — M546 Pain in thoracic spine: Secondary | ICD-10-CM | POA: Diagnosis not present

## 2023-10-14 MED ORDER — IBUPROFEN 600 MG PO TABS: 600.0000 mg | ORAL_TABLET | Freq: Four times a day (QID) | ORAL | 0 refills | Status: AC | PRN

## 2023-10-14 MED ORDER — IBUPROFEN 400 MG PO TABS
600.0000 mg | ORAL_TABLET | Freq: Once | ORAL | Status: AC
  Administered 2023-10-14: 600 mg via ORAL
  Filled 2023-10-14: qty 2

## 2023-10-14 NOTE — ED Notes (Signed)
 Pt arrives in handcuffs with law enforcement from facility at bedside. Pt calm and cooperative at this time.

## 2023-10-14 NOTE — ED Provider Notes (Signed)
 [place] EMERGENCY DEPARTMENT AT [facility]  Provider Note  CSN: [id number] Arrival date & time: 10/14/23 0126  History Chief Complaint  Patient presents with   Drug Overdose    Roger [name] is a 33 y.o. male brought to the ED via EMS from the [facility] work [facility] after being found unresponsive with a suspected overdose. He was given narcan  and woke up vomiting and combative. After he calmed down, he began complaining of R rib and thoracic back pain, worse with deep breath. He is unsure what happened and denies illicit drug use.    Home Medications Prior to Admission medications   Medication Sig Start Date End Date Taking? Authorizing Provider  ibuprofen  (ADVIL ) 600 MG tablet Take 1 tablet (600 mg total) by mouth every 6 (six) hours as needed. 10/14/23  Yes Roselyn Carlin NOVAK, MD  nicotine  (NICODERM CQ  - DOSED IN MG/24 HOURS) 14 mg/24hr patch Place 1 patch (14 mg total) onto the skin daily. Patient taking differently: Place 14 mg onto the skin daily. 02/05/22   Pashayan, Marsa RAMAN, DO  risperiDONE  (RISPERDAL ) 1 MG tablet Take 1 tablet (1 mg total) by mouth daily. 02/05/22   Raliegh Marsa RAMAN, DO  risperiDONE  (RISPERDAL ) 3 MG tablet Take 1 tablet (3 mg total) by mouth at bedtime. 02/04/22   Raliegh Marsa RAMAN, DO     Allergies    Patient has no known allergies.   Review of Systems   Review of Systems Please see HPI for pertinent positives and negatives  Physical Exam BP 101/65   Pulse (!) 47   Temp 97.8 F (36.6 C) (Oral)   Resp 15   Ht 5' 6 (1.676 m)   Wt 77.1 kg   SpO2 98%   BMI 27.44 kg/m   Physical Exam Vitals and nursing note reviewed.  Constitutional:      Appearance: Normal appearance.  HENT:     Head: Normocephalic and atraumatic.     Nose: Nose normal.     Mouth/Throat:     Mouth: Mucous membranes are moist.  Eyes:     Extraocular Movements: Extraocular movements intact.     Conjunctiva/sclera: Conjunctivae normal.   Cardiovascular:     Rate and Rhythm: Normal rate.  Pulmonary:     Effort: Pulmonary effort is normal.     Breath sounds: Normal breath sounds.  Chest:     Chest wall: Tenderness (R lateral chest wall) present.  Abdominal:     General: Abdomen is flat.     Palpations: Abdomen is soft.     Tenderness: There is no abdominal tenderness.  Musculoskeletal:        General: Tenderness (diffuse midline T spine) present. No swelling. Normal range of motion.     Cervical back: Neck supple.  Skin:    General: Skin is warm and dry.  Neurological:     General: No focal deficit present.     Mental Status: He is alert.  Psychiatric:        Mood and Affect: Mood normal.     ED Results / Procedures / Treatments   EKG None  Procedures Procedures  Medications Ordered in the ED Medications  ibuprofen  (ADVIL ) tablet 600 mg (has no administration in time range)    Initial Impression and Plan  Patient here after suspected unintentional overdose for evaluation of R rib and thoracic back pain. He is awake and alert, no signs of acute intoxication now. Exam is reassuring other than  tenderness in those areas. Will send for xrays. Otherwise vitals are normal. No hypoxia or respiratory distress.   ED Course   Clinical Course as of 10/14/23 0302  Wed Oct 14, 2023  0301 I personally viewed the images from radiology studies and agree with radiologist interpretation: Xrays are negative. Plan discharge back to facility with Rx for Motrin  as needed for pain. PCP follow up, RTED for any other concerns.   [CS]    Clinical Course User Index [CS] Roselyn Carlin NOVAK, MD     MDM Rules/Calculators/A&P Medical Decision Making Problems Addressed: Accidental drug overdose, initial encounter: acute illness or injury Chest wall pain: acute illness or injury  Amount and/or Complexity of Data Reviewed Radiology: ordered and independent interpretation performed. Decision-making details documented in ED  Course.  Risk Prescription drug management.     Final Clinical Impression(s) / ED Diagnoses Final diagnoses:  Accidental drug overdose, initial encounter  Chest wall pain    Rx / DC Orders ED Discharge Orders          Ordered    ibuprofen  (ADVIL ) 600 MG tablet  Every 6 hours PRN        10/14/23 0302             Roselyn Carlin NOVAK, MD 10/14/23 [phone]

## 2023-10-14 NOTE — ED Triage Notes (Signed)
 Pt arrives via EMS from [employer] after ingestion of unknown substance where pt had positive response to 1 dose of Narcan  on scene and vomited. Pt c/o back pain and pain with inspiration. Denies SI/HI.

## 2023-10-14 NOTE — ED Notes (Addendum)
 [facility] sitting with pt states they are awaiting staff from facility to pick them up as they arrived via ambulance

## 2023-11-08 ENCOUNTER — Emergency Department (HOSPITAL_COMMUNITY)
Admission: EM | Admit: 2023-11-08 | Discharge: 2023-11-08 | Disposition: A | Discharge status: Home / Self Care | Attending: Emergency Medicine | Admitting: Emergency Medicine

## 2023-11-08 ENCOUNTER — Encounter (HOSPITAL_COMMUNITY): Payer: Self-pay | Admitting: Emergency Medicine

## 2023-11-08 DIAGNOSIS — X500XXA Overexertion from strenuous movement or load, initial encounter: Secondary | ICD-10-CM | POA: Insufficient documentation

## 2023-11-08 DIAGNOSIS — M62830 Muscle spasm of back: Secondary | ICD-10-CM | POA: Diagnosis present

## 2023-11-08 MED ORDER — LIDOCAINE 5 % EX PTCH
2.0000 | MEDICATED_PATCH | CUTANEOUS | Status: DC
  Administered 2023-11-08: 2 via TRANSDERMAL
  Filled 2023-11-08: qty 2

## 2023-11-08 MED ORDER — NAPROXEN 500 MG PO TABS: 500.0000 mg | ORAL_TABLET | Freq: Two times a day (BID) | ORAL | 0 refills | Status: AC

## 2023-11-08 MED ORDER — METHOCARBAMOL 500 MG PO TABS: 500.0000 mg | ORAL_TABLET | Freq: Two times a day (BID) | ORAL | 0 refills | Status: AC
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
## 2023-11-08 NOTE — Discharge Instructions (Signed)
 Take naproxen  twice daily as prescribed. Take Robaxin  as it is prescribed for muscle spasms.  Do not drive or operate machinery while taking this medication. You can apply warm compresses to the sore muscles for 20 minutes at a time and followed with gentle stretching. You can apply over-the-counter lidocaine  patches to help with pain as well. Recheck with your primary care provider in the next 2 days.  If you do not have a primary care provider, please contact [place] and wellness. Return to the emergency room for worsening or concerning symptoms especially fever, loss of bowel or bladder control, numbness in your groin.

## 2023-11-08 NOTE — ED Provider Notes (Signed)
 [name]   CSN: [id number] Arrival date & time: 11/08/23  9087     Patient presents with: Back Pain   Roger [name] is a 33 y.o. male.   33 year old male presents with complaint of pain in his bottom for the past 2 days.  Patient states that he has been trying to move faster at work and has been doing a lot of heavy lifting and trying to lift quicker.  Pain is worse with movement as well as palpation of left and right back.  He denies any falls or injuries.  He denies fever, abdominal pain, saddle paresthesias, loss of bowel or bladder control or other complaints or concerns.  He does have a past medical history of IV drug abuse.       Prior to Admission medications   Medication Sig Start Date End Date Taking? Authorizing Provider  methocarbamol  (ROBAXIN ) 500 MG tablet Take 1 tablet (500 mg total) by mouth 2 (two) times daily. 11/08/23  Yes Beverley Leita LABOR, PA-C  naproxen  (NAPROSYN ) 500 MG tablet Take 1 tablet (500 mg total) by mouth 2 (two) times daily. 11/08/23  Yes Beverley Leita LABOR, PA-C  ibuprofen  (ADVIL ) 600 MG tablet Take 1 tablet (600 mg total) by mouth every 6 (six) hours as needed. 10/14/23   Roselyn Carlin NOVAK, MD  nicotine  (NICODERM CQ  - DOSED IN MG/24 HOURS) 14 mg/24hr patch Place 1 patch (14 mg total) onto the skin daily. Patient taking differently: Place 14 mg onto the skin daily. 02/05/22   Pashayan, Marsa RAMAN, DO  risperiDONE  (RISPERDAL ) 1 MG tablet Take 1 tablet (1 mg total) by mouth daily. 02/05/22   Pashayan, Marsa RAMAN, DO  risperiDONE  (RISPERDAL ) 3 MG tablet Take 1 tablet (3 mg total) by mouth at bedtime. 02/04/22   Raliegh Marsa RAMAN, DO    Allergies: Patient has no known allergies.    Review of Systems Negative except as per HPI Updated Vital Signs BP 129/69 (BP Location: Left Arm)   Pulse 62   Temp 98.7 F (37.1 C) (Oral)   Resp 16   Ht 5' 6 (1.676 m)   Wt 75.3 kg   SpO2 98%   BMI 26.79  kg/m   Physical Exam Vitals and nursing note reviewed.  Constitutional:      General: He is not in acute distress.    Appearance: He is well-developed. He is not diaphoretic.  HENT:     Head: Normocephalic and atraumatic.  Cardiovascular:     Pulses: Normal pulses.  Pulmonary:     Effort: Pulmonary effort is normal.  Musculoskeletal:        General: Tenderness present. No swelling.     Thoracic back: Tenderness present. No bony tenderness. Decreased range of motion.     Lumbar back: Tenderness present. No bony tenderness. Decreased range of motion.       Back:  Skin:    General: Skin is warm and dry.  Neurological:     Mental Status: He is alert and oriented to person, place, and time.     Sensory: No sensory deficit.     Motor: No weakness.     Gait: Gait normal.     Deep Tendon Reflexes:     Reflex Scores:      Patellar reflexes are 2+ on the right side and 2+ on the left side.    Comments: Equal plantar and dorsiflexion of feet.  Gait intact.  Psychiatric:  Behavior: Behavior normal.     (all labs ordered are listed, but only abnormal results are displayed) Labs Reviewed - No data to display  EKG: None  Radiology: No results found.   Procedures   Medications Ordered in the ED  lidocaine  (LIDODERM ) 5 % 2 patch (has no administration in time range)                                    Medical Decision Making Risk Prescription drug management.   33 year old male with complaint of back pain.  Found to have left and right lower back pain from the scapula down to the pelvis.  There is no midline or bony tenderness.  Gait is intact, reflexes are symmetric, strength symmetric.  He does not have fever, abdominal pain, saddle paresthesias or leg weakness.  He does have a history of IV drug abuse.  Considered epidural abscess, cauda equina, muscle strain.  Feel like his history of increased heavy lifting and trying to lift objects more quickly at work has led  to muscle spasms in his back.  Will manage with Lidoderm  patch in the emergency department.  Will discharge with Robaxin  and naproxen .  Recommend follow-up with PCP and referral provided to [facility].  We discussed reasons to return to the ER with patient who verbalizes understanding.     Final diagnoses:  Muscle spasm of back    ED Discharge Orders          Ordered    methocarbamol  (ROBAXIN ) 500 MG tablet  2 times daily        11/08/23 1027    naproxen  (NAPROSYN ) 500 MG tablet  2 times daily        11/08/23 1027               Beverley Leita DELENA DEVONNA 11/08/23 1031    Freddi Hamilton, MD 11/09/23 [phone]

## 2023-11-14 ENCOUNTER — Emergency Department (HOSPITAL_COMMUNITY)

## 2023-11-14 ENCOUNTER — Other Ambulatory Visit: Payer: Self-pay

## 2023-11-14 ENCOUNTER — Emergency Department (HOSPITAL_COMMUNITY)
Admission: EM | Admit: 2023-11-14 | Discharge: 2023-11-14 | Disposition: A | Discharge status: Home / Self Care | Attending: Emergency Medicine | Admitting: Emergency Medicine

## 2023-11-14 ENCOUNTER — Emergency Department (HOSPITAL_COMMUNITY)
Admission: EM | Admit: 2023-11-14 | Discharge: 2023-11-14 | Disposition: A | Discharge status: Home / Self Care | Payer: Self-pay | Source: Home / Self Care | Attending: Emergency Medicine | Admitting: Emergency Medicine

## 2023-11-14 ENCOUNTER — Encounter (HOSPITAL_COMMUNITY): Payer: Self-pay | Admitting: *Deleted

## 2023-11-14 DIAGNOSIS — F1910 Other psychoactive substance abuse, uncomplicated: Secondary | ICD-10-CM | POA: Insufficient documentation

## 2023-11-14 DIAGNOSIS — R799 Abnormal finding of blood chemistry, unspecified: Secondary | ICD-10-CM | POA: Diagnosis not present

## 2023-11-14 DIAGNOSIS — T50901A Poisoning by unspecified drugs, medicaments and biological substances, accidental (unintentional), initial encounter: Secondary | ICD-10-CM | POA: Diagnosis present

## 2023-11-14 DIAGNOSIS — T50904A Poisoning by unspecified drugs, medicaments and biological substances, undetermined, initial encounter: Secondary | ICD-10-CM

## 2023-11-14 DIAGNOSIS — X58XXXA Exposure to other specified factors, initial encounter: Secondary | ICD-10-CM | POA: Diagnosis not present

## 2023-11-14 DIAGNOSIS — R4182 Altered mental status, unspecified: Secondary | ICD-10-CM | POA: Insufficient documentation

## 2023-11-14 LAB — CBC WITH DIFFERENTIAL/PLATELET
Abs Immature Granulocytes: 0.08 K/uL — ABNORMAL HIGH (ref 0.00–0.07)
Basophils Absolute: 0 K/uL (ref 0.0–0.1)
Basophils Relative: 0 %
Eosinophils Absolute: 0.2 K/uL (ref 0.0–0.5)
Eosinophils Relative: 1 %
HCT: 42.9 % (ref 39.0–52.0)
Hemoglobin: 14.6 g/dL (ref 13.0–17.0)
Immature Granulocytes: 0 %
Lymphocytes Relative: 10 %
Lymphs Abs: 1.8 K/uL (ref 0.7–4.0)
MCH: 28.9 pg (ref 26.0–34.0)
MCHC: 34 g/dL (ref 30.0–36.0)
MCV: 84.8 fL (ref 80.0–100.0)
Monocytes Absolute: 1 K/uL (ref 0.1–1.0)
Monocytes Relative: 6 %
Neutro Abs: 15.1 K/uL — ABNORMAL HIGH (ref 1.7–7.7)
Neutrophils Relative %: 83 %
Platelets: 321 K/uL (ref 150–400)
RBC: 5.06 MIL/uL (ref 4.22–5.81)
RDW: 12.5 % (ref 11.5–15.5)
WBC: 18.1 K/uL — ABNORMAL HIGH (ref 4.0–10.5)
nRBC: 0 % (ref 0.0–0.2)

## 2023-11-14 LAB — COMPREHENSIVE METABOLIC PANEL WITH GFR
ALT: 33 U/L (ref 0–44)
AST: 42 U/L — ABNORMAL HIGH (ref 15–41)
Albumin: 5.1 g/dL — ABNORMAL HIGH (ref 3.5–5.0)
Alkaline Phosphatase: 106 U/L (ref 38–126)
Anion gap: 15 (ref 5–15)
BUN: 15 mg/dL (ref 6–20)
CO2: 25 mmol/L (ref 22–32)
Calcium: 9.7 mg/dL (ref 8.9–10.3)
Chloride: 102 mmol/L (ref 98–111)
Creatinine, Ser: 0.89 mg/dL (ref 0.61–1.24)
GFR, Estimated: >60 mL/min (ref >60)
Glucose, Bld: 92 mg/dL (ref 70–99)
Potassium: 3.8 mmol/L (ref 3.5–5.1)
Sodium: 142 mmol/L (ref 135–145)
Total Bilirubin: 1 mg/dL (ref 0.0–1.2)
Total Protein: 8.2 g/dL — ABNORMAL HIGH (ref 6.5–8.1)

## 2023-11-14 LAB — SALICYLATE LEVEL: Salicylate Lvl: <7 mg/dL — ABNORMAL LOW (ref 7.0–30.0)

## 2023-11-14 LAB — ETHANOL: Alcohol, Ethyl (B): <15 mg/dL (ref <15)

## 2023-11-14 LAB — ACETAMINOPHEN LEVEL: Acetaminophen (Tylenol), Serum: <10 ug/mL — ABNORMAL LOW (ref 10–30)

## 2023-11-14 LAB — CK: Total CK: 760 U/L — ABNORMAL HIGH (ref 49–397)

## 2023-11-14 LAB — CBG MONITORING, ED: Glucose-Capillary: 94 mg/dL (ref 70–99)

## 2023-11-14 MED ORDER — SODIUM CHLORIDE 0.9 % IV BOLUS
1000.0000 mL | Freq: Once | INTRAVENOUS | Status: AC
  Administered 2023-11-14: 1000 mL via INTRAVENOUS

## 2023-11-14 MED ORDER — LORAZEPAM 2 MG/ML IJ SOLN
2.0000 mg | Freq: Once | INTRAMUSCULAR | Status: DC
  Filled 2023-11-14: qty 1

## 2023-11-14 MED ORDER — HALOPERIDOL LACTATE 5 MG/ML IJ SOLN
5.0000 mg | Freq: Once | INTRAMUSCULAR | Status: AC
  Administered 2023-11-14: 5 mg via INTRAMUSCULAR
  Filled 2023-11-14: qty 1

## 2023-11-14 MED ORDER — LORAZEPAM 2 MG/ML IJ SOLN
2.0000 mg | Freq: Once | INTRAMUSCULAR | Status: AC
  Administered 2023-11-14: 2 mg via INTRAMUSCULAR

## 2023-11-14 NOTE — ED Notes (Signed)
Pt is on bair hugger

## 2023-11-14 NOTE — Discharge Instructions (Signed)
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
## 2023-11-14 NOTE — ED Notes (Signed)
 Pt in CT.

## 2023-11-14 NOTE — ED Notes (Signed)
 Resting/ sleeping, NAD, calm, VSS.

## 2023-11-14 NOTE — ED Notes (Signed)
 Pt calmer, behavior improved after haldol  and ativan . EDP [place] at [facility]. Remains restless, semi-cooperative, able to bargain, drinking water per request. Follows some commands, answers some questions.

## 2023-11-14 NOTE — ED Notes (Signed)
 CT notified that pt is ready for CT, resting, calm, NAD, VSS. Remains under bair hugger.

## 2023-11-14 NOTE — ED Triage Notes (Addendum)
 BIB [facility] from a home for overdose. Found in shower. Airway patent. Resistant to VS, care, triage. Delay in chart. [facility] chart to merge. See previous documentation. Pt seen by Dr. Midge EDP.   Arrival note from previous chart: Pt arrives via EMS after suspected overdose. Pt found unresponsive in shower. Downtime approx since 0200. Pt combative and uncooperative upon arrival, unable to gain IV access or vitals at this time. MD at bedside.

## 2023-11-14 NOTE — ED Notes (Signed)
 Bair hugger removed. Warm blankets remain.

## 2023-11-14 NOTE — ED Notes (Addendum)
 Remains mostly cooperative. Follows commands reluctantly. Preoccupied with sleep and comfort. Speech sometimes intelligible, mumbles. Calmer. Returned to monitor. Lawyer in place. Labs pending. CT pending. VSS.

## 2023-11-14 NOTE — ED Provider Notes (Signed)
.  Critical Care  Performed by: Midge Golas, MD Authorized by: Midge Golas, MD   Critical care provider statement:    Critical care time (minutes):  45   Critical care start time:  11/14/2023 6:55 AM   Critical care end time:  11/14/2023 7:40 AM   Critical care time was exclusive of:  Separately billable procedures and treating other patients   Critical care was necessary to treat or prevent imminent or life-threatening deterioration of the following conditions:  Toxidrome and CNS failure or compromise   Critical care was time spent personally by me on the following activities:  Examination of patient, evaluation of patient's response to treatment, pulse oximetry, ordering and review of laboratory studies and re-evaluation of patient's condition   I assumed direction of critical care for this patient from another provider in my specialty: no       Midge Golas, MD 11/14/23 [phone]

## 2023-11-14 NOTE — ED Notes (Signed)
 Back from CT, VSS, no changes. Sleeping. Lawyer in place. Warm IVF hung.
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
